# Patient Record
Sex: Male | Born: 1990
Health system: Southern US, Community
[De-identification: ages and names within clinical notes are randomized; demographics above are authoritative.]

## PROBLEM LIST (undated history)

## (undated) DIAGNOSIS — R002 Palpitations: Secondary | ICD-10-CM

## (undated) DIAGNOSIS — R110 Nausea: Secondary | ICD-10-CM

## (undated) DIAGNOSIS — F419 Anxiety disorder, unspecified: Secondary | ICD-10-CM

## (undated) DIAGNOSIS — I10 Essential (primary) hypertension: Secondary | ICD-10-CM

## (undated) DIAGNOSIS — J45909 Unspecified asthma, uncomplicated: Secondary | ICD-10-CM

## (undated) DIAGNOSIS — F32A Depression, unspecified: Secondary | ICD-10-CM

## (undated) DIAGNOSIS — R569 Unspecified convulsions: Secondary | ICD-10-CM

## (undated) DIAGNOSIS — F329 Major depressive disorder, single episode, unspecified: Secondary | ICD-10-CM

## (undated) HISTORY — DX: Morbid (severe) obesity due to excess calories: E66.01

## (undated) HISTORY — DX: Nausea: R11.0

## (undated) HISTORY — DX: Depression, unspecified: F32.A

## (undated) HISTORY — PX: TONSILLECTOMY AND ADENOIDECTOMY: SUR1326

## (undated) HISTORY — PX: CIRCUMCISION: SUR203

## (undated) HISTORY — DX: Major depressive disorder, single episode, unspecified: F32.9

## (undated) HISTORY — DX: Unspecified asthma, uncomplicated: J45.909

## (undated) HISTORY — DX: Essential (primary) hypertension: I10

---
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
## 2009-09-03 ENCOUNTER — Emergency Department (HOSPITAL_COMMUNITY): Admission: EM | Admit: 2009-09-03 | Discharge: 2009-09-03 | Payer: Self-pay | Admitting: Emergency Medicine

## 2012-05-12 DIAGNOSIS — W268XXA Contact with other sharp object(s), not elsewhere classified, initial encounter: Secondary | ICD-10-CM | POA: Insufficient documentation

## 2012-05-12 DIAGNOSIS — S91309A Unspecified open wound, unspecified foot, initial encounter: Secondary | ICD-10-CM | POA: Insufficient documentation

## 2012-05-13 ENCOUNTER — Emergency Department (HOSPITAL_COMMUNITY)
Admission: EM | Admit: 2012-05-13 | Discharge: 2012-05-13 | Disposition: A | Discharge status: Home / Self Care | Payer: Self-pay | Attending: Emergency Medicine | Admitting: Emergency Medicine

## 2012-05-13 ENCOUNTER — Encounter (HOSPITAL_COMMUNITY): Payer: Self-pay | Admitting: *Deleted

## 2012-05-13 DIAGNOSIS — S91339A Puncture wound without foreign body, unspecified foot, initial encounter: Secondary | ICD-10-CM

## 2012-05-13 MED ORDER — CIPROFLOXACIN HCL 500 MG PO TABS: 500.0000 mg | ORAL_TABLET | Freq: Two times a day (BID) | ORAL | Status: AC

## 2012-05-13 MED ORDER — CIPROFLOXACIN 500 MG/5ML (10%) PO SUSR: 500.0000 mg | Freq: Once | ORAL | Status: DC

## 2012-05-13 MED ORDER — BACITRACIN ZINC 500 UNIT/GM EX OINT
TOPICAL_OINTMENT | Freq: Once | CUTANEOUS | Status: AC
  Administered 2012-05-13: 01:00:00 via TOPICAL
  Filled 2012-05-13: qty 0.9

## 2012-05-13 MED ORDER — CIPROFLOXACIN HCL 250 MG PO TABS
500.0000 mg | ORAL_TABLET | Freq: Once | ORAL | Status: AC
  Administered 2012-05-13: 500 mg via ORAL
  Filled 2012-05-13: qty 2

## 2012-05-13 MED ORDER — TETANUS-DIPHTH-ACELL PERTUSSIS 5-2.5-18.5 LF-MCG/0.5 IM SUSP
0.5000 mL | Freq: Once | INTRAMUSCULAR | Status: AC
  Administered 2012-05-13: 0.5 mL via INTRAMUSCULAR
  Filled 2012-05-13: qty 0.5

## 2012-05-13 MED ORDER — IBUPROFEN 800 MG PO TABS: 800.0000 mg | ORAL_TABLET | Freq: Three times a day (TID) | ORAL | Status: AC

## 2012-05-13 NOTE — ED Provider Notes (Signed)
History     CSN: [id number]  Arrival date & time 05/12/12  2326   First MD Initiated Contact with Patient 05/13/12 0033      Chief Complaint  Patient presents with  . Puncture Wound    (Consider location/radiation/quality/duration/timing/severity/associated sxs/prior treatment) HPI History provided by patient. Tonight stepped on a rusty screw. He was wearing rubber is a tissues. Now complains of pain and swelling to bottom of his left foot. No foreign body sensation. It does not suspect retained foreign body. No numbness or tingling. Pain is sharp in quality. Nonradiating. Moderate in severity. No weakness or numbness. No other pain injury or trauma. No fevers chills. No nausea vomiting. No streaking redness. History reviewed. No pertinent past medical history.  History reviewed. No pertinent past surgical history.  History reviewed. No pertinent family history.  History  Substance Use Topics  . Smoking status: Never Smoker   . Smokeless tobacco: Not on file  . Alcohol Use: No      Review of Systems  Constitutional: Negative for fever and chills.  HENT: Negative for neck pain and neck stiffness.   Eyes: Negative for pain.  Respiratory: Negative for shortness of breath.   Cardiovascular: Negative for chest pain.  Gastrointestinal: Negative for abdominal pain.  Genitourinary: Negative for dysuria.  Musculoskeletal: Negative for back pain.  Skin: Positive for wound. Negative for rash.  Neurological: Negative for headaches.  All other systems reviewed and are negative.    Allergies  Review of patient's allergies indicates no known allergies.  Home Medications  No current outpatient prescriptions on file.  BP 191/103  Pulse 95  Temp 99.1 F (37.3 C) (Oral)  Resp 18  Ht 6\' 1"  (1.854 m)  Wt 300 lb (136.079 kg)  BMI 39.58 kg/m2  Physical Exam  Constitutional: He is oriented to person, place, and time. He appears well-developed and well-nourished.  HENT:  Head:  Normocephalic and atraumatic.  Eyes: Conjunctivae and EOM are normal. Pupils are equal, round, and reactive to light.  Neck: Trachea normal. Neck supple. No thyromegaly present.  Cardiovascular: Normal rate, regular rhythm, S1 normal, S2 normal and normal pulses.     No systolic murmur is present   No diastolic murmur is present  Pulses:      Radial pulses are 2+ on the right side, and 2+ on the left side.  Pulmonary/Chest: Effort normal and breath sounds normal. He has no wheezes. He has no rhonchi. He has no rales. He exhibits no tenderness.  Abdominal: Soft. Normal appearance and bowel sounds are normal. There is no tenderness. There is no CVA tenderness and negative Murphy's sign.  Musculoskeletal:       Left foot with puncture wound to arch of foot plantar aspect. There is no palpable or visualized foreign body. There is no streaking erythema. No purulent discharge. Distal neurovascular intact.  Neurological: He is alert and oriented to person, place, and time. He has normal strength. No cranial nerve deficit or sensory deficit. GCS eye subscore is 4. GCS verbal subscore is 5. GCS motor subscore is 6.  Skin: Skin is warm and dry. No rash noted. He is not diaphoretic.  Psychiatric: His speech is normal.       Cooperative and appropriate    ED Course  Procedures (including critical care time)  Puncture wound left foot.  Wound irrigated.   Tetanus updated. Bacitracin dressing applied. Cipro provided.Precautions verbalizes understood. Plan prescription for Cipro and followup 2 days for recheck.H. And notified his blood  pressure is elevated. Needs to be rechecked. Primary care referral provided.   MDM   Nursing notes reviewed. Old records reviewed. Vital signs reviewed.         Sunnie Nielsen, MD 05/13/12 [phone]

## 2012-05-13 NOTE — ED Notes (Signed)
Wound care completed. Pt tolerated well.

## 2012-05-13 NOTE — ED Notes (Signed)
Pt reports stepping on a nail previously this evening.  Reports edema began about 2 hours ago.

## 2013-04-01 ENCOUNTER — Encounter: Payer: Self-pay | Admitting: *Deleted

## 2013-07-08 ENCOUNTER — Ambulatory Visit: Payer: BC Managed Care – PPO

## 2013-07-08 ENCOUNTER — Ambulatory Visit: Payer: BC Managed Care – PPO | Admitting: Family Medicine

## 2013-07-08 VITALS — BP 142/84 | HR 114 | Temp 99.3°F | Resp 17 | Ht 71.5 in | Wt 366.0 lb

## 2013-07-08 DIAGNOSIS — J189 Pneumonia, unspecified organism: Secondary | ICD-10-CM

## 2013-07-08 DIAGNOSIS — R059 Cough, unspecified: Secondary | ICD-10-CM

## 2013-07-08 DIAGNOSIS — R05 Cough: Secondary | ICD-10-CM

## 2013-07-08 DIAGNOSIS — R0602 Shortness of breath: Secondary | ICD-10-CM

## 2013-07-08 DIAGNOSIS — R509 Fever, unspecified: Secondary | ICD-10-CM

## 2013-07-08 LAB — POCT CBC
Granulocyte percent: 62.8 %G (ref 37–80)
HCT, POC: 46.4 % (ref 43.5–53.7)
POC Granulocyte: 7.4 — AB (ref 2–6.9)
POC LYMPH PERCENT: 32 %L (ref 10–50)
Platelet Count, POC: 371 10*3/uL (ref 142–424)
RDW, POC: 13.7 %

## 2013-07-08 LAB — D-DIMER, QUANTITATIVE: D-Dimer, Quant: 0.39 ug/mL-FEU (ref 0.00–0.48)

## 2013-07-08 MED ORDER — LEVOFLOXACIN 500 MG PO TABS: 500.0000 mg | ORAL_TABLET | Freq: Every day | ORAL | Status: DC

## 2013-07-08 MED ORDER — IBUPROFEN 200 MG PO TABS: 400.0000 mg | ORAL_TABLET | Freq: Once | ORAL | Status: DC

## 2013-07-08 NOTE — Progress Notes (Signed)
[facility] [address], [place] [address] [phone]  Date:  07/08/2013   Name:  Omar Krause   DOB:  March 27, 1991   MRN:  [id number]  PCP:  No PCP Per Patient    Chief Complaint: Nasal Congestion and coughing up blood   History of Present Illness:  Omar Krause is a 22 y.o. very pleasant male patient who presents with the following:  He has noted a cough for about 10 days.  He is coughing up some mucus, had "cold like sx" when this started.  He now just had a cough, and has tasted blood when he coughs lately.  He has not seen any gross blood. He had not noted a fever but has noted sweating.   He did have flu like sx last week, but these seem to have passed.  He is having a hard time taking a deep breath since earlier today.  He did get worse today.  He had thought he was on the mend.    He is otherwise healthy- he did have asthma as a child but this no longer bothers him.   He was seen elsewhere and was tx with amoxicillin and a decongestant on 9/10- at that time he was dx with an ear infection.    He did take his amox and tylenol this am.   Student at [employer].   He has never been a smoker, no history of cocaine use, no history of CAD.  He does not have a family history of CAD, although his father does have HTN.  No history of PE  There are no active problems to display for this patient.   Past Medical History  Diagnosis Date  . Asthma   . Morbid obesity     Past Surgical History  Procedure Laterality Date  . Tonsillectomy and adenoidectomy      History  Substance Use Topics  . Smoking status: Never Smoker   . Smokeless tobacco: Not on file  . Alcohol Use: No    No family history on file.  Allergies  Allergen Reactions  . Ceftin [Cefuroxime Axetil]     Medication list has been reviewed and updated.  No current outpatient prescriptions on file prior to visit.   No current facility-administered medications on file prior to  visit.    Review of Systems:  As per HPI- otherwise negative.   Physical Examination: Filed Vitals:   07/08/13 0903  BP: 142/84  Pulse: 114  Temp: 99.3 F (37.4 C)  Resp: 17   Filed Vitals:   07/08/13 0903  Height: 5' 11.5" (1.816 m)  Weight: 366 lb (166.017 kg)   Body mass index is 50.34 kg/(m^2). Ideal Body Weight: Weight in (lb) to have BMI = 25: 181.4  GEN: WDWN, NAD, Non-toxic, A & O x 3, obese, sweating heavily HEENT: Atraumatic, Normocephalic. Neck supple. No masses, No LAD.  Bilateral TM wnl, oropharynx normal.  PEERL,EOMI.   Ears and Nose: No external deformity. CV: RRR, No M/G/R. No JVD. No thrill. No extra heart sounds. PULM: CTA B, no wheezes, crackles, rhonchi. No retractions. No resp. distress. No accessory muscle use. ABD: S, NT, ND. No rebound. No HSM. EXTR: No c/c/e NEURO Normal gait.  PSYCH: Normally interactive. Conversant. Not depressed or anxious appearing.  Calm demeanor.   [facility] reading (PRIMARY) by  Dr. Patsy Lager. CXR:  Right sided diffuse infiltrate  CHEST 2 VIEW  COMPARISON: PA and lateral chest 09/03/2009  and 12/21/2006.  FINDINGS: On the PA view, mild, patchy airspace opacity projects in the right mid lung zone. This is not seen on the lateral view. The left lung is clear. No pneumothorax or pleural fluid. Heart size is normal.  IMPRESSION: Mild, patchy airspace opacity in the right mid lung zone could be due to early pneumonia. Recommend followup films to clearing.  Results for orders placed in visit on 07/08/13  POCT CBC      Result Value Range   WBC 11.8 (*) 4.6 - 10.2 K/uL   Lymph, poc 3.8 (*) 0.6 - 3.4   POC LYMPH PERCENT 32.0  10 - 50 %L   MID (cbc) 0.6  0 - 0.9   POC MID % 5.2  0 - 12 %M   POC Granulocyte 7.4 (*) 2 - 6.9   Granulocyte percent 62.8  37 - 80 %G   RBC 5.08  4.69 - 6.13 M/uL   Hemoglobin 15.0  14.1 - 18.1 g/dL   HCT, POC 16.1  09.6 - 53.7 %   MCV 91.3  80 - 97 fL   MCH, POC 29.5  27 - 31.2 pg   MCHC 32.3   31.8 - 35.4 g/dL   RDW, POC 04.5     Platelet Count, POC 371  142 - 424 K/uL   MPV 8.3  0 - 99.8 fL   EKG:  NSR, tachycardia resolved.    Given 400mg  of ibuprofen.  Temp to 98.6, pulse in the 70's.  Sweating resolved Assessment and Plan: Cough - Plan: DG Chest 2 View  Fever, unspecified - Plan: POCT CBC, ibuprofen (ADVIL,MOTRIN) tablet 400 mg  Shortness of breath - Plan: DG Chest 2 View, EKG 12-Lead, D-dimer, quantitative  CAP (community acquired pneumonia) - Plan: levofloxacin (LEVAQUIN) 500 MG tablet, DG Chest 2 View  Here today with CAP.  Will also check a D dimer to rule- out PE given his SOB.  Treat with levaquin for 10 days, d/c amox.  Called and LMOM that d dimer is negative.  He will let me know if not better soon Ordered a repeat CXR for one month from now at [facility], which is near his home,    Signed Abbe Amsterdam, MD  Results for orders placed in visit on 07/08/13  D-DIMER, QUANTITATIVE      Result Value Range   D-Dimer, Quant 0.39  0.00 - 0.48 ug/mL-FEU  POCT CBC      Result Value Range   WBC 11.8 (*) 4.6 - 10.2 K/uL   Lymph, poc 3.8 (*) 0.6 - 3.4   POC LYMPH PERCENT 32.0  10 - 50 %L   MID (cbc) 0.6  0 - 0.9   POC MID % 5.2  0 - 12 %M   POC Granulocyte 7.4 (*) 2 - 6.9   Granulocyte percent 62.8  37 - 80 %G   RBC 5.08  4.69 - 6.13 M/uL   Hemoglobin 15.0  14.1 - 18.1 g/dL   HCT, POC 40.9  81.1 - 53.7 %   MCV 91.3  80 - 97 fL   MCH, POC 29.5  27 - 31.2 pg   MCHC 32.3  31.8 - 35.4 g/dL   RDW, POC 91.4     Platelet Count, POC 371  142 - 424 K/uL   MPV 8.3  0 - 99.8 fL

## 2013-07-08 NOTE — Patient Instructions (Addendum)
Let me know if you are not better in the next few days- Sooner if worse.   Keep an eye on your temperature. If you are running a fever please let me know I will call you regarding your D dimer test.    The radiologist did recommend that you have a follow-up CXR.  I will order this for you

## 2013-07-12 ENCOUNTER — Emergency Department (HOSPITAL_COMMUNITY): Payer: BC Managed Care – PPO

## 2013-07-12 ENCOUNTER — Emergency Department (HOSPITAL_COMMUNITY)
Admission: EM | Admit: 2013-07-12 | Discharge: 2013-07-12 | Disposition: A | Discharge status: Home / Self Care | Payer: BC Managed Care – PPO | Attending: Emergency Medicine | Admitting: Emergency Medicine

## 2013-07-12 ENCOUNTER — Encounter (HOSPITAL_COMMUNITY): Payer: Self-pay | Admitting: *Deleted

## 2013-07-12 DIAGNOSIS — J45909 Unspecified asthma, uncomplicated: Secondary | ICD-10-CM | POA: Insufficient documentation

## 2013-07-12 DIAGNOSIS — J189 Pneumonia, unspecified organism: Secondary | ICD-10-CM

## 2013-07-12 DIAGNOSIS — R51 Headache: Secondary | ICD-10-CM | POA: Insufficient documentation

## 2013-07-12 DIAGNOSIS — Z79899 Other long term (current) drug therapy: Secondary | ICD-10-CM | POA: Insufficient documentation

## 2013-07-12 DIAGNOSIS — J159 Unspecified bacterial pneumonia: Secondary | ICD-10-CM | POA: Insufficient documentation

## 2013-07-12 LAB — COMPREHENSIVE METABOLIC PANEL
ALT: 43 U/L (ref 0–53)
AST: 29 U/L (ref 0–37)
CO2: 24 mEq/L (ref 19–32)
Chloride: 101 mEq/L (ref 96–112)
GFR calc non Af Amer: >90 mL/min (ref >90)
Sodium: 136 mEq/L (ref 135–145)
Total Bilirubin: 0.2 mg/dL — ABNORMAL LOW (ref 0.3–1.2)

## 2013-07-12 LAB — CBC WITH DIFFERENTIAL/PLATELET
Basophils Absolute: 0 10*3/uL (ref 0.0–0.1)
HCT: 40.9 % (ref 39.0–52.0)
Lymphocytes Relative: 33 % (ref 12–46)
Neutro Abs: 7.4 10*3/uL (ref 1.7–7.7)
Neutrophils Relative %: 61 % (ref 43–77)
Platelets: 330 10*3/uL (ref 150–400)
RDW: 13.1 % (ref 11.5–15.5)
WBC: 12.3 10*3/uL — ABNORMAL HIGH (ref 4.0–10.5)

## 2013-07-12 MED ORDER — SODIUM CHLORIDE 0.9 % IV BOLUS (SEPSIS)
1000.0000 mL | Freq: Once | INTRAVENOUS | Status: AC
  Administered 2013-07-12: 1000 mL via INTRAVENOUS

## 2013-07-12 NOTE — ED Notes (Addendum)
Went to urgent care in [place] for cold/flu symptoms and was told he had pneumonia. Pt was instructed to come back in a few days if he wasn't feeling any better and the urgent care is closed today. Pt c/o headache and cough. Pt states he is stressed.

## 2013-07-12 NOTE — ED Provider Notes (Signed)
CSN: [id number]     Arrival date & time 07/12/13  1829 History   First MD Initiated Contact with Patient 07/12/13 1917     Chief Complaint  Patient presents with  . Headache  . Cough   (Consider location/radiation/quality/duration/timing/severity/associated sxs/prior Treatment) Patient is a 22 y.o. male presenting with cough. The history is provided by the patient (pt continues with cough.  has pneumonia.  txed since wed.).  Cough Cough characteristics:  Productive Sputum characteristics:  Nondescript Severity:  Moderate Onset quality:  Gradual Timing:  Constant Progression:  Waxing and waning Associated symptoms: no chest pain, no eye discharge, no headaches and no rash     Past Medical History  Diagnosis Date  . Asthma   . Morbid obesity    Past Surgical History  Procedure Laterality Date  . Tonsillectomy and adenoidectomy     No family history on file. History  Substance Use Topics  . Smoking status: Never Smoker   . Smokeless tobacco: Not on file  . Alcohol Use: No    Review of Systems  Constitutional: Negative for appetite change and fatigue.  HENT: Negative for congestion, sinus pressure and ear discharge.   Eyes: Negative for discharge.  Respiratory: Positive for cough.   Cardiovascular: Negative for chest pain.  Gastrointestinal: Negative for abdominal pain and diarrhea.  Genitourinary: Negative for frequency and hematuria.  Musculoskeletal: Negative for back pain.  Skin: Negative for rash.  Neurological: Negative for seizures and headaches.  Psychiatric/Behavioral: Negative for hallucinations.    Allergies  Ceftin  Home Medications   Current Outpatient Rx  Name  Route  Sig  Dispense  Refill  . esomeprazole (NEXIUM) 40 MG capsule   Oral   Take 40 mg by mouth daily before breakfast.         . levofloxacin (LEVAQUIN) 500 MG tablet   Oral   Take 1 tablet (500 mg total) by mouth daily.   10 tablet   0    BP 157/82  Pulse 89  Temp(Src) 98.7  F (37.1 C) (Oral)  Resp 24  Ht 6' (1.829 m)  Wt 350 lb (158.759 kg)  BMI 47.46 kg/m2  SpO2 99% Physical Exam  Constitutional: He is oriented to person, place, and time. He appears well-developed.  HENT:  Head: Normocephalic.  Eyes: Conjunctivae and EOM are normal. No scleral icterus.  Neck: Neck supple. No thyromegaly present.  Cardiovascular: Normal rate and regular rhythm.  Exam reveals no gallop and no friction rub.   No murmur heard. Pulmonary/Chest: No stridor. He has no wheezes. He has no rales. He exhibits no tenderness.  Abdominal: He exhibits no distension. There is no tenderness. There is no rebound.  Musculoskeletal: Normal range of motion. He exhibits no edema.  Lymphadenopathy:    He has no cervical adenopathy.  Neurological: He is oriented to person, place, and time. Coordination normal.  Skin: No rash noted. No erythema.  Psychiatric: He has a normal mood and affect. His behavior is normal.    ED Course  Procedures (including critical care time) Labs Review Labs Reviewed  CBC WITH DIFFERENTIAL - Abnormal; Notable for the following:    WBC 12.3 (*)    All other components within normal limits  COMPREHENSIVE METABOLIC PANEL - Abnormal; Notable for the following:    Total Bilirubin 0.2 (*)    All other components within normal limits   Imaging Review Dg Chest 2 View  07/12/2013   CLINICAL DATA:  Cough and headache  EXAM: CHEST  2 VIEW  COMPARISON:  July 08, 2013  FINDINGS: The area of subtle infiltrate on the right has cleared. Currently, lungs are clear. Heart size and pulmonary vascularity are normal. No adenopathy. No bone lesions.  IMPRESSION: No edema or consolidation.   Electronically Signed   By: Bretta Bang   On: 07/12/2013 19:31    MDM  Chest x-ray improving. 1. Community acquired pneumonia       Benny Lennert, MD 07/12/13 2110

## 2013-07-13 ENCOUNTER — Telehealth: Payer: Self-pay

## 2013-07-13 DIAGNOSIS — R05 Cough: Secondary | ICD-10-CM

## 2013-07-13 DIAGNOSIS — R059 Cough, unspecified: Secondary | ICD-10-CM

## 2013-07-13 MED ORDER — HYDROCODONE-HOMATROPINE 5-1.5 MG/5ML PO SYRP: 5.0000 mL | ORAL_SOLUTION | Freq: Three times a day (TID) | ORAL | Status: DC | PRN

## 2013-07-13 NOTE — Telephone Encounter (Signed)
Reviewed ED note from yesterday.  Pneumonia improving.  Called but had to [facility].  I assume he would like something to help with sleep as we discussed at our visit (at that time he declined rx).  Will call in hycodan- avoid driving due to sedation.  Let me know if not getting better

## 2013-07-13 NOTE — Telephone Encounter (Signed)
Pt of Dr.Copland, would like a cough syrup called in to the [facility] in [place]. Call at [phone].

## 2013-07-18 ENCOUNTER — Telehealth: Payer: Self-pay

## 2013-07-18 NOTE — Telephone Encounter (Signed)
Patient has completed antibiotics.  His throat feels swollen and it burns - wonders if he needs another round of medication.   [facility] [place] [facility]   [phone]

## 2013-07-19 ENCOUNTER — Ambulatory Visit: Payer: BC Managed Care – PPO | Admitting: Emergency Medicine

## 2013-07-19 ENCOUNTER — Encounter: Payer: Self-pay | Admitting: Emergency Medicine

## 2013-07-19 VITALS — BP 144/90 | HR 90 | Temp 97.6°F | Resp 22 | Ht 71.0 in | Wt 350.8 lb

## 2013-07-19 DIAGNOSIS — J309 Allergic rhinitis, unspecified: Secondary | ICD-10-CM

## 2013-07-19 DIAGNOSIS — G4733 Obstructive sleep apnea (adult) (pediatric): Secondary | ICD-10-CM

## 2013-07-19 DIAGNOSIS — J209 Acute bronchitis, unspecified: Secondary | ICD-10-CM

## 2013-07-19 MED ORDER — TRIAMCINOLONE ACETONIDE(NASAL) 55 MCG/ACT NA INHA: 2.0000 | Freq: Every day | NASAL | Status: DC

## 2013-07-19 MED ORDER — PSEUDOEPHEDRINE-GUAIFENESIN ER 60-600 MG PO TB12: 1.0000 | ORAL_TABLET | Freq: Two times a day (BID) | ORAL | Status: DC

## 2013-07-19 MED ORDER — HYDROCOD POLST-CHLORPHEN POLST 10-8 MG/5ML PO LQCR: 5.0000 mL | Freq: Two times a day (BID) | ORAL | Status: DC | PRN

## 2013-07-19 NOTE — Telephone Encounter (Signed)
Recheck.

## 2013-07-19 NOTE — Telephone Encounter (Signed)
We saw patient for pneumonia - if he is still having problems after his abx he needs a recheck.

## 2013-07-19 NOTE — Telephone Encounter (Signed)
Spoke with pt advised to RTC.

## 2013-07-19 NOTE — Progress Notes (Signed)
[facility] [address] [place] [address] [phone]  Date:  07/19/2013   Name:  Omar Krause   DOB:  20-Aug-1991   MRN:  [id number]  PCP:  No PCP Per Patient    Chief Complaint: Follow-up   History of Present Illness:  Omar Krause is a 22 y.o. very pleasant male patient who presents with the following:  Seen by Dr Patsy Lager and treated for [employer] after he came in with cough and some hemoptysis.  Following he wen to the ER for treatment and was given 2 liters of fluid for "dehydration".  No radiographic evidence for pneumonia.  No fever or chills.  Now to office with headache and feels "bad".  Has no wheezing or shortness of breath. No nausea or vomiting.  No nasal drainage but congestion.  Cough is not productive.  No sore throat. No further hemoptysis.  Denies other complaint or health concern today.  Never told that he snores.  Sleepy and fatigued on arising and has excessive daytime sleepiness.  Patient Active Problem List   Diagnosis Date Noted  . Morbid obesity 07/08/2013    Past Medical History  Diagnosis Date  . Asthma   . Morbid obesity     Past Surgical History  Procedure Laterality Date  . Tonsillectomy and adenoidectomy      History  Substance Use Topics  . Smoking status: Never Smoker   . Smokeless tobacco: Not on file  . Alcohol Use: No    History reviewed. No pertinent family history.  Allergies  Allergen Reactions  . Ceftin [Cefuroxime Axetil] Itching and Other (See Comments)    Medication list has been reviewed and updated.  Current Outpatient Prescriptions on File Prior to Visit  Medication Sig Dispense Refill  . esomeprazole (NEXIUM) 40 MG capsule Take 40 mg by mouth daily before breakfast.      . HYDROcodone-homatropine (HYCODAN) 5-1.5 MG/5ML syrup Take 5 mLs by mouth every 8 (eight) hours as needed for cough.  90 mL  0  . levofloxacin (LEVAQUIN) 500 MG tablet Take 1 tablet (500 mg total) by mouth daily.  10 tablet   0   Current Facility-Administered Medications on File Prior to Visit  Medication Dose Route Frequency Provider Last Rate Last Dose  . ibuprofen (ADVIL,MOTRIN) tablet 400 mg  400 mg Oral Once Pearline Cables, MD        Review of Systems:  As per HPI, otherwise negative.    Physical Examination: Filed Vitals:   07/19/13 1548  BP: 144/90  Pulse: 90  Temp: 97.6 F (36.4 C)  Resp: 22   Filed Vitals:   07/19/13 1548  Height: 5\' 11"  (1.803 m)  Weight: 350 lb 12.8 oz (159.122 kg)   Body mass index is 48.95 kg/(m^2). Ideal Body Weight: Weight in (lb) to have BMI = 25: 178.9  GEN: morbidly obese, NAD, Non-toxic, A & O x 3  Sweating.   HEENT: Atraumatic, Normocephalic. Neck supple. No masses, No LAD. Ears and Nose: No external deformity. CV: RRR, No M/G/R. No JVD. No thrill. No extra heart sounds. PULM: CTA B, no wheezes, crackles, rhonchi. No retractions. No resp. distress. No accessory muscle use. ABD: S, NT, ND, +BS. No rebound. No HSM. EXTR: No c/c/e NEURO Normal gait.  PSYCH: Normally interactive. Conversant. Not depressed or anxious appearing.  Calm demeanor.    Assessment and Plan: Bronchitis Seasonal allergic rhinitis Obstructive sleep apnea  Signed,  Phillips Odor, MD

## 2013-07-19 NOTE — Patient Instructions (Addendum)

## 2014-01-19 ENCOUNTER — Ambulatory Visit: Payer: BC Managed Care – PPO

## 2014-01-19 ENCOUNTER — Ambulatory Visit (INDEPENDENT_AMBULATORY_CARE_PROVIDER_SITE_OTHER): Payer: BC Managed Care – PPO | Admitting: Family Medicine

## 2014-01-19 VITALS — BP 160/100 | HR 93 | Temp 98.0°F | Resp 20 | Ht 71.75 in | Wt 365.0 lb

## 2014-01-19 DIAGNOSIS — R059 Cough, unspecified: Secondary | ICD-10-CM

## 2014-01-19 DIAGNOSIS — J029 Acute pharyngitis, unspecified: Secondary | ICD-10-CM

## 2014-01-19 DIAGNOSIS — R05 Cough: Secondary | ICD-10-CM

## 2014-01-19 DIAGNOSIS — J020 Streptococcal pharyngitis: Secondary | ICD-10-CM

## 2014-01-19 LAB — POCT RAPID STREP A (OFFICE): RAPID STREP A SCREEN: POSITIVE — AB

## 2014-01-19 MED ORDER — AZITHROMYCIN 250 MG PO TABS: ORAL_TABLET | ORAL | Status: DC

## 2014-01-19 NOTE — Progress Notes (Signed)
[facility] [address], [place] [id number] [phone]  Date:  01/19/2014   Name:  Omar Krause   DOB:  May 28, 1991   MRN:  [id number]  PCP:  No PCP Per Patient    Chief Complaint: Cough and Chest Congestion   History of Present Illness:  Omar Krause is a 23 y.o. very pleasant male patient who presents with the following:  Had cold symptoms last week that resolved but this morning had recurrent cough, nasal congestion and sore throat. Cough is productive of white phlegm. He has had nasal congestion making it difficult to breathe just through his nose. He denies any shortness of breath. He denies any fevers but has been having chills. No sick contacts. Take alka seltzer flu which has not helped much. He is eating and drinking well. No myalgias. No headache.  Patient was diagnosed with pneumonia in sep 2014 and feels like his current symptoms are similar to then.   Patient Active Problem List   Diagnosis Date Noted  . Morbid obesity 07/08/2013    Past Medical History  Diagnosis Date  . Asthma   . Morbid obesity     Past Surgical History  Procedure Laterality Date  . Tonsillectomy and adenoidectomy      History  Substance Use Topics  . Smoking status: Never Smoker   . Smokeless tobacco: Not on file  . Alcohol Use: No    History reviewed. No pertinent family history.  Allergies  Allergen Reactions  . Ceftin [Cefuroxime Axetil] Itching and Other (See Comments)    Medication list has been reviewed and updated.  Current Outpatient Prescriptions on File Prior to Visit  Medication Sig Dispense Refill  . chlorpheniramine-HYDROcodone (TUSSIONEX PENNKINETIC ER) 10-8 MG/5ML LQCR Take 5 mLs by mouth every 12 (twelve) hours as needed.  60 mL  0  . esomeprazole (NEXIUM) 40 MG capsule Take 40 mg by mouth daily before breakfast.      . HYDROcodone-homatropine (HYCODAN) 5-1.5 MG/5ML syrup Take 5 mLs by mouth every 8 (eight) hours as needed for  cough.  90 mL  0  . levofloxacin (LEVAQUIN) 500 MG tablet Take 1 tablet (500 mg total) by mouth daily.  10 tablet  0  . pseudoephedrine-guaifenesin (MUCINEX D) 60-600 MG per tablet Take 1 tablet by mouth every 12 (twelve) hours.  18 tablet  0  . triamcinolone (NASACORT AQ) 55 MCG/ACT nasal inhaler Place 2 sprays into the nose daily.  1 Inhaler  12   Current Facility-Administered Medications on File Prior to Visit  Medication Dose Route Frequency Provider Last Rate Last Dose  . ibuprofen (ADVIL,MOTRIN) tablet 400 mg  400 mg Oral Once Pearline CablesJessica C Copland, MD        Review of Systems:  Review of Systems - General ROS: positive for chills. No myalgias, no documented fevers.  ENT ROS: positive for - nasal congestion and nasal discharge, no headache, no ear pain Respiratory ROS: positive for - cough and wheezing, negative for shortness of breath Cardiovascular ROS: no chest pain or dyspnea on exertion Gastrointestinal ROS: no abdominal pain, change in bowel habits, or black or bloody stools Dermatological ROS: negative   Physical Examination: Filed Vitals:   01/19/14 1025  BP: 168/92  Pulse: 93  Temp: 98 F (36.7 C)  Resp: 20   Filed Vitals:   01/19/14 1025  Height: 5' 11.75" (1.822 m)  Weight: 365 lb (165.563 kg)   Body mass index is 49.87 kg/(m^2). Ideal  Body Weight: Weight in (lb) to have BMI = 25: 182.7  Physical Examination: General appearance - alert, well appearing, and in no distress, obese Eyes - pupils equal and reactive, extraocular eye movements intact Ears - right TM red and inflamed with some bulging, left TM normal Nose - erythematous and congested nasal turbinates bilaterally, no sinus tenderness Mouth - moist mucous membranes, no tonsilar exudates, post nasal drip changes in posterior oropharynx Neck - supple, no significant adenopathy Chest - normal work of breathing, scattered crackles in lower bases, no wheezing Heart - normal rate, regular rhythm, normal S1,  S2, no murmurs, rubs, clicks or gallops Abdomen - soft, nontender, nondistended, no masses or organomegaly Skin - normal coloration and turgor, no rashes, no suspicious skin lesions noted  [facility] reading (PRIMARY) by  Dr. Patsy Lager. CXR: negative  CHEST 2 VIEW  COMPARISON: 07/12/2013  FINDINGS: The heart size and mediastinal contours are within normal limits. Both lungs are clear. The visualized skeletal structures are unremarkable.  IMPRESSION: Normal chest radiographs.  Results for orders placed in visit on 01/19/14  POCT RAPID STREP A (OFFICE)      Result Value Ref Range   Rapid Strep A Screen Positive (*) Negative    Assessment and Plan: Streptococcal sore throat - Plan: azithromycin (ZITHROMAX) 250 MG tablet  Cough - Plan: DG Chest 2 View  Acute pharyngitis - Plan: POCT rapid strep A   23 yo morbidly obese male who presents with cough, sore throat and congestion for one day.  - otitis media: exam consistent with otitis media of right TM.  - sore throat: strep test positive. Will treat with azithromycin for strep and otitis media given patient's allergy to ceftin.  - cough and congestion: CXR negative for PNA. - HTN: patient hesitant to start meds at this time.  He is able to check his BP at home. Asked him to monitor BP a few times a week over the next few weeks. Patient to call if BP greater than 140/85. If persistently high, would start lisinopril.    Marena Chancy, PGY-3 Family Medicine Resident  Signed Abbe Amsterdam, MD

## 2014-01-19 NOTE — Patient Instructions (Signed)
We are going to treat you for strep throat with azithromycin- if you do not feel better in the next couple of days please let me know.    Please check you BP at home; give me a call in a couple of weeks with an update.  We want you to run around 130/85 of less, if you continue to run higher we will need to think about medication.

## 2014-01-25 ENCOUNTER — Telehealth: Payer: Self-pay

## 2014-01-25 NOTE — Telephone Encounter (Signed)
PT STATES HIS THROAT IS FEELING BETTER BUT NOW HIS EARS ARE SO STOPPED UP, DIDN'T KNOW IF HE NEEDED SOMETHING CALLED IN  PLEASE CALL PT AT [phone]    [facility] [place]

## 2014-01-26 MED ORDER — FLUTICASONE PROPIONATE 50 MCG/ACT NA SUSP: 2.0000 | Freq: Every day | NASAL | Status: DC

## 2014-01-26 MED ORDER — PSEUDOEPHEDRINE HCL 60 MG PO TABS: 60.0000 mg | ORAL_TABLET | Freq: Four times a day (QID) | ORAL | Status: DC | PRN

## 2014-01-26 NOTE — Telephone Encounter (Signed)
What can he take for his ears

## 2014-01-26 NOTE — Telephone Encounter (Signed)
i have sent sudafed and flonase to his pharmacy to help with ear pressure

## 2014-01-27 NOTE — Telephone Encounter (Signed)
Spoke with pt and told him the rx's were at the pharmayc

## 2014-01-27 NOTE — Telephone Encounter (Signed)
Called pt and voicemail is not set up so could not leave message to call back

## 2014-02-01 ENCOUNTER — Telehealth: Payer: Self-pay

## 2014-02-01 NOTE — Telephone Encounter (Signed)
Left message for call back Non-identifiable   Tdap- 05/13/2012

## 2014-02-02 ENCOUNTER — Ambulatory Visit (INDEPENDENT_AMBULATORY_CARE_PROVIDER_SITE_OTHER): Payer: BC Managed Care – PPO | Admitting: Family Medicine

## 2014-02-02 ENCOUNTER — Encounter: Payer: Self-pay | Admitting: Family Medicine

## 2014-02-02 DIAGNOSIS — R110 Nausea: Secondary | ICD-10-CM

## 2014-02-02 DIAGNOSIS — I10 Essential (primary) hypertension: Secondary | ICD-10-CM | POA: Insufficient documentation

## 2014-02-02 DIAGNOSIS — R112 Nausea with vomiting, unspecified: Secondary | ICD-10-CM | POA: Insufficient documentation

## 2014-02-02 DIAGNOSIS — F411 Generalized anxiety disorder: Secondary | ICD-10-CM

## 2014-02-02 LAB — CBC WITH DIFFERENTIAL/PLATELET
BASOS ABS: 0.4 10*3/uL — AB (ref 0.0–0.1)
Basophils Relative: 3.6 % — ABNORMAL HIGH (ref 0.0–3.0)
EOS ABS: 0.2 10*3/uL (ref 0.0–0.7)
Eosinophils Relative: 1.9 % (ref 0.0–5.0)
HCT: 42.9 % (ref 39.0–52.0)
Hemoglobin: 14.5 g/dL (ref 13.0–17.0)
LYMPHS PCT: 38 % (ref 12.0–46.0)
Lymphs Abs: 4.1 10*3/uL — ABNORMAL HIGH (ref 0.7–4.0)
MCHC: 33.8 g/dL (ref 30.0–36.0)
MCV: 86.3 fl (ref 78.0–100.0)
Monocytes Absolute: 0.6 10*3/uL (ref 0.1–1.0)
Monocytes Relative: 5.2 % (ref 3.0–12.0)
Neutro Abs: 5.6 10*3/uL (ref 1.4–7.7)
Neutrophils Relative %: 51.3 % (ref 43.0–77.0)
Platelets: 350 10*3/uL (ref 150.0–400.0)
RBC: 4.97 Mil/uL (ref 4.22–5.81)
RDW: 13.4 % (ref 11.5–14.6)
WBC: 10.9 10*3/uL — ABNORMAL HIGH (ref 4.5–10.5)

## 2014-02-02 LAB — HEMOGLOBIN A1C: HEMOGLOBIN A1C: 5.8 % (ref 4.6–6.5)

## 2014-02-02 LAB — HEPATIC FUNCTION PANEL
ALK PHOS: 64 U/L (ref 39–117)
ALT: 48 U/L (ref 0–53)
AST: 32 U/L (ref 0–37)
Albumin: 4.1 g/dL (ref 3.5–5.2)
BILIRUBIN DIRECT: 0 mg/dL (ref 0.0–0.3)
TOTAL PROTEIN: 7.8 g/dL (ref 6.0–8.3)
Total Bilirubin: 0.3 mg/dL (ref 0.3–1.2)

## 2014-02-02 LAB — LIPID PANEL
CHOLESTEROL: 191 mg/dL (ref 0–200)
HDL: 26.4 mg/dL — AB (ref >39.00)
LDL Cholesterol: 125 mg/dL — ABNORMAL HIGH (ref 0–99)
Total CHOL/HDL Ratio: 7
Triglycerides: 198 mg/dL — ABNORMAL HIGH (ref 0.0–149.0)
VLDL: 39.6 mg/dL (ref 0.0–40.0)

## 2014-02-02 LAB — BASIC METABOLIC PANEL
BUN: 9 mg/dL (ref 6–23)
CO2: 25 meq/L (ref 19–32)
Calcium: 9.2 mg/dL (ref 8.4–10.5)
Chloride: 104 mEq/L (ref 96–112)
Creatinine, Ser: 0.7 mg/dL (ref 0.4–1.5)
GFR: 145.38 mL/min (ref >60.00)
Glucose, Bld: 67 mg/dL — ABNORMAL LOW (ref 70–99)
Potassium: 3.4 mEq/L — ABNORMAL LOW (ref 3.5–5.1)
SODIUM: 138 meq/L (ref 135–145)

## 2014-02-02 LAB — TSH: TSH: 0.8 u[IU]/mL (ref 0.35–5.50)

## 2014-02-02 LAB — H. PYLORI ANTIBODY, IGG: H Pylori IgG: NEGATIVE

## 2014-02-02 MED ORDER — FLUOXETINE HCL 20 MG PO TABS: 20.0000 mg | ORAL_TABLET | Freq: Every day | ORAL | Status: DC

## 2014-02-02 MED ORDER — LOSARTAN POTASSIUM 50 MG PO TABS: 50.0000 mg | ORAL_TABLET | Freq: Every day | ORAL | Status: DC

## 2014-02-02 MED ORDER — PANTOPRAZOLE SODIUM 40 MG PO TBEC: 40.0000 mg | DELAYED_RELEASE_TABLET | Freq: Every day | ORAL | Status: DC

## 2014-02-02 NOTE — Assessment & Plan Note (Signed)
New to provider, ongoing for pt.  Stressed need for healthy diet and regular exercise to lose weight.  Pt is not interested or concerned at this time.  Check labs to risk stratify.  Will follow.

## 2014-02-02 NOTE — Assessment & Plan Note (Signed)
New.  This is pt's 2nd elevated reading.  + family hx.  Asymptomatic.  Check labs.  Start BP meds daily.  Reviewed supportive care and red flags that should prompt return.  Pt expressed understanding and is in agreement w/ plan.

## 2014-02-02 NOTE — Assessment & Plan Note (Signed)
New to provider, ongoing for pt.  Suspect there is a component of GERD.  Start PPI.  Check for H pylori and tx prn.  Reviewed supportive care and red flags that should prompt return.  Pt expressed understanding and is in agreement w/ plan.

## 2014-02-02 NOTE — Patient Instructions (Signed)
Follow up in 3-4 weeks to recheck BP and mood Start the Losartan daily for blood pressure- this is most important In 2 days, start the Protonix daily for the reflux 2 days after you start the Protonix (4 days after the Losartan), start the Prozac for the anxiety We'll notify you of your lab results and make any changes if needed Please try and get regular exercise and make healthy food choices Consider starting counseling for the anger Call with any questions or concerns Hang in there!

## 2014-02-02 NOTE — Progress Notes (Signed)
   Subjective:    Patient ID: Omar Krause, male    DOB: 09-10-1991, 23 y.o.   MRN: [id number]  HPI New to establish.  Previous MD- [facility]  HTN- new dx.  Has never been on medication.  Father also HTN.  No CP, SOB, HAs, visual changes.  Nausea- occurs after eating, almost immediate.  Not dependent on type or amount of food.  sxs started at age 23 (4 yrs ago).  Did a trial of Nexium last year w/o relief.  No diarrhea, just nausea.  No abdominal pain.  + GERD.  Obesity- BMI 49.66.  No formal exercise but walking across campus daily.  Pt denies stress eating, not following particular diet.  When asked if worried about his weight 'a little'.  Anxiety- 'a lot of anxiety'.  Denies depression.  Pt reports father is source of anxiety.  'he's terrible.  There aren't words to describe what he is'.  Pt reports difficulty sleeping.   Review of Systems For ROS see HPI     Objective:   Physical Exam  Vitals reviewed. Constitutional: He is oriented to person, place, and time. He appears well-developed and well-nourished. No distress.  Morbidly obese, smells of BO  HENT:  Head: Normocephalic and atraumatic.  sweaty  Eyes: Conjunctivae and EOM are normal. Pupils are equal, round, and reactive to light.  Neck: Normal range of motion. Neck supple. No thyromegaly present.  Cardiovascular: Normal rate, regular rhythm, normal heart sounds and intact distal pulses.   No murmur heard. Pulmonary/Chest: Effort normal and breath sounds normal. No respiratory distress.  Abdominal: Soft. Bowel sounds are normal. He exhibits no distension.  Musculoskeletal: He exhibits no edema.  Lymphadenopathy:    He has no cervical adenopathy.  Neurological: He is alert and oriented to person, place, and time. No cranial nerve deficit.  Skin: Skin is warm and dry.  Psychiatric: His behavior is normal.  Clearly angry when discussing his father          Assessment & Plan:

## 2014-02-02 NOTE — Progress Notes (Signed)
Pre visit review using our clinic review tool, if applicable. No additional management support is needed unless otherwise documented below in the visit note. 

## 2014-02-02 NOTE — Assessment & Plan Note (Signed)
New to provider, ongoing for pt.  Start SSRI to improve mood and anger.  Encouraged counseling.  Will follow.

## 2014-02-03 ENCOUNTER — Telehealth: Payer: Self-pay | Admitting: Family Medicine

## 2014-02-03 ENCOUNTER — Encounter: Payer: Self-pay | Admitting: General Practice

## 2014-02-03 NOTE — Telephone Encounter (Signed)
Unable to reach pre visit.  

## 2014-02-03 NOTE — Telephone Encounter (Signed)
Relevant patient education assigned to patient using Emmi. ° °

## 2014-02-10 ENCOUNTER — Emergency Department (HOSPITAL_COMMUNITY): Payer: BC Managed Care – PPO

## 2014-02-10 ENCOUNTER — Encounter (HOSPITAL_COMMUNITY): Payer: Self-pay | Admitting: Emergency Medicine

## 2014-02-10 ENCOUNTER — Telehealth: Payer: Self-pay | Admitting: Family Medicine

## 2014-02-10 ENCOUNTER — Emergency Department (HOSPITAL_COMMUNITY)
Admission: EM | Admit: 2014-02-10 | Discharge: 2014-02-10 | Disposition: A | Discharge status: Home / Self Care | Payer: BC Managed Care – PPO | Attending: Emergency Medicine | Admitting: Emergency Medicine

## 2014-02-10 DIAGNOSIS — R002 Palpitations: Secondary | ICD-10-CM | POA: Insufficient documentation

## 2014-02-10 DIAGNOSIS — I10 Essential (primary) hypertension: Secondary | ICD-10-CM | POA: Insufficient documentation

## 2014-02-10 DIAGNOSIS — F411 Generalized anxiety disorder: Secondary | ICD-10-CM | POA: Insufficient documentation

## 2014-02-10 DIAGNOSIS — Z79899 Other long term (current) drug therapy: Secondary | ICD-10-CM | POA: Insufficient documentation

## 2014-02-10 DIAGNOSIS — J45909 Unspecified asthma, uncomplicated: Secondary | ICD-10-CM | POA: Insufficient documentation

## 2014-02-10 LAB — BASIC METABOLIC PANEL
BUN: 7 mg/dL (ref 6–23)
CO2: 23 mEq/L (ref 19–32)
Calcium: 9.3 mg/dL (ref 8.4–10.5)
Chloride: 102 mEq/L (ref 96–112)
Creatinine, Ser: 0.74 mg/dL (ref 0.50–1.35)
Glucose, Bld: 93 mg/dL (ref 70–99)
Potassium: 4 mEq/L (ref 3.7–5.3)
Sodium: 140 mEq/L (ref 137–147)

## 2014-02-10 LAB — I-STAT TROPONIN, ED: Troponin i, poc: 0 ng/mL (ref 0.00–0.08)

## 2014-02-10 LAB — CBC
HCT: 43.2 % (ref 39.0–52.0)
Hemoglobin: 14.9 g/dL (ref 13.0–17.0)
MCH: 29.6 pg (ref 26.0–34.0)
MCHC: 34.5 g/dL (ref 30.0–36.0)
MCV: 85.9 fL (ref 78.0–100.0)
Platelets: 307 10*3/uL (ref 150–400)
RBC: 5.03 MIL/uL (ref 4.22–5.81)
RDW: 13.2 % (ref 11.5–15.5)
WBC: 9.7 10*3/uL (ref 4.0–10.5)

## 2014-02-10 LAB — D-DIMER, QUANTITATIVE (NOT AT ARMC)

## 2014-02-10 NOTE — ED Provider Notes (Signed)
CSN: [id number]     Arrival date & time 02/10/14  1023 History   First MD Initiated Contact with Patient 02/10/14 1051     Chief Complaint  Patient presents with  . Tachycardia     (Consider location/radiation/quality/duration/timing/severity/associated sxs/prior Treatment) HPI  23 year old morbidly obese male with history of hypertension, asthma, general anxiety who presents for evaluation of shortness of breath. Patient reports while trying to go to sleep last night he developed an acute onset of heart palpitation, profound sweats, having nausea, and subsequently having shortness of breath. States the shortness of breath has been ongoing since. Also reports having pain to both of his legs it started out around the same time last night. Pain is described as a sharp and aching sensation lasting for about 30 minutes and has resolved. No complaints of fever, chills, headache, productive cough, hemoptysis, chest pain, abdominal pain, back pain, numbness or weakness. Denies any recent strenuous activities. No prior history of PE or DVT, no recent surgery, prolonged bed rest, unilateral neck swelling, history of cancer, or hemoptysis. Patient is a nonsmoker. Does have significant family history of cardiac disease. No prior history of exertional syncope. No recent medication changes  Past Medical History  Diagnosis Date  . Asthma   . Morbid obesity   . Hypertension    Past Surgical History  Procedure Laterality Date  . Tonsillectomy and adenoidectomy     Family History  Problem Relation Age of Onset  . Arthritis Mother   . Diabetes Mother   . Alcohol abuse Father   . Arthritis Father   . Hyperlipidemia Father   . Stroke Father   . Hypertension Father   . Mental illness Father   . Diabetes Father   . Arthritis Maternal Grandmother   . Stroke Maternal Grandmother   . Diabetes Maternal Grandmother   . Arthritis Maternal Grandfather   . Heart attack Maternal Grandfather 50  . Diabetes  Maternal Grandfather    History  Substance Use Topics  . Smoking status: Never Smoker   . Smokeless tobacco: Not on file  . Alcohol Use: No    Review of Systems  All other systems reviewed and are negative.     Allergies  Ceftin  Home Medications   Prior to Admission medications   Medication Sig Start Date End Date Taking? Authorizing Provider  FLUoxetine (PROZAC) 20 MG tablet Take 1 tablet (20 mg total) by mouth daily. 02/02/14  Yes Sheliah HatchKatherine E Tabori, MD  ibuprofen (ADVIL,MOTRIN) 400 MG tablet Take 400 mg by mouth every 6 (six) hours as needed for moderate pain.    Yes Historical Provider, MD  losartan (COZAAR) 50 MG tablet Take 1 tablet (50 mg total) by mouth daily. 02/02/14  Yes Sheliah HatchKatherine E Tabori, MD  pantoprazole (PROTONIX) 40 MG tablet Take 1 tablet (40 mg total) by mouth daily. 02/02/14  Yes Sheliah HatchKatherine E Tabori, MD  pseudoephedrine (SUDAFED) 60 MG tablet Take 1 tablet (60 mg total) by mouth every 6 (six) hours as needed for congestion. 01/26/14  Yes Eleanore E Egan, PA-C   BP 130/73  Pulse 90  Temp(Src) 98.7 F (37.1 C) (Oral)  Resp 18  SpO2 95% Physical Exam  Nursing note and vitals reviewed. Constitutional: He appears well-developed and well-nourished. No distress.  Patient is morbidly obese, in no acute distress  HENT:  Head: Atraumatic.  Eyes: Conjunctivae are normal.  Neck: Normal range of motion. Neck supple.  Cardiovascular: Normal rate and regular rhythm.  Exam reveals no gallop and  no friction rub.   No murmur heard. Pulmonary/Chest: Effort normal and breath sounds normal.  Abdominal: Soft. There is no tenderness.  Musculoskeletal: He exhibits no edema.  Bilateral lower extremities without palpable cords, erythema, edema, negative Homans sign  Neurological: He is alert.  Skin: No rash noted.  Psychiatric: He has a normal mood and affect.    ED Course  Procedures (including critical care time)  11:13 AM Patient report having tachycardia, shortness of  breath, and diaphoresis since last night. No evidence of tachycardia at this time. No significant risk factors for DVT or PE, d-dimer ordered. Will obtain chest x-ray, EKG, and basic labs. Doubt ACS.  12:29 PM EKG with nonspecific T wave abnormalities but no acute ischemic changes. Given that patient has had symptoms for more than 12 hours, with normal troponin I do not suspect ACS. Normal H&H. Basic metabolic panel all reassuring. D-dimer is negative, chest x-ray is unremarkable. Ambulate while maintaining 98-99% O2 on RA.  No tachycardia.  Pt agrees to f/u with PCP.  Return precaution discussed.  Labs Review Labs Reviewed  CBC  BASIC METABOLIC PANEL  D-DIMER, QUANTITATIVE  Rosezena SensorI-STAT TROPOININ, ED    Imaging Review Dg Chest 2 View  02/10/2014   CLINICAL DATA:  Increased heart rate  EXAM: CHEST  2 VIEW  COMPARISON:  01/19/2014  FINDINGS: Cardiomediastinal silhouette is unremarkable. No acute infiltrate or pleural effusion. No pulmonary edema. Bony thorax is unremarkable.  IMPRESSION: No active cardiopulmonary disease.   Electronically Signed   By: Natasha MeadLiviu  Pop M.D.   On: 02/10/2014 11:53     EKG Interpretation   Date/Time:  Wednesday February 10 2014 10:29:25 EDT Ventricular Rate:  90 PR Interval:  144 QRS Duration: 92 QT Interval:  326 QTC Calculation: 398 R Axis:   71 Text Interpretation:  Normal sinus rhythm Baseline wander Nonspecific T  wave abnormality Confirmed by [facility]  MD, KATHLEEN ([id number]) on 02/10/2014  10:53:01 AM      MDM   Final diagnoses:  Heart palpitations    BP 95/46  Pulse 81  Temp(Src) 98.7 F (37.1 C) (Oral)  Resp 15  SpO2 100%  I have reviewed nursing notes and vital signs. I personally reviewed the imaging tests through PACS system  I reviewed available ER/hospitalization records thought the EMR     Fayrene HelperBowie Kazuko Clemence, [place] JerseyPA-C 02/10/14 1319

## 2014-02-10 NOTE — Telephone Encounter (Signed)
Patient Information:  Caller Name: Nicholes MangoFurman  Phone: [phone]  Patient: Omar Krause, Omar Krause  Gender: Male  DOB: [birth year]  Age: 23 Years  PCP: Sheliah Hatchabori, Katherine E.  Office Follow Up:  Does the office need to follow up with this patient?: No  Instructions For The Office: N/A  RN Note:  Intermittent, moderate (6/10), right sided chest pain with episodes of tachycardia.  Last angina was at 0700, tachycardia at 0500.  Mild shortness of breath present with some diaphoresis.  No nausea. Pain does not radiate anywhere. Hx of morbid obesity and HTN.  Advised to call 911 now for visible sweat on face with intermittent chest pain and tachycardia per Chest Pain.  Agreed to call 911 now.  Symptoms  Reason For Call & Symptoms: Concerned he iis having a reaction to either Losartan or Prozac.  Both are new meds started 02/02/14. Reports heart "started racing" and felt pains "that were not really there" in legs and chest. Continues to have some intermittent tachycardia and  chest discomfort on right side of chest.  Reviewed Health History In EMR: Yes  Reviewed Medications In EMR: Yes  Reviewed Allergies In EMR: Yes  Reviewed Surgeries / Procedures: Yes  Date of Onset of Symptoms: 02/09/2014  Guideline(s) Used:  Chest Pain  Disposition Per Guideline:   Call EMS 911 Now  Reason For Disposition Reached:   Visible sweat on face or sweat dripping down face  Advice Given:  N/A  Patient Will Follow Care Advice:  YES

## 2014-02-10 NOTE — Telephone Encounter (Signed)
I called the home number to ensure patient went to the ED and the line was busy. I tried the cell and the VM picked up. No message left at this time, I will try again with the next 5-10 mins.     KP

## 2014-02-10 NOTE — ED Notes (Signed)
Pt reports last night around 3am he started to feel his heart racing, reports it was intermittent. Lasted approx 5 mins. Then returned every couple of hours. Reports he became sweating, sob, nauseous and had right sided cp with this episodes. Pt sts he just started Prozac, protonix and lorstatan on Tuesday, denies issues with these medications. Called PCP and was told it could be a possible reaction to the meds and to go to ED for further evaluation. Nad, skin warm and dry, resp e/u.

## 2014-02-10 NOTE — ED Notes (Signed)
Pt returned from radiology.

## 2014-02-10 NOTE — ED Notes (Signed)
Pt denies feeling CP at this moment but sts he is having some SOB.

## 2014-02-10 NOTE — ED Notes (Signed)
He states his heart has been racing and hes been sweaty since last night.

## 2014-02-10 NOTE — Discharge Instructions (Signed)
Palpitations   A palpitation is the feeling that your heartbeat is irregular or is faster than normal. It may feel like your heart is fluttering or skipping a beat. Palpitations are usually not a serious problem. However, in some cases, you may need further medical evaluation.  CAUSES   Palpitations can be caused by:   Smoking.   Caffeine or other stimulants, such as diet pills or energy drinks.   Alcohol.   Stress and anxiety.   Strenuous physical activity.   Fatigue.   Certain medicines.   Heart disease, especially if you have a history of arrhythmias. This includes atrial fibrillation, atrial flutter, or supraventricular tachycardia.   An improperly working pacemaker or defibrillator.  DIAGNOSIS   To find the cause of your palpitations, your caregiver will take your history and perform a physical exam. Tests may also be done, including:   Electrocardiography (ECG). This test records the heart's electrical activity.   Cardiac monitoring. This allows your caregiver to monitor your heart rate and rhythm in real time.   Holter monitor. This is a portable device that records your heartbeat and can help diagnose heart arrhythmias. It allows your caregiver to track your heart activity for several days, if needed.   Stress tests by exercise or by giving medicine that makes the heart beat faster.  TREATMENT   Treatment of palpitations depends on the cause of your symptoms and can vary greatly. Most cases of palpitations do not require any treatment other than time, relaxation, and monitoring your symptoms. Other causes, such as atrial fibrillation, atrial flutter, or supraventricular tachycardia, usually require further treatment.  HOME CARE INSTRUCTIONS    Avoid:   Caffeinated coffee, tea, soft drinks, diet pills, and energy drinks.   Chocolate.   Alcohol.   Stop smoking if you smoke.   Reduce your stress and anxiety. Things that can help you relax include:   A method that measures bodily functions so  you can learn to control them (biofeedback).   Yoga.   Meditation.   Physical activity such as swimming, jogging, or walking.   Get plenty of rest and sleep.  SEEK MEDICAL CARE IF:    You continue to have a fast or irregular heartbeat beyond 24 hours.   Your palpitations occur more often.  SEEK IMMEDIATE MEDICAL CARE IF:   You develop chest pain or shortness of breath.   You have a severe headache.   You feel dizzy, or you faint.  MAKE SURE YOU:   Understand these instructions.   Will watch your condition.   Will get help right away if you are not doing well or get worse.  Document Released: 10/05/2000 Document Revised: 02/02/2013 Document Reviewed: 12/07/2011  ExitCare Patient Information 2014 [employer].

## 2014-02-10 NOTE — Telephone Encounter (Signed)
Review patient chart and he is in the ED.     KP

## 2014-02-12 NOTE — ED Provider Notes (Signed)
Medical screening examination/treatment/procedure(s) were performed by non-physician practitioner and as supervising physician I was immediately available for consultation/collaboration.   EKG Interpretation   Date/Time:  Wednesday February 10 2014 10:29:25 EDT Ventricular Rate:  90 PR Interval:  144 QRS Duration: 92 QT Interval:  326 QTC Calculation: 398 R Axis:   71 Text Interpretation:  Normal sinus rhythm Baseline wander Nonspecific T  wave abnormality Confirmed by [facility]  MD, Django Nguyen ([id number]) on 02/10/2014  10:53:01 AM        Omar AngerKathleen M Eleanora Guinyard, DO 02/12/14 [id number]

## 2014-02-15 ENCOUNTER — Ambulatory Visit (INDEPENDENT_AMBULATORY_CARE_PROVIDER_SITE_OTHER): Payer: BC Managed Care – PPO | Admitting: Family Medicine

## 2014-02-15 ENCOUNTER — Encounter: Payer: Self-pay | Admitting: Family Medicine

## 2014-02-15 ENCOUNTER — Encounter: Payer: Self-pay | Admitting: General Practice

## 2014-02-15 VITALS — BP 150/68 | HR 59 | Temp 98.7°F | Resp 18 | Wt 369.8 lb

## 2014-02-15 DIAGNOSIS — F411 Generalized anxiety disorder: Secondary | ICD-10-CM

## 2014-02-15 DIAGNOSIS — I10 Essential (primary) hypertension: Secondary | ICD-10-CM

## 2014-02-15 MED ORDER — LOSARTAN POTASSIUM 100 MG PO TABS: 100.0000 mg | ORAL_TABLET | Freq: Every day | ORAL | Status: DC

## 2014-02-15 MED ORDER — FLUOXETINE HCL 40 MG PO CAPS: 40.0000 mg | ORAL_CAPSULE | Freq: Every day | ORAL | Status: DC

## 2014-02-15 NOTE — Progress Notes (Signed)
   Subjective:    Patient ID: Omar Krause, male    DOB: [birth year], 23 y.o.   MRN: [id number]  HPI HTN- BP is slightly better than last visit.  No CP, SOB, HAs, visual changes, edema.  Generalized Anxiety- mildly improved since starting the Prozac.  Able to sleep 'a little bit better'.  Has not improved the anger issue.  ER f/u- on 4/22 had palpitations, pain, sweating.  Had normal CXR, nonspecific EKG, normal labs.  'they didn't really tell me what they thought it was, they just told me i was ok to go home'.   Review of Systems For ROS see HPI     Objective:   Physical Exam  Vitals reviewed. Constitutional: He is oriented to person, place, and time. He appears well-developed and well-nourished. No distress.  Morbidly obese  HENT:  Head: Normocephalic and atraumatic.  Eyes: Conjunctivae and EOM are normal. Pupils are equal, round, and reactive to light.  Neck: Normal range of motion. Neck supple. No thyromegaly present.  Cardiovascular: Normal rate, regular rhythm, normal heart sounds and intact distal pulses.   No murmur heard. Pulmonary/Chest: Effort normal and breath sounds normal. No respiratory distress.  Abdominal: Soft. Bowel sounds are normal. He exhibits no distension.  Musculoskeletal: He exhibits no edema.  Lymphadenopathy:    He has no cervical adenopathy.  Neurological: He is alert and oriented to person, place, and time. No cranial nerve deficit.  Skin: Skin is warm and dry.  Psychiatric: He has a normal mood and affect. His behavior is normal.          Assessment & Plan:

## 2014-02-15 NOTE — Progress Notes (Signed)
Pre visit review using our clinic review tool, if applicable. No additional management support is needed unless otherwise documented below in the visit note. 

## 2014-02-15 NOTE — Assessment & Plan Note (Signed)
Chronic problem.  Only mildly improved since starting Prozac.  Increase dose and continue to monitor.

## 2014-02-15 NOTE — Patient Instructions (Signed)
Follow up in 3-4 weeks to recheck BP and mood Increase the Losartan to 100mg  daily (2 of what you currently have and 1 of the new script) Increase the Prozac to 40mg  daily (2 of what you currently have and 1 of the new script) Call with any questions or concerns Hang in there!!

## 2014-02-15 NOTE — Assessment & Plan Note (Signed)
Noted at last visit.  Only mildly improved since starting Losartan.  Currently asymptomatic.  Will increase Losartan to 100mg  daily and follow closely.

## 2014-02-16 ENCOUNTER — Telehealth: Payer: Self-pay | Admitting: Family Medicine

## 2014-02-16 NOTE — Telephone Encounter (Signed)
Relevant patient education assigned to patient using Emmi. ° °

## 2014-03-02 ENCOUNTER — Ambulatory Visit: Payer: BC Managed Care – PPO | Admitting: Family Medicine

## 2014-03-19 ENCOUNTER — Ambulatory Visit (INDEPENDENT_AMBULATORY_CARE_PROVIDER_SITE_OTHER): Payer: BC Managed Care – PPO | Admitting: Family Medicine

## 2014-03-19 ENCOUNTER — Encounter: Payer: Self-pay | Admitting: Family Medicine

## 2014-03-19 VITALS — BP 130/90 | HR 71 | Temp 98.2°F | Resp 16 | Wt 371.0 lb

## 2014-03-19 DIAGNOSIS — F411 Generalized anxiety disorder: Secondary | ICD-10-CM

## 2014-03-19 DIAGNOSIS — I10 Essential (primary) hypertension: Secondary | ICD-10-CM

## 2014-03-19 LAB — BASIC METABOLIC PANEL
BUN: 7 mg/dL (ref 6–23)
CALCIUM: 8.9 mg/dL (ref 8.4–10.5)
CO2: 26 meq/L (ref 19–32)
CREATININE: 0.7 mg/dL (ref 0.4–1.5)
Chloride: 104 mEq/L (ref 96–112)
GFR: 145.23 mL/min (ref >60.00)
Glucose, Bld: 82 mg/dL (ref 70–99)
Potassium: 4 mEq/L (ref 3.5–5.1)
Sodium: 136 mEq/L (ref 135–145)

## 2014-03-19 MED ORDER — FLUOXETINE HCL 40 MG PO CAPS: ORAL_CAPSULE | ORAL | Status: DC

## 2014-03-19 NOTE — Progress Notes (Signed)
Pre visit review using our clinic review tool, if applicable. No additional management support is needed unless otherwise documented below in the visit note. 

## 2014-03-19 NOTE — Assessment & Plan Note (Signed)
Much improved since starting Losartan daily.  Asymptomatic.  Check BMP.  No anticipated med changes.  Will follow.

## 2014-03-19 NOTE — Progress Notes (Signed)
   Subjective:    Patient ID: Omar Krause, male    DOB: 1991/07/01, 23 y.o.   MRN: [id number]  HPI HTN- chronic problem, much improved since starting Losartan.  No CP, SOB, HAs, visual changes, edema.  Depression/anxiety- started Prozac at last visit.  Pt reports mood has improved, less anxious.  Still having difficulty w/ sleep.  Interested in increasing dose   Review of Systems For ROS see HPI     Objective:   Physical Exam  Vitals reviewed. Constitutional: He is oriented to person, place, and time. He appears well-developed and well-nourished. No distress.  obese  HENT:  Head: Normocephalic and atraumatic.  Eyes: Conjunctivae and EOM are normal. Pupils are equal, round, and reactive to light.  Neck: Normal range of motion. Neck supple. No thyromegaly present.  Cardiovascular: Normal rate, regular rhythm, normal heart sounds and intact distal pulses.   No murmur heard. Pulmonary/Chest: Effort normal and breath sounds normal. No respiratory distress.  Abdominal: Soft. Bowel sounds are normal. He exhibits no distension.  Musculoskeletal: He exhibits no edema.  Lymphadenopathy:    He has no cervical adenopathy.  Neurological: He is alert and oriented to person, place, and time. No cranial nerve deficit.  Skin: Skin is warm and dry.  Psychiatric: He has a normal mood and affect. His behavior is normal.          Assessment & Plan:

## 2014-03-19 NOTE — Patient Instructions (Signed)
Schedule your complete physical in 3-4 months We'll notify you of your lab results and make any changes if needed Increase the Prozac to 80 mg (2 tabs daily) Call with any questions or concerns Have a great weekend!

## 2014-03-19 NOTE — Assessment & Plan Note (Signed)
Improved since starting Prozac.  Pt would like to increase dose and see if sxs improve even more.  Increase to 80mg  daily.  Will continue to monitor.

## 2014-04-04 ENCOUNTER — Emergency Department (HOSPITAL_COMMUNITY): Payer: BC Managed Care – PPO

## 2014-04-04 ENCOUNTER — Encounter (HOSPITAL_COMMUNITY): Payer: Self-pay | Admitting: Emergency Medicine

## 2014-04-04 ENCOUNTER — Emergency Department (HOSPITAL_COMMUNITY)
Admission: EM | Admit: 2014-04-04 | Discharge: 2014-04-05 | Disposition: A | Discharge status: Home / Self Care | Payer: BC Managed Care – PPO | Attending: Emergency Medicine | Admitting: Emergency Medicine

## 2014-04-04 DIAGNOSIS — Y9389 Activity, other specified: Secondary | ICD-10-CM | POA: Insufficient documentation

## 2014-04-04 DIAGNOSIS — S9030XA Contusion of unspecified foot, initial encounter: Secondary | ICD-10-CM | POA: Insufficient documentation

## 2014-04-04 DIAGNOSIS — S9032XA Contusion of left foot, initial encounter: Secondary | ICD-10-CM

## 2014-04-04 DIAGNOSIS — W208XXA Other cause of strike by thrown, projected or falling object, initial encounter: Secondary | ICD-10-CM | POA: Insufficient documentation

## 2014-04-04 DIAGNOSIS — F3289 Other specified depressive episodes: Secondary | ICD-10-CM | POA: Insufficient documentation

## 2014-04-04 DIAGNOSIS — F329 Major depressive disorder, single episode, unspecified: Secondary | ICD-10-CM | POA: Insufficient documentation

## 2014-04-04 DIAGNOSIS — Z79899 Other long term (current) drug therapy: Secondary | ICD-10-CM | POA: Insufficient documentation

## 2014-04-04 DIAGNOSIS — Y929 Unspecified place or not applicable: Secondary | ICD-10-CM | POA: Insufficient documentation

## 2014-04-04 DIAGNOSIS — J45909 Unspecified asthma, uncomplicated: Secondary | ICD-10-CM | POA: Insufficient documentation

## 2014-04-04 DIAGNOSIS — I10 Essential (primary) hypertension: Secondary | ICD-10-CM | POA: Insufficient documentation

## 2014-04-04 MED ORDER — IBUPROFEN 800 MG PO TABS
800.0000 mg | ORAL_TABLET | Freq: Once | ORAL | Status: AC
  Administered 2014-04-04: 800 mg via ORAL
  Filled 2014-04-04: qty 1

## 2014-04-04 NOTE — ED Notes (Signed)
Dropped a chair on my left foot, it has a knot on it per pt.

## 2014-04-04 NOTE — ED Notes (Signed)
Patient transported to X-ray 

## 2014-04-04 NOTE — ED Provider Notes (Signed)
CSN: [id number]     Arrival date & time 04/04/14  2202 History   First MD Initiated Contact with Patient 04/04/14 2226     Chief Complaint  Patient presents with  . Foot Injury     (Consider location/radiation/quality/duration/timing/severity/associated sxs/prior Treatment) Patient is a 23 y.o. male presenting with foot injury. The history is provided by the patient.  Foot Injury Location:  Foot Time since incident:  1 hour Injury: yes   Mechanism of injury comment:  Sustained a glancing blow from the leg of a wooden stool Foot location:  L foot Pain details:    Quality:  Aching and throbbing   Radiates to:  Does not radiate   Severity:  Severe   Onset quality:  Sudden   Timing:  Constant   Progression:  Unchanged Chronicity:  New Dislocation: no   Foreign body present: no skin injury. Relieved by:  None tried Exacerbated by: palpation. Ineffective treatments:  None tried Associated symptoms: swelling   Associated symptoms: no fever, no numbness and no tingling     Past Medical History  Diagnosis Date  . Asthma   . Morbid obesity   . Hypertension   . Depression    Past Surgical History  Procedure Laterality Date  . Tonsillectomy and adenoidectomy     Family History  Problem Relation Age of Onset  . Arthritis Mother   . Diabetes Mother   . Alcohol abuse Father   . Arthritis Father   . Hyperlipidemia Father   . Stroke Father   . Hypertension Father   . Mental illness Father   . Diabetes Father   . Arthritis Maternal Grandmother   . Stroke Maternal Grandmother   . Diabetes Maternal Grandmother   . Arthritis Maternal Grandfather   . Heart attack Maternal Grandfather 50  . Diabetes Maternal Grandfather    History  Substance Use Topics  . Smoking status: Never Smoker   . Smokeless tobacco: Not on file  . Alcohol Use: No    Review of Systems  Constitutional: Negative for fever.  Musculoskeletal: Positive for arthralgias. Negative for joint swelling and  myalgias.  Neurological: Negative for weakness and numbness.      Allergies  Ceftin  Home Medications   Prior to Admission medications   Medication Sig Start Date End Date Taking? Authorizing Provider  FLUoxetine (PROZAC) 40 MG capsule Take 2 tabs daily (80 mg total) 03/19/14   Sheliah HatchKatherine E Tabori, MD  ibuprofen (ADVIL,MOTRIN) 400 MG tablet Take 400 mg by mouth every 6 (six) hours as needed for moderate pain.     Historical Provider, MD  ibuprofen (ADVIL,MOTRIN) 600 MG tablet Take 1 tablet (600 mg total) by mouth every 6 (six) hours as needed. 04/05/14   Burgess AmorJulie Loretta Doutt, PA-C  losartan (COZAAR) 100 MG tablet Take 1 tablet (100 mg total) by mouth daily. 02/15/14   Sheliah HatchKatherine E Tabori, MD  pantoprazole (PROTONIX) 40 MG tablet Take 1 tablet (40 mg total) by mouth daily. 02/02/14   Sheliah HatchKatherine E Tabori, MD   BP 156/63  Pulse 83  Temp(Src) 98.2 F (36.8 C) (Oral)  Resp 16  Ht 6' (1.829 m)  Wt 330 lb (149.687 kg)  BMI 44.75 kg/m2  SpO2 99% Physical Exam  Constitutional: He appears well-developed and well-nourished.  HENT:  Head: Atraumatic.  Neck: Normal range of motion.  Cardiovascular:  Pulses:      Dorsalis pedis pulses are 2+ on the right side, and 2+ on the left side.  Pulses equal bilaterally  Musculoskeletal: He exhibits tenderness.  ttp left mid anterior foot with moderate localized edema.  Less than 3 sec cap refill in toes.  Neurological: He is alert. He has normal strength. He displays normal reflexes. No sensory deficit.  Skin: Skin is warm and dry. No erythema.  Psychiatric: He has a normal mood and affect.    ED Course  Procedures (including critical care time) Labs Review Labs Reviewed - No data to display  Imaging Review Dg Foot Complete Left  04/04/2014   CLINICAL DATA:  Pain and swelling across the dorsum of the foot.  EXAM: LEFT FOOT - COMPLETE 3+ VIEW  COMPARISON:  None.  FINDINGS: Anatomic alignment of the bones of the left foot. There is no fracture. Mild soft  tissue swelling is present dorsal of the midfoot.  IMPRESSION: No acute osseous abnormality.   Electronically Signed   By: Andreas NewportGeoffrey  Lamke M.D.   On: 04/04/2014 23:54     EKG Interpretation None      MDM   Final diagnoses:  Contusion of left foot    Ace wrap, rest,  Ice,  Elevation.  Ibuprofen.  F/u with pcp if sx not resolved over 10 days.  Patients labs and/or radiological studies were viewed and considered during the medical decision making and disposition process.     Burgess AmorJulie Chelse Matas, PA-C 04/05/14 [phone]

## 2014-04-05 MED ORDER — IBUPROFEN 600 MG PO TABS: 600.0000 mg | ORAL_TABLET | Freq: Four times a day (QID) | ORAL | Status: DC | PRN

## 2014-04-05 NOTE — Discharge Instructions (Signed)
Foot Contusion °A foot contusion is a deep bruise to the foot. Contusions are the result of an injury that caused bleeding under the skin. The contusion may turn blue, purple, or yellow. Minor injuries will give you a painless contusion, but more severe contusions may stay painful and swollen for a few weeks. °CAUSES  °A foot contusion comes from a direct blow to that area, such as a heavy object falling on the foot. °SYMPTOMS  °· Swelling of the foot. °· Discoloration of the foot. °· Tenderness or soreness of the foot. °DIAGNOSIS  °You will have a physical exam and will be asked about your history. You may need an X-ray of your foot to look for a broken bone (fracture).  °TREATMENT  °An elastic wrap may be recommended to support your foot. Resting, elevating, and applying cold compresses to your foot are often the best treatments for a foot contusion. Over-the-counter medicines may also be recommended for pain control. °HOME CARE INSTRUCTIONS  °· Put ice on the injured area. °· Put ice in a plastic bag. °· Place a towel between your skin and the bag. °· Leave the ice on for 15-20 minutes, 03-04 times a day. °· Only take over-the-counter or prescription medicines for pain, discomfort, or fever as directed by your caregiver. °· If told, use an elastic wrap as directed. This can help reduce swelling. You may remove the wrap for sleeping, showering, and bathing. If your toes become numb, cold, or blue, take the wrap off and reapply it more loosely. °· Elevate your foot with pillows to reduce swelling. °· Try to avoid standing or walking while the foot is painful. Do not resume use until instructed by your caregiver. Then, begin use gradually. If pain develops, decrease use. Gradually increase activities that do not cause discomfort until you have normal use of your foot. °· See your caregiver as directed. It is very important to keep all follow-up appointments in order to avoid any lasting problems with your foot,  including long-term (chronic) pain. °SEEK IMMEDIATE MEDICAL CARE IF:  °· You have increased redness, swelling, or pain in your foot. °· Your swelling or pain is not relieved with medicines. °· You have loss of feeling in your foot or are unable to move your toes. °· Your foot turns cold or blue. °· You have pain when you move your toes. °· Your foot becomes warm to the touch. °· Your contusion does not improve in 2 days. °MAKE SURE YOU:  °· Understand these instructions. °· Will watch your condition. °· Will get help right away if you are not doing well or get worse. °Document Released: 07/30/2006 Document Revised: 04/08/2012 Document Reviewed: 09/11/2011 °ExitCare® Patient Information ©2014 [employer]. ° °

## 2014-04-07 NOTE — ED Provider Notes (Signed)
Medical screening examination/treatment/procedure(s) were performed by non-physician practitioner and as supervising physician I was immediately available for consultation/collaboration.     Kathelyn Gombos, MD 04/07/14 1502 

## 2014-04-30 ENCOUNTER — Ambulatory Visit (INDEPENDENT_AMBULATORY_CARE_PROVIDER_SITE_OTHER): Payer: BC Managed Care – PPO

## 2014-04-30 ENCOUNTER — Ambulatory Visit (INDEPENDENT_AMBULATORY_CARE_PROVIDER_SITE_OTHER): Payer: BC Managed Care – PPO | Admitting: Family Medicine

## 2014-04-30 VITALS — BP 140/80 | HR 76 | Temp 98.3°F | Resp 16 | Ht 71.0 in | Wt 360.4 lb

## 2014-04-30 DIAGNOSIS — H9209 Otalgia, unspecified ear: Secondary | ICD-10-CM

## 2014-04-30 DIAGNOSIS — R112 Nausea with vomiting, unspecified: Secondary | ICD-10-CM

## 2014-04-30 DIAGNOSIS — R1310 Dysphagia, unspecified: Secondary | ICD-10-CM

## 2014-04-30 DIAGNOSIS — H9201 Otalgia, right ear: Secondary | ICD-10-CM

## 2014-04-30 LAB — POCT RAPID STREP A (OFFICE): RAPID STREP A SCREEN: NEGATIVE

## 2014-04-30 MED ORDER — IBUPROFEN 600 MG PO TABS: 600.0000 mg | ORAL_TABLET | Freq: Four times a day (QID) | ORAL | Status: DC | PRN

## 2014-04-30 MED ORDER — MAGIC MOUTHWASH W/LIDOCAINE: 5.0000 mL | Freq: Four times a day (QID) | ORAL | Status: DC | PRN

## 2014-04-30 NOTE — Progress Notes (Signed)
Subjective:    Patient ID: Rose PhiFurman Disch III, male    DOB: 1991/04/03, 23 y.o.   MRN: [id number]  04/30/2014  Otalgia   Otalgia  Associated symptoms include headaches, a sore throat and vomiting. Pertinent negatives include no abdominal pain, coughing, diarrhea, ear discharge, hearing loss, rash or rhinorrhea.   This 23 y.o. male presents for evaluation of sore throat with pain radiating into R ear with swallowing. Vomited last night x 3 after getting upset after conversation with father.  Awoke this morning with pain with swallowing with pain radiating into R ear.  No fever/chills/sweats.  Mild headache.  No constant ear pain; no decreased hearing; no drainage from ears. Minimal congestion.  No cough. No SOB.  No recurrent vomiting today.  No diarrhea. No abdominal pain.  Has been drinking fluids today without difficulties; has not eaten solid foods.  No medication for pain.   Review of Systems  Constitutional: Negative for fever, chills, diaphoresis and fatigue.  HENT: Positive for ear pain, sore throat and trouble swallowing. Negative for congestion, drooling, ear discharge, facial swelling, hearing loss, postnasal drip, rhinorrhea, sinus pressure, sneezing and voice change.   Respiratory: Negative for cough, shortness of breath, wheezing and stridor.   Gastrointestinal: Positive for vomiting. Negative for nausea, abdominal pain and diarrhea.  Skin: Negative for rash.  Neurological: Positive for headaches.  Hematological: Negative for adenopathy.    Past Medical History  Diagnosis Date  . Asthma   . Morbid obesity   . Hypertension   . Depression    Past Surgical History  Procedure Laterality Date  . Tonsillectomy and adenoidectomy      Allergies  Allergen Reactions  . Ceftin [Cefuroxime Axetil] Itching and Other (See Comments)   Current Outpatient Prescriptions  Medication Sig Dispense Refill  . FLUoxetine (PROZAC) 40 MG capsule Take 2 tabs daily (80 mg total)  60 capsule   3  . ibuprofen (ADVIL,MOTRIN) 600 MG tablet Take 1 tablet (600 mg total) by mouth every 6 (six) hours as needed.  30 tablet  0  . losartan (COZAAR) 100 MG tablet Take 1 tablet (100 mg total) by mouth daily.  30 tablet  3  . pantoprazole (PROTONIX) 40 MG tablet Take 1 tablet (40 mg total) by mouth daily.  30 tablet  3  . Alum & Mag Hydroxide-Simeth (MAGIC MOUTHWASH W/LIDOCAINE) SOLN Take 5 mLs by mouth 4 (four) times daily as needed for mouth pain.  120 mL  0   No current facility-administered medications for this visit.       Objective:    BP 140/80  Pulse 76  Temp(Src) 98.3 F (36.8 C) (Oral)  Resp 16  Ht 5\' 11"  (1.803 m)  Wt 360 lb 6.4 oz (163.476 kg)  BMI 50.29 kg/m2  SpO2 99% Physical Exam  Nursing note and vitals reviewed. Constitutional: He is oriented to person, place, and time. He appears well-developed and well-nourished. No distress.  HENT:  Head: Normocephalic and atraumatic.  Right Ear: External ear normal.  Left Ear: External ear normal.  Nose: Nose normal.  Mouth/Throat: Oropharynx is clear and moist.  Eyes: Conjunctivae and EOM are normal. Pupils are equal, round, and reactive to light.  Neck: Normal range of motion. Neck supple. Carotid bruit is not present. No thyromegaly present.  Cardiovascular: Normal rate, regular rhythm, normal heart sounds and intact distal pulses.  Exam reveals no gallop and no friction rub.   No murmur heard. Pulmonary/Chest: Effort normal and breath sounds normal. He has  no wheezes. He has no rales.  Abdominal: Soft. Bowel sounds are normal. He exhibits no distension and no mass. There is no tenderness. There is no rebound and no guarding.  Lymphadenopathy:    He has no cervical adenopathy.  Neurological: He is alert and oriented to person, place, and time. No cranial nerve deficit.  Skin: Skin is warm and dry. No rash noted. He is not diaphoretic.  Psychiatric: He has a normal mood and affect. His behavior is normal.   Results for  orders placed in visit on 04/30/14  POCT RAPID STREP A (OFFICE)      Result Value Ref Range   Rapid Strep A Screen Negative  Negative   [facility] reading (PRIMARY) by  Dr. Katrinka Blazing.  NECK SOFT TISSUE: NO FOREIGN BODY IN ESOPHAGUS; NO SUBCUTANEOUS AIR.      Assessment & Plan:  Right ear pain  Dysphagia, unspecified(787.20) - Plan: POCT rapid strep A, DG Neck Soft Tissue, Throat culture ([facility])  Nausea and vomiting, vomiting of unspecified type - Plan: DG Neck Soft Tissue  1. Dysphagia:  New. Onset twelve hours after vomiting.  Benign exam.  Soft tissue neck film negative.  Reassurance provided. Most consistent with esophageal irritation post-emesis.  No evidence of esophageal tear by exam or xray.  Rx for magic mouthwash provided for PRN use. Rx for Ibuprofen 600mg  qid PRN for pain.  BRAT diet. RTC for acute worsening. 2.  R otalgia: New. Referred pain from dysphagia.  Rx for Ibuprofen 600mg  provided. 3. Vomiting:  New; now resolved; no recurrence today; benign abdominal exam.    Meds ordered this encounter  Medications  . Alum & Mag Hydroxide-Simeth (MAGIC MOUTHWASH W/LIDOCAINE) SOLN    Sig: Take 5 mLs by mouth 4 (four) times daily as needed for mouth pain.    Dispense:  120 mL    Refill:  0  . ibuprofen (ADVIL,MOTRIN) 600 MG tablet    Sig: Take 1 tablet (600 mg total) by mouth every 6 (six) hours as needed.    Dispense:  30 tablet    Refill:  0    Order Specific Question:  Supervising Provider    Answer:  Eber Hong D [[id number]]    No Follow-up on file.   Nilda Simmer, M.D.  [facility] [address] [place], [place]  [address] [phone] phone [phone] fax

## 2014-04-30 NOTE — Progress Notes (Signed)
23 yr old white male complains of right ear pain for the past day.  Pt states sharp pain starts in his throat that feels as though something is stuck and radiates up to his ear. PS 7.  He states ear has yellow drainage, with minor headaches.  He also states his balance has been off a little with no dizziness.

## 2014-04-30 NOTE — Patient Instructions (Signed)
Dysphagia  Swallowing problems (dysphagia) occur when solids and liquids seem to stick in your throat on the way down to your stomach, or the food takes longer to get to the stomach. Other symptoms include regurgitating food, noises coming from the throat, chest discomfort with swallowing, and a feeling of fullness or the feeling of something being stuck in your throat when swallowing. When blockage in your throat is complete it may be associated with drooling.  CAUSES   Problems with swallowing may occur because of problems with the muscles. The food cannot be propelled in the usual manner into your stomach. You may have ulcers, scar tissue, or inflammation in the tube down which food travels from your mouth to your stomach (esophagus), which blocks food from passing normally into the stomach. Causes of inflammation include:  · Acid reflux from your stomach into your esophagus.  · Infection.  · Radiation treatment for cancer.  · Medicines taken without enough fluids to wash them down into your stomach.  You may have nerve problems that prevent signals from being sent to the muscles of your esophagus to contract and move your food down to your stomach. Globus pharyngeus is a relatively common problem in which there is a sense of an obstruction or difficulty in swallowing, without any physical abnormalities of the swallowing passages being found. This problem usually improves over time with reassurance and testing to rule out other causes.  DIAGNOSIS  Dysphagia can be diagnosed and its cause can be determined by tests in which you swallow a white substance that helps illuminate the inside of your throat (contrast medium) while X-rays are taken. Sometimes a flexible telescope that is inserted down your throat (endoscopy) to look at your esophagus and stomach is used.  TREATMENT   · If the dysphagia is caused by acid reflux or infection, medicines may be used.  · If the dysphagia is caused by problems with your  swallowing muscles, swallowing therapy may be used to help you strengthen your swallowing muscles.  · If the dysphagia is caused by a blockage or mass, procedures to remove the blockage may be done.  HOME CARE INSTRUCTIONS  · Try to eat soft food that is easier to swallow and check your weight on a daily basis to be sure that it is not decreasing.  · Be sure to drink liquids when sitting upright (not lying down).  SEEK MEDICAL CARE IF:  · You are losing weight because you are unable to swallow.  · You are coughing when you drink liquids (aspiration).  · You are coughing up partially digested food.  SEEK IMMEDIATE MEDICAL CARE IF:  · You are unable to swallow your own saliva .  · You are having shortness of breath or a fever, or both.  · You have a hoarse voice along with difficulty swallowing.  MAKE SURE YOU:  · Understand these instructions.  · Will watch your condition.  · Will get help right away if you are not doing well or get worse.  Document Released: 10/05/2000 Document Revised: 06/10/2013 Document Reviewed: 03/27/2013  ExitCare® Patient Information ©2015 [employer]. This information is not intended to replace advice given to you by your health care provider. Make sure you discuss any questions you have with your health care provider.

## 2014-05-01 ENCOUNTER — Telehealth: Payer: Self-pay

## 2014-05-01 NOTE — Telephone Encounter (Signed)
Patient is calling to let Dr Katrinka BlazingSmith know that his condition has got worse.  Please call patient back.   Best#: [phone]

## 2014-05-02 ENCOUNTER — Emergency Department (HOSPITAL_COMMUNITY): Payer: BC Managed Care – PPO

## 2014-05-02 ENCOUNTER — Encounter (HOSPITAL_COMMUNITY): Payer: Self-pay | Admitting: Emergency Medicine

## 2014-05-02 ENCOUNTER — Emergency Department (HOSPITAL_COMMUNITY)
Admission: EM | Admit: 2014-05-02 | Discharge: 2014-05-02 | Disposition: A | Discharge status: Home / Self Care | Payer: BC Managed Care – PPO | Attending: Emergency Medicine | Admitting: Emergency Medicine

## 2014-05-02 DIAGNOSIS — J029 Acute pharyngitis, unspecified: Secondary | ICD-10-CM

## 2014-05-02 DIAGNOSIS — Z79899 Other long term (current) drug therapy: Secondary | ICD-10-CM | POA: Insufficient documentation

## 2014-05-02 DIAGNOSIS — F3289 Other specified depressive episodes: Secondary | ICD-10-CM | POA: Insufficient documentation

## 2014-05-02 DIAGNOSIS — K219 Gastro-esophageal reflux disease without esophagitis: Secondary | ICD-10-CM

## 2014-05-02 DIAGNOSIS — F329 Major depressive disorder, single episode, unspecified: Secondary | ICD-10-CM | POA: Insufficient documentation

## 2014-05-02 DIAGNOSIS — J45909 Unspecified asthma, uncomplicated: Secondary | ICD-10-CM | POA: Insufficient documentation

## 2014-05-02 DIAGNOSIS — I10 Essential (primary) hypertension: Secondary | ICD-10-CM | POA: Insufficient documentation

## 2014-05-02 MED ORDER — SUCRALFATE 1 G PO TABS: 1.0000 g | ORAL_TABLET | Freq: Three times a day (TID) | ORAL | Status: DC

## 2014-05-02 MED ORDER — FAMOTIDINE 20 MG PO TABS
20.0000 mg | ORAL_TABLET | Freq: Once | ORAL | Status: AC
  Administered 2014-05-02: 20 mg via ORAL
  Filled 2014-05-02: qty 1

## 2014-05-02 MED ORDER — SUCRALFATE 1 G PO TABS
1.0000 g | ORAL_TABLET | Freq: Three times a day (TID) | ORAL | Status: DC
  Administered 2014-05-02: 1 g via ORAL
  Filled 2014-05-02 (×5): qty 1

## 2014-05-02 NOTE — Telephone Encounter (Signed)
Pt states that he could not swallow anything.  Advised that pt come in to be seen now or go to ER

## 2014-05-02 NOTE — ED Notes (Signed)
Patient c/o sore throat that started Friday. Per patient went to urgent care on Friday but they were unable to find anything wrong, did not give him any medication. Per patient now pain is getting worse. Per patient painful to swallow but denies any difficulty swallowing or breathing. Denies any fever or cough.

## 2014-05-02 NOTE — Telephone Encounter (Signed)
lmom to cb. 

## 2014-05-02 NOTE — Discharge Instructions (Signed)
Please continue your Protonix. Please add Pepcid 20 mg each morning and evening, and Carafate 3 times daily. Please see the GI specialist listed above, or the GI specialist of your choice as sone as possible for additional evaluation of possible esophagitis related to reflux. Please do not eat after 8 PM. Please avoid hot, spicy, fried related foods. Please elevate the head of your bed on blocks about 6 inches.

## 2014-05-02 NOTE — ED Provider Notes (Signed)
CSN: [id number]     Arrival date & time 05/02/14  1006 History   First MD Initiated Contact with Patient 05/02/14 1024     Chief Complaint  Patient presents with  . Sore Throat     (Consider location/radiation/quality/duration/timing/severity/associated sxs/prior Treatment) HPI Comments: Patient presents to the emergency department with the complaint of sore throat, and a burning sensation from the throat to the mid chest. The patient states that he gets this burning sensation mostly when eating. He states he at times has it when he is not eating. The patient has been diagnosed in the past with some gastroesophageal reflux disease, history GERD with Protonix. The patient states he's been on protonic for quite some time, and that he continues to have increasing problems with pain. The pain now bothers him  when he was attempting to rest. He has not noticed any blood in the saliva. His been no blood in stool. He's not had any previous injuries or procedures to this area. There's been no fever reported. There's no jaw pain, sweats, loss of consciousness, or unusual shortness of breath associated with the burning sensation in the chest.  The history is provided by the patient.    Past Medical History  Diagnosis Date  . Asthma   . Morbid obesity   . Hypertension   . Depression    Past Surgical History  Procedure Laterality Date  . Tonsillectomy and adenoidectomy     Family History  Problem Relation Age of Onset  . Arthritis Mother   . Diabetes Mother   . Alcohol abuse Father   . Arthritis Father   . Hyperlipidemia Father   . Stroke Father   . Hypertension Father   . Mental illness Father   . Diabetes Father   . Arthritis Maternal Grandmother   . Stroke Maternal Grandmother   . Diabetes Maternal Grandmother   . Arthritis Maternal Grandfather   . Heart attack Maternal Grandfather 50  . Diabetes Maternal Grandfather    History  Substance Use Topics  . Smoking status: Never  Smoker   . Smokeless tobacco: Never Used  . Alcohol Use: Yes     Comment: occasionally    Review of Systems  Constitutional: Negative for activity change.       All ROS Neg except as noted in HPI  HENT: Positive for sore throat and trouble swallowing.   Eyes: Negative for photophobia and discharge.  Respiratory: Negative for cough, shortness of breath and wheezing.   Cardiovascular: Negative for chest pain and palpitations.  Gastrointestinal: Negative for abdominal pain and blood in stool.  Genitourinary: Negative for dysuria, frequency and hematuria.  Musculoskeletal: Negative for arthralgias, back pain and neck pain.  Skin: Negative.   Neurological: Negative for dizziness, seizures and speech difficulty.  Psychiatric/Behavioral: Negative for hallucinations and confusion.      Allergies  Ceftin  Home Medications   Prior to Admission medications   Medication Sig Start Date End Date Taking? Authorizing Provider  FLUoxetine (PROZAC) 40 MG capsule Take 80 mg by mouth daily. 03/19/14  Yes Sheliah HatchKatherine E Tabori, MD  ibuprofen (ADVIL,MOTRIN) 600 MG tablet Take 600 mg by mouth every 6 (six) hours as needed for mild pain. 04/30/14  Yes Ethelda ChickKristi M Smith, MD  losartan (COZAAR) 100 MG tablet Take 1 tablet (100 mg total) by mouth daily. 02/15/14  Yes Sheliah HatchKatherine E Tabori, MD  pantoprazole (PROTONIX) 40 MG tablet Take 1 tablet (40 mg total) by mouth daily. 02/02/14  Yes Sheliah HatchKatherine E Tabori,  MD   BP 138/57  Pulse 65  Temp(Src) 97.7 F (36.5 C) (Oral)  Resp 18  Ht 6' (1.829 m)  Wt 350 lb (158.759 kg)  BMI 47.46 kg/m2  SpO2 100% Physical Exam  Nursing note and vitals reviewed. Constitutional: He is oriented to person, place, and time. He appears well-developed and well-nourished.  Non-toxic appearance.  HENT:  Head: Normocephalic.  Right Ear: Tympanic membrane and external ear normal.  Left Ear: Tympanic membrane and external ear normal.  Eyes: EOM and lids are normal. Pupils are equal, round,  and reactive to light.  Neck: Normal range of motion. Neck supple. Carotid bruit is not present.  Cardiovascular: Normal rate, regular rhythm, normal heart sounds, intact distal pulses and normal pulses.   Pulmonary/Chest: Breath sounds normal. No respiratory distress.  Abdominal: Soft. Bowel sounds are normal. There is no tenderness. There is no guarding.  Musculoskeletal: Normal range of motion.  Lymphadenopathy:       Head (right side): No submandibular adenopathy present.       Head (left side): No submandibular adenopathy present.    He has no cervical adenopathy.  Neurological: He is alert and oriented to person, place, and time. He has normal strength. No cranial nerve deficit or sensory deficit.  Skin: Skin is warm and dry.  Psychiatric: He has a normal mood and affect. His speech is normal.    ED Course  Procedures (including critical care time) Labs Review Labs Reviewed - No data to display EKG: normal EKG, normal sinus rhythm, unchanged from previous tracings. Imaging Review Dg Neck Soft Tissue  04/30/2014   CLINICAL DATA:  ear pain and sore throat  EXAM: NECK SOFT TISSUES - 1+ VIEW  COMPARISON:  None.  FINDINGS: The hypopharynx, glottis and proximal trachea appear normal. Epiglottis is normal. There is no prevertebral soft tissue swelling. No foreign body.  IMPRESSION: No foreign body or soft tissue swelling.   Electronically Signed   By: Genevive Bi M.D.   On: 04/30/2014 18:52   Dg Chest 2 View  05/02/2014   CLINICAL DATA:  Sore throat  EXAM: CHEST  2 VIEW  COMPARISON:  02/08/2014  FINDINGS: Normal mediastinum and cardiac silhouette. Normal pulmonary vasculature. No evidence of effusion, infiltrate, or pneumothorax. No acute bony abnormality.  IMPRESSION: No acute cardiopulmonary process.   Electronically Signed   By: Genevive Bi M.D.   On: 05/02/2014 11:38     EKG Interpretation None      MDM I have reviewed the previous emergency department visits. I've also  reviewed the imaging studies related to those visits. Soft tissue neck x-rays on July 10 were negative for any acute changes.  Signs today are well no normal limits. Pulse oximetry is 100% on room air. Electrocardiogram shows a normal sinus rhythm, no acute event noted. Chest x-ray today is negative for any acute cardiopulmonary changes, or chest related changes. Patient was treated in the emergency department with Pepcid and Carafate with some improvement in his discomfort.  The patient is advised to add Pepcid and Carafate to his Protonix. The patient is strongly advised to see gastroenterology for formal workup of the pain it seems to be esophageal in nature.    Final diagnoses:  None    *I have reviewed nursing notes, vital signs, and all appropriate lab and imaging results for this patient.Kathie Dike, PA-C 05/03/14 1007

## 2014-05-03 ENCOUNTER — Ambulatory Visit (INDEPENDENT_AMBULATORY_CARE_PROVIDER_SITE_OTHER): Payer: BC Managed Care – PPO | Admitting: Family Medicine

## 2014-05-03 ENCOUNTER — Encounter: Payer: Self-pay | Admitting: Family Medicine

## 2014-05-03 ENCOUNTER — Telehealth: Payer: Self-pay | Admitting: *Deleted

## 2014-05-03 VITALS — BP 120/82 | HR 69 | Temp 98.2°F | Resp 16 | Wt 365.0 lb

## 2014-05-03 DIAGNOSIS — H6690 Otitis media, unspecified, unspecified ear: Secondary | ICD-10-CM | POA: Insufficient documentation

## 2014-05-03 DIAGNOSIS — H65191 Other acute nonsuppurative otitis media, right ear: Secondary | ICD-10-CM

## 2014-05-03 DIAGNOSIS — H65199 Other acute nonsuppurative otitis media, unspecified ear: Secondary | ICD-10-CM

## 2014-05-03 LAB — CULTURE, GROUP A STREP: Organism ID, Bacteria: NORMAL

## 2014-05-03 MED ORDER — AMOXICILLIN 875 MG PO TABS: 875.0000 mg | ORAL_TABLET | Freq: Two times a day (BID) | ORAL | Status: DC

## 2014-05-03 NOTE — Progress Notes (Signed)
   Subjective:    Patient ID: Omar Krause, male    DOB: [birth year], 23 y.o.   MRN: [id number]  HPI Sore throat- sxs started Friday.  Severe.  Went to UC on Friday, had negative rapid strep and subsequent culture.  Went to ER yesterday w/o dx or symptom improvement.  No fever.  R ear is now painful but only when swallowing.  No drainage.  No facial pain/pressure.  No known sick contacts.  + fatigue.   Review of Systems For ROS see HPI     Objective:   Physical Exam  Vitals reviewed. Constitutional: He appears well-developed and well-nourished. No distress.  HENT:  Head: Normocephalic and atraumatic.  No TTP over sinuses + turbinate edema + PND R TM dull w/ visible fluid and poor landmarks  Eyes: Conjunctivae and EOM are normal. Pupils are equal, round, and reactive to light.  Neck: Normal range of motion. Neck supple.  Cardiovascular: Normal rate, regular rhythm and normal heart sounds.   Pulmonary/Chest: Effort normal and breath sounds normal. No respiratory distress. He has no wheezes.  Lymphadenopathy:    He has no cervical adenopathy.  Skin: Skin is warm and dry.          Assessment & Plan:

## 2014-05-03 NOTE — Progress Notes (Signed)
Pre visit review using our clinic review tool, if applicable. No additional management support is needed unless otherwise documented below in the visit note. 

## 2014-05-03 NOTE — Assessment & Plan Note (Signed)
New.  Pt's PE today consistent w/ infxn.  Start abx.  Reviewed supportive care and red flags that should prompt return.  Pt expressed understanding and is in agreement w/ plan.

## 2014-05-03 NOTE — Telephone Encounter (Signed)
[facility] Triage Call Report Triage Record Num: [id number] Operator: Caswell Corwinynthia Bowlin Patient Name: Omar Krause Call Date & Time: 05/02/2014 9:29:20AM Patient Phone: [phone] PCP: Marga MelnickWilliam Hopper Patient Gender: Male PCP Fax : [phone] Patient DOB: 06-14-1991 [facility] Name[facility] Reason for Call: Caller: Tedd/Patient; PCP: Other; CB#: [phone]; Call regarding Sore Throat that started 04/30/14 AM and he went to an [facility] 04/30/14 and they couldn't find anything wrong and instructed to call back if SX worsened. This morning throat and esophagus feels tighter and more difficult to swallow. Triaged Swallowing Difficulty and needs to be seen in the ED as it seems something seems to be stuck and not choking. Travel care and call back instructions given. Pt will go [facility]. Protocol(s) Used: Swallowing Difficulty Recommended Outcome per Protocol: See ED Immediately Reason for Outcome: Something seems to be stuck AND not choking Care Advice: ~ Another adult should drive. ~ Do not give the patient anything to eat or drink. ~ Sit upright or raise head with pillows. 05/02/2014 9:39:31AM Page 1 of 1 [email or web address]

## 2014-05-03 NOTE — Patient Instructions (Signed)
Follow up as needed Start the Amoxicillin twice daily Drink plenty of fluids Alternate tylenol/ibuprofen as needed for pain If no improvement after antibiotics, we'll need to consider mono testing Call with any questions or concerns Hang in there!!

## 2014-05-05 ENCOUNTER — Encounter: Payer: Self-pay | Admitting: Family Medicine

## 2014-05-06 NOTE — ED Provider Notes (Signed)
Medical screening examination/treatment/procedure(s) were performed by non-physician practitioner and as supervising physician I was immediately available for consultation/collaboration.   EKG Interpretation   Date/Time:  Sunday May 02 2014 11:12:57 EDT Ventricular Rate:  69 PR Interval:  166 QRS Duration: 88 QT Interval:  368 QTC Calculation: 394 R Axis:   70 Text Interpretation:  Sinus rhythm ED PHYSICIAN INTERPRETATION AVAILABLE  IN [facility] Confirmed by TEST, Record ([id number]) on 05/04/2014 8:14:07  AM       Donnetta HutchingBrian Zahara Rembert, MD 05/06/14 [id number]

## 2014-06-07 ENCOUNTER — Ambulatory Visit (INDEPENDENT_AMBULATORY_CARE_PROVIDER_SITE_OTHER): Payer: BC Managed Care – PPO | Admitting: Medical

## 2014-06-07 VITALS — BP 145/81 | HR 84 | Temp 97.7°F | Wt 361.2 lb

## 2014-06-07 DIAGNOSIS — B839 Helminthiasis, unspecified: Secondary | ICD-10-CM

## 2014-06-07 NOTE — Patient Instructions (Addendum)
For you worm which you saw I want you to get mebendazole otc 400 mg by mouth and take three times a day for 3 days. I talked with you pharmacy and medication is now otc and other type med option not in stock. So please get medication and do preventative hygiene as described. If after treatment reoccurs can do second treatment. Follow up as needed.

## 2014-06-07 NOTE — Progress Notes (Signed)
   Subjective:    Patient ID: Omar Krause, male    DOB: [birth year], 23 y.o.   MRN: [id number]  HPI  Pt states that he was using bathroom the other day and he actually saw a worm in his stool on saturday. He states was 2-3 inches in length and actually was moving. Pt states he flushed it down the toilet. No rectal itching. No known person with worms. No recent traveling out of the country. This occurred Saturday morning and non occurred since.    Review of Systems  Constitutional: Negative for fever, chills and fatigue.  Respiratory: Negative for chest tightness and wheezing.   Cardiovascular: Negative for chest pain and palpitations.  Gastrointestinal: Negative for nausea, vomiting, abdominal pain, diarrhea, constipation, blood in stool, abdominal distention, anal bleeding and rectal pain.  Genitourinary: Negative for dysuria, urgency, frequency, hematuria, flank pain and testicular pain.  Musculoskeletal: Negative.   Skin: Negative.   Neurological: Negative.   Hematological: Negative for adenopathy. Does not bruise/bleed easily.   Only positive was worm in toilet water.    Objective:   Physical Exam  Constitutional: He is oriented to person, place, and time. He appears well-developed and well-nourished. No distress.  Morbid obese.  Eyes: Right eye exhibits no discharge. Left eye exhibits no discharge.  Neck: Normal range of motion. Neck supple.  Cardiovascular: Normal rate, regular rhythm and normal heart sounds.  Exam reveals no gallop and no friction rub.   No murmur heard. Pulmonary/Chest: Effort normal and breath sounds normal. No respiratory distress. He has no wheezes. He has no rales. He exhibits no tenderness.  Abdominal: Soft. Bowel sounds are normal. He exhibits no distension and no mass. There is no tenderness. There is no rebound and no guarding.  Neurological: He is alert and oriented to person, place, and time. No cranial nerve deficit. Coordination normal.  Skin:  Skin is warm and dry. No rash noted. He is not diaphoretic. No erythema. No pallor.  Psychiatric: He has a normal mood and affect. His behavior is normal. Judgment and thought content normal.            Assessment & Plan:

## 2014-06-07 NOTE — Assessment & Plan Note (Signed)
Pt states saw one on Saturday when he passed bm. None since. He states was 2-3 inches in length. So I called pharmacy and no longer prescription mebendazole. Now otc. So advised 400 mg tid x 3 days. See AVS as well.

## 2014-06-08 ENCOUNTER — Other Ambulatory Visit: Payer: Self-pay | Admitting: Family Medicine

## 2014-06-08 ENCOUNTER — Telehealth: Payer: Self-pay

## 2014-06-08 NOTE — Telephone Encounter (Signed)
Med filled.  

## 2014-06-08 NOTE — Telephone Encounter (Signed)
Patient and mother present to the office to discuss medication Rx'd on previous day. They were unable to find any OTC medication for treatment of worms. While in office I called 4 pharmacies for them. [facility] gave recommendations for metronitazole. Per Ramon DredgeEdward, send in for 10 day coverage. Advise patient if does not clear to get a sample for the lab to test. Mother verbalizes understanding of plan.

## 2014-06-08 NOTE — Telephone Encounter (Signed)
Caller name:Gaye Relation to pt:mom Call back number:[phone] Pharmacy:[facility]  Reason for call: Gaye called and said that they were having trouble getting Cagney's medicine, she had been told it is over the counter, they done carry anymore and several other things. She wants to know if you could help with this, Argentina PonderFerman is getting anxious because of having trouble getting his prescription.

## 2014-06-15 ENCOUNTER — Other Ambulatory Visit: Payer: Self-pay | Admitting: Family Medicine

## 2014-06-15 NOTE — Telephone Encounter (Signed)
Med filled.  

## 2014-07-11 ENCOUNTER — Other Ambulatory Visit: Payer: Self-pay | Admitting: Family Medicine

## 2014-07-12 NOTE — Telephone Encounter (Signed)
Med filled.  

## 2014-07-25 ENCOUNTER — Encounter (HOSPITAL_COMMUNITY): Payer: Self-pay | Admitting: Emergency Medicine

## 2014-07-25 ENCOUNTER — Emergency Department (HOSPITAL_COMMUNITY): Payer: BC Managed Care – PPO

## 2014-07-25 ENCOUNTER — Emergency Department (HOSPITAL_COMMUNITY)
Admission: EM | Admit: 2014-07-25 | Discharge: 2014-07-25 | Disposition: A | Discharge status: Home / Self Care | Payer: BC Managed Care – PPO | Attending: Emergency Medicine | Admitting: Emergency Medicine

## 2014-07-25 DIAGNOSIS — Z79899 Other long term (current) drug therapy: Secondary | ICD-10-CM | POA: Diagnosis not present

## 2014-07-25 DIAGNOSIS — Y9389 Activity, other specified: Secondary | ICD-10-CM | POA: Diagnosis not present

## 2014-07-25 DIAGNOSIS — W1849XA Other slipping, tripping and stumbling without falling, initial encounter: Secondary | ICD-10-CM | POA: Insufficient documentation

## 2014-07-25 DIAGNOSIS — F329 Major depressive disorder, single episode, unspecified: Secondary | ICD-10-CM | POA: Insufficient documentation

## 2014-07-25 DIAGNOSIS — S93402A Sprain of unspecified ligament of left ankle, initial encounter: Secondary | ICD-10-CM | POA: Diagnosis not present

## 2014-07-25 DIAGNOSIS — I10 Essential (primary) hypertension: Secondary | ICD-10-CM | POA: Diagnosis not present

## 2014-07-25 DIAGNOSIS — J45909 Unspecified asthma, uncomplicated: Secondary | ICD-10-CM | POA: Diagnosis not present

## 2014-07-25 DIAGNOSIS — Y9289 Other specified places as the place of occurrence of the external cause: Secondary | ICD-10-CM | POA: Diagnosis not present

## 2014-07-25 DIAGNOSIS — S99912A Unspecified injury of left ankle, initial encounter: Secondary | ICD-10-CM | POA: Diagnosis present

## 2014-07-25 NOTE — ED Notes (Signed)
Patient slipped on porch and twisted left foot and ankle. States that foot fells a little numb. Patient is able to move toes and foot

## 2014-07-25 NOTE — Discharge Instructions (Signed)
Ankle Sprain  An ankle sprain is an injury to the strong, fibrous tissues (ligaments) that hold your ankle bones together.   HOME CARE   · Put ice on your ankle for 1-2 days or as told by your doctor.  ¨ Put ice in a plastic bag.  ¨ Place a towel between your skin and the bag.  ¨ Leave the ice on for 15-20 minutes at a time, every 2 hours while you are awake.  · Only take medicine as told by your doctor.  · Raise (elevate) your injured ankle above the level of your heart as much as possible for 2-3 days.  · Use crutches if your doctor tells you to. Slowly put your own weight on the affected ankle. Use the crutches until you can walk without pain.  · If you have a plaster splint:  ¨ Do not rest it on anything harder than a pillow for 24 hours.  ¨ Do not put weight on it.  ¨ Do not get it wet.  ¨ Take it off to shower or bathe.  · If given, use an elastic wrap or support stocking for support. Take the wrap off if your toes lose feeling (numb), tingle, or turn cold or blue.  · If you have an air splint:  ¨ Add or let out air to make it comfortable.  ¨ Take it off at night and to shower and bathe.  ¨ Wiggle your toes and move your ankle up and down often while you are wearing it.  GET HELP IF:  · You have rapidly increasing bruising or puffiness (swelling).  · Your toes feel very cold.  · You lose feeling in your foot.  · Your medicine does not help your pain.  GET HELP RIGHT AWAY IF:   · Your toes lose feeling (numb) or turn blue.  · You have severe pain that is increasing.  MAKE SURE YOU:   · Understand these instructions.  · Will watch your condition.  · Will get help right away if you are not doing well or get worse.  Document Released: 03/26/2008 Document Revised: 02/22/2014 Document Reviewed: 04/21/2012  ExitCare® Patient Information ©2015 [employer]. This information is not intended to replace advice given to you by your health care provider. Make sure you discuss any questions you have with your health care  provider.

## 2014-07-25 NOTE — ED Notes (Signed)
Patient with no complaints at this time. Respirations even and unlabored. Skin warm/dry. Discharge instructions reviewed with patient at this time. Patient given opportunity to voice concerns/ask questions. Patient discharged at this time and left Emergency Department with steady gait.   

## 2014-07-25 NOTE — ED Provider Notes (Signed)
CSN: [id number]     Arrival date & time 07/25/14  1332 History   This chart was scribed for a non-physician practitioner, Maxwell Caul, PA-C working with Vanetta Mulders, MD by [place] Peace, ED Scribe. The patient was seen in APFT22/APFT22. The patient's care was started at 3:46 PM.    Chief Complaint  Patient presents with  . Ankle Pain      Patient is a 23 y.o. male presenting with ankle pain. The history is provided by the patient. No language interpreter was used.  Ankle Pain Associated symptoms: no fever    HPI Comments: Omar Krause is a 23 y.o. male who presents to the Emergency Department complaining of left ankle pain onset 12:30 PM with associated swelling that occurred when pt slipped on the porch and bent his ankle backwards. Pt also reports some numbness to affected area. Pt notes pain is specifically around medial aspect of ankle. He states the pain is worse with weight bearing.  He has not tried any therapies prior to coming to the ED.    Past Medical History  Diagnosis Date  . Asthma   . Morbid obesity   . Hypertension   . Depression    Past Surgical History  Procedure Laterality Date  . Tonsillectomy and adenoidectomy     Family History  Problem Relation Age of Onset  . Arthritis Mother   . Diabetes Mother   . Alcohol abuse Father   . Arthritis Father   . Hyperlipidemia Father   . Stroke Father   . Hypertension Father   . Mental illness Father   . Diabetes Father   . Arthritis Maternal Grandmother   . Stroke Maternal Grandmother   . Diabetes Maternal Grandmother   . Arthritis Maternal Grandfather   . Heart attack Maternal Grandfather 50  . Diabetes Maternal Grandfather    History  Substance Use Topics  . Smoking status: Never Smoker   . Smokeless tobacco: Never Used  . Alcohol Use: Yes     Comment: occasionally    Review of Systems  Constitutional: Negative for fever and chills.  Gastrointestinal: Negative for nausea and vomiting.   Genitourinary: Negative for dysuria and difficulty urinating.  Musculoskeletal: Positive for arthralgias and joint swelling.       Left ankle pain with associated swelling.  Skin: Negative for color change and wound.  Neurological: Positive for numbness. Negative for headaches.  All other systems reviewed and are negative.     Allergies  Ceftin  Home Medications   Prior to Admission medications   Medication Sig Start Date End Date Taking? Authorizing Provider  FLUoxetine (PROZAC) 40 MG capsule TAKE 2 CAPSULES BY MOUTH EVERY DAY 07/12/14   [name], MD  ibuprofen (ADVIL,MOTRIN) 600 MG tablet Take 600 mg by mouth every 6 (six) hours as needed for mild pain. 04/30/14   Ethelda Chick, MD  losartan (COZAAR) 100 MG tablet TAKE 1 TABLET (100 MG TOTAL) BY MOUTH DAILY. 06/15/14   [name], MD  metronidazole (FLAGYL ER) 750 MG 24 hr tablet Take 750 mg by mouth 3 (three) times daily.    Historical Provider, MD  pantoprazole (PROTONIX) 40 MG tablet TAKE 1 TABLET (40 MG TOTAL) BY MOUTH DAILY. 06/08/14   [name], MD   Pulse 70  Temp(Src) 97.9 F (36.6 C)  Ht 6' (1.829 m)  Wt 350 lb (158.759 kg)  BMI 47.46 kg/m2  SpO2 98% Physical Exam  Nursing note and vitals reviewed.  Constitutional: He is oriented to person, place, and time. He appears well-developed and well-nourished. No distress.  HENT:  Head: Normocephalic and atraumatic.  Neck: Neck supple.  Cardiovascular: Normal rate, regular rhythm, normal heart sounds and intact distal pulses.   No murmur heard. Pulmonary/Chest: Effort normal and breath sounds normal. No respiratory distress.  Musculoskeletal: Normal range of motion. He exhibits tenderness.  Tenderness to anterior and medial left ankle. No significant soft tissue swelling. No bony deformity. No proximal tenderness. DP pulse and gross sensation intact. CR < 2sec  Neurological: He is alert and oriented to person, place, and time. He exhibits normal  muscle tone. Coordination normal.  Skin: Skin is warm and dry.  Psychiatric: He has a normal mood and affect. His behavior is normal.    ED Course  Procedures (including critical care time) Labs Review Labs Reviewed - No data to display    Imaging Review Dg Ankle Complete Left  07/25/2014   CLINICAL DATA:  Pt states he slipped and fell off of his porch today. Pt states "my Lt foot felt like it went behind my leg when I fell." Pt c/o medial Lt ankle pain and swelling at this time. Pain radiated into metatarsals of Lt foot. Generalized swelling of Lt ankle. Initial encounter.  EXAM: LEFT ANKLE COMPLETE - 3+ VIEW  COMPARISON:  04/04/2014 foot films.  FINDINGS: Mild bimalleolar soft tissue swelling. No acute fracture or dislocation. Base of fifth metatarsal and talar dome intact.  IMPRESSION: Soft tissue swelling, without acute osseous abnormality.   Electronically Signed   By: Jeronimo GreavesKyle  Talbot M.D.   On: 07/25/2014 15:08     EKG Interpretation None     Medications - No data to display  3:49 PM- Treatment plan was discussed with patient who verbalizes understanding and agrees.   MDM   Final diagnoses:  Ankle sprain, left, initial encounter    Patient with likely sprain of the ankle.  NV intact.  ASO applied,  Pain improved.  He agrees to elevate, ice and close orthopedic f/u in one week if not improving.  Agrees to ibuprofen if needed.    I personally performed the services described in this documentation, which was scribed in my presence. The recorded information has been reviewed and is accurate.   Sharen Youngren L. Trisha Mangleriplett, PA-C 07/26/14 2155

## 2014-07-26 NOTE — ED Provider Notes (Signed)
Medical screening examination/treatment/procedure(s) were performed by non-physician practitioner and as supervising physician I was immediately available for consultation/collaboration.   EKG Interpretation None        Vanetta MuldersScott Edelmira Gallogly, MD 07/26/14 2302

## 2014-08-11 ENCOUNTER — Ambulatory Visit (INDEPENDENT_AMBULATORY_CARE_PROVIDER_SITE_OTHER): Payer: BC Managed Care – PPO | Admitting: Physician Assistant

## 2014-08-11 VITALS — BP 128/86 | HR 68 | Temp 97.2°F | Resp 20 | Ht 71.0 in | Wt 350.4 lb

## 2014-08-11 DIAGNOSIS — J029 Acute pharyngitis, unspecified: Secondary | ICD-10-CM

## 2014-08-11 DIAGNOSIS — R059 Cough, unspecified: Secondary | ICD-10-CM

## 2014-08-11 DIAGNOSIS — J069 Acute upper respiratory infection, unspecified: Secondary | ICD-10-CM

## 2014-08-11 DIAGNOSIS — H6122 Impacted cerumen, left ear: Secondary | ICD-10-CM

## 2014-08-11 DIAGNOSIS — R05 Cough: Secondary | ICD-10-CM

## 2014-08-11 LAB — POCT RAPID STREP A (OFFICE): RAPID STREP A SCREEN: NEGATIVE

## 2014-08-11 MED ORDER — GUAIFENESIN ER 1200 MG PO TB12: 1.0000 | ORAL_TABLET | Freq: Two times a day (BID) | ORAL | Status: DC | PRN

## 2014-08-11 MED ORDER — IPRATROPIUM BROMIDE 0.03 % NA SOLN: 2.0000 | Freq: Two times a day (BID) | NASAL | Status: DC

## 2014-08-11 MED ORDER — BENZONATATE 100 MG PO CAPS: 100.0000 mg | ORAL_CAPSULE | Freq: Three times a day (TID) | ORAL | Status: DC | PRN

## 2014-08-11 NOTE — Patient Instructions (Signed)
Your strep test was negative today, we sent a culture for confirmation. We will contact you if it is positive in a few days. Please take the Tessalon up to three times per day as needed for cough.  Please use the nasacort up to twice daily as needed. You will do 1 spray in each nostril, twice daily as needed. Remember to lean forward when spraying, inhale after each spray, and use your opposite hand to spray.  Please take the mucinex up to twice daily as needed for congestion.  Please take tylenol or ibuprofen as needed for your sore throat and ear pain. Get plenty of rest and drink plenty of fluids. Return to clinic if symptoms worsen or if they have not resolved in 1 week.

## 2014-08-11 NOTE — Progress Notes (Signed)
Subjective:    Patient ID: Rose PhiFurman Yeomans III, male    DOB: 09-Apr-1991, 23 y.o.   MRN: [id number]  Neena RhymesKatherine Tabori, MD  Chief Complaint  Patient presents with  . Cough    x 2 days--productive--did not notice color--no fever-had some chills  . Sore Throat  . Nasal Congestion   Patient Active Problem List   Diagnosis Date Noted  . HTN (hypertension) 02/02/2014  . Nausea alone 02/02/2014  . Generalized anxiety disorder 02/02/2014  . Morbid obesity 07/08/2013    Prior to Admission medications   Medication Sig Start Date End Date Taking? Authorizing Provider  FLUoxetine (PROZAC) 40 MG capsule Take 80 mg by mouth daily.   Yes Historical Provider, MD  losartan (COZAAR) 100 MG tablet Take 100 mg by mouth daily.   Yes Historical Provider, MD  Multiple Vitamin (MULTIVITAMIN WITH MINERALS) TABS tablet Take 1 tablet by mouth daily.   Yes Historical Provider, MD  pantoprazole (PROTONIX) 40 MG tablet Take 40 mg by mouth daily.   Yes Historical Provider, MD   Medications, allergies, past medical history, surgical history, family history, social history and problem list reviewed and updated.  Cough  Sore Throat  Associated symptoms include coughing.   23 yom with PMH GAD and HTN presents with cough, sore throat, and nasal congestion. Symptoms started 4 days ago. Sore throat initially then cough and nasal congestion started yesterday. Cough productive past day, he is unsure of sputum appearance. Has kept him up at night. No rhinorrhea. Right ear pain. Episode of left sided CP last night while trying to sleep. Went away after 10 minutes, he had been coughing prior to the CP starting. No exertional CP. Chills past couple nights. No abd pain, no recent diarrhea, no N/V. No recent antibiotics, no known sick contacts.   He took nyquil last night but otherwise nothing.   Review of Systems  Respiratory: Positive for cough.    No SOB, no fever.     Objective:   Physical Exam  Constitutional: He  is oriented to person, place, and time. He appears well-developed and well-nourished.  BP 128/86  Pulse 68  Temp(Src) 97.2 F (36.2 C) (Oral)  Resp 20  Ht 5\' 11"  (1.803 m)  Wt 350 lb 6.4 oz (158.94 kg)  BMI 48.89 kg/m2  SpO2 98%   HENT:  Head: Normocephalic and atraumatic.  Right Ear: Hearing, external ear and ear canal normal. Tympanic membrane is not erythematous. A middle ear effusion is present.  Nose: No mucosal edema or rhinorrhea. Right sinus exhibits no maxillary sinus tenderness and no frontal sinus tenderness. Left sinus exhibits maxillary sinus tenderness. Left sinus exhibits no frontal sinus tenderness.  Mouth/Throat: Uvula is midline, oropharynx is clear and moist and mucous membranes are normal. No oropharyngeal exudate, posterior oropharyngeal edema, posterior oropharyngeal erythema or tonsillar abscesses.  Left ear with cerumen impaction.  Right ear with mild serous fluid behind TM.   Eyes: Conjunctivae, EOM and lids are normal. Pupils are equal, round, and reactive to light.  Cardiovascular: Normal rate, regular rhythm and normal heart sounds.  Exam reveals no gallop.   No murmur heard. Pulmonary/Chest: Effort normal and breath sounds normal. He has no decreased breath sounds. He has no wheezes. He has no rhonchi. He has no rales.  Lymphadenopathy:       Head (right side): No submental, no submandibular and no tonsillar adenopathy present.       Head (left side): No submental, no submandibular and no tonsillar adenopathy  present.  Neurological: He is alert and oriented to person, place, and time.  Skin: Skin is warm and dry.  Psychiatric: He has a normal mood and affect. His speech is normal.   Cerumen impaction completed for left ear. TM visualized afterward without erythema or bulging.   Results for orders placed in visit on 08/11/14  POCT RAPID STREP A (OFFICE)      Result Value Ref Range   Rapid Strep A Screen Negative  Negative      Assessment & Plan:   [age]  yom with PMH GAD, HTN presents with 4 day history of sore throat, productive cough, and congestion.   Sore throat - Plan: Culture, Group A Strep, POCT rapid strep A --Rapid strep neg, cx sent for confirmation --Tylenol/nsaids prn  URI (upper respiratory infection) - Plan: Guaifenesin (MUCINEX MAXIMUM STRENGTH) 1200 MG TB12 --Mucinex sent for congestion --Atrovent sent for congestion --Rest/fluids  Cough - Plan: benzonatate (TESSALON) 100 MG capsule, Guaifenesin (MUCINEX MAXIMUM STRENGTH) 1200 MG TB12 --Tessalon prn for cough/sleep  Cerumen impaction, left --Flushed in clinic, TM clear post    Donnajean Lopesodd M. Rayhaan Huster, PA-C Physician Assistant-Certified [facility]  08/11/2014 2:18 PM

## 2014-08-11 NOTE — Progress Notes (Signed)
I have discussed this case with Mr. McVeigh, PA-C and agree.  

## 2014-08-13 ENCOUNTER — Encounter (HOSPITAL_COMMUNITY): Payer: Self-pay | Admitting: Emergency Medicine

## 2014-08-13 ENCOUNTER — Emergency Department (HOSPITAL_COMMUNITY)
Admission: EM | Admit: 2014-08-13 | Discharge: 2014-08-13 | Discharge status: Against Medical Advice | Payer: BC Managed Care – PPO | Attending: Emergency Medicine | Admitting: Emergency Medicine

## 2014-08-13 ENCOUNTER — Encounter: Payer: Self-pay | Admitting: Family Medicine

## 2014-08-13 ENCOUNTER — Ambulatory Visit (INDEPENDENT_AMBULATORY_CARE_PROVIDER_SITE_OTHER): Payer: BC Managed Care – PPO | Admitting: Family Medicine

## 2014-08-13 VITALS — BP 130/84 | HR 68 | Temp 98.1°F | Resp 17 | Ht 72.0 in | Wt 351.1 lb

## 2014-08-13 DIAGNOSIS — F419 Anxiety disorder, unspecified: Secondary | ICD-10-CM | POA: Diagnosis not present

## 2014-08-13 DIAGNOSIS — Z008 Encounter for other general examination: Secondary | ICD-10-CM | POA: Diagnosis present

## 2014-08-13 DIAGNOSIS — F329 Major depressive disorder, single episode, unspecified: Secondary | ICD-10-CM | POA: Insufficient documentation

## 2014-08-13 DIAGNOSIS — F411 Generalized anxiety disorder: Secondary | ICD-10-CM

## 2014-08-13 DIAGNOSIS — Z79899 Other long term (current) drug therapy: Secondary | ICD-10-CM | POA: Diagnosis not present

## 2014-08-13 DIAGNOSIS — I10 Essential (primary) hypertension: Secondary | ICD-10-CM | POA: Diagnosis not present

## 2014-08-13 DIAGNOSIS — J45909 Unspecified asthma, uncomplicated: Secondary | ICD-10-CM | POA: Diagnosis not present

## 2014-08-13 DIAGNOSIS — F32A Depression, unspecified: Secondary | ICD-10-CM

## 2014-08-13 DIAGNOSIS — R45850 Homicidal ideations: Secondary | ICD-10-CM

## 2014-08-13 LAB — COMPREHENSIVE METABOLIC PANEL
ALK PHOS: 80 U/L (ref 39–117)
ALT: 57 U/L — AB (ref 0–53)
AST: 37 U/L (ref 0–37)
Albumin: 4.1 g/dL (ref 3.5–5.2)
Anion gap: 14 (ref 5–15)
BUN: 7 mg/dL (ref 6–23)
CALCIUM: 9 mg/dL (ref 8.4–10.5)
CO2: 24 meq/L (ref 19–32)
Chloride: 101 mEq/L (ref 96–112)
Creatinine, Ser: 0.78 mg/dL (ref 0.50–1.35)
GFR calc non Af Amer: >90 mL/min (ref >90)
GLUCOSE: 91 mg/dL (ref 70–99)
Potassium: 3.9 mEq/L (ref 3.7–5.3)
SODIUM: 139 meq/L (ref 137–147)
Total Bilirubin: 0.2 mg/dL — ABNORMAL LOW (ref 0.3–1.2)
Total Protein: 7.8 g/dL (ref 6.0–8.3)

## 2014-08-13 LAB — CBC
HCT: 46.9 % (ref 39.0–52.0)
Hemoglobin: 15.7 g/dL (ref 13.0–17.0)
MCH: 29.4 pg (ref 26.0–34.0)
MCHC: 33.5 g/dL (ref 30.0–36.0)
MCV: 87.8 fL (ref 78.0–100.0)
Platelets: 231 10*3/uL (ref 150–400)
RBC: 5.34 MIL/uL (ref 4.22–5.81)
RDW: 13.8 % (ref 11.5–15.5)
WBC: 8.6 10*3/uL (ref 4.0–10.5)

## 2014-08-13 LAB — RAPID URINE DRUG SCREEN, HOSP PERFORMED
AMPHETAMINES: NOT DETECTED
Barbiturates: NOT DETECTED
Benzodiazepines: NOT DETECTED
Cocaine: NOT DETECTED
Opiates: NOT DETECTED
TETRAHYDROCANNABINOL: POSITIVE — AB

## 2014-08-13 LAB — CULTURE, GROUP A STREP: Organism ID, Bacteria: NORMAL

## 2014-08-13 LAB — SALICYLATE LEVEL

## 2014-08-13 LAB — ACETAMINOPHEN LEVEL: Acetaminophen (Tylenol), Serum: <15 ug/mL (ref 10–30)

## 2014-08-13 LAB — ETHANOL: Alcohol, Ethyl (B): <11 mg/dL (ref 0–11)

## 2014-08-13 MED ORDER — ALUM & MAG HYDROXIDE-SIMETH 200-200-20 MG/5ML PO SUSP: 30.0000 mL | ORAL | Status: DC | PRN

## 2014-08-13 MED ORDER — ACETAMINOPHEN 325 MG PO TABS: 650.0000 mg | ORAL_TABLET | ORAL | Status: DC | PRN

## 2014-08-13 MED ORDER — ZOLPIDEM TARTRATE 10 MG PO TABS: 10.0000 mg | ORAL_TABLET | Freq: Every evening | ORAL | Status: DC | PRN

## 2014-08-13 MED ORDER — ONDANSETRON HCL 4 MG PO TABS: 4.0000 mg | ORAL_TABLET | Freq: Three times a day (TID) | ORAL | Status: DC | PRN

## 2014-08-13 MED ORDER — IBUPROFEN 200 MG PO TABS: 600.0000 mg | ORAL_TABLET | Freq: Three times a day (TID) | ORAL | Status: DC | PRN

## 2014-08-13 MED ORDER — NICOTINE 21 MG/24HR TD PT24: 21.0000 mg | MEDICATED_PATCH | Freq: Every day | TRANSDERMAL | Status: DC

## 2014-08-13 MED ORDER — LORAZEPAM 1 MG PO TABS: 1.0000 mg | ORAL_TABLET | Freq: Three times a day (TID) | ORAL | Status: DC | PRN
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
## 2014-08-13 NOTE — Progress Notes (Signed)
Pre visit review using our clinic review tool, if applicable. No additional management support is needed unless otherwise documented below in the visit note. 

## 2014-08-13 NOTE — BH Assessment (Signed)
[facility] Assessment Progress Note   This clinician went to see patient at 20:04.  [facility] [place] [facility] [place] no [place] was there.  As clinician had walked through the ED initially to get to office he noticed patient and mother in hall C.  This was about 19:56.  Clinician went to the lobby and asked [employer] officer if he had noticed them leave and he said he did.  [facility] left [facility] AMA at this time.

## 2014-08-13 NOTE — Patient Instructions (Signed)
Please go immediately to Behavioral Health at [address], [place] [phone] (off [address] behind the [facility]) They are aware that you are coming We will try and get this anxiety and anger that you are feeling under control Call with any questions or concerns Hang in there!
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
## 2014-08-13 NOTE — ED Notes (Signed)
[facility] [place] by [facility].

## 2014-08-13 NOTE — ED Notes (Addendum)
[facility]. Pt walked out of ED with his mother. Dr. Lynelle DoctorKnapp notified.

## 2014-08-13 NOTE — ED Notes (Signed)
Attempted to call pt on cell phone and was unable to leave message. Attempted to call pt's emergency contact number but was unable to reach contact. Dr. Lynelle DoctorKnapp notified.
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
## 2014-08-18 ENCOUNTER — Encounter: Payer: Self-pay | Admitting: Family Medicine

## 2014-08-18 ENCOUNTER — Ambulatory Visit (INDEPENDENT_AMBULATORY_CARE_PROVIDER_SITE_OTHER): Payer: BC Managed Care – PPO | Admitting: Family Medicine

## 2014-08-18 VITALS — BP 140/90 | HR 79 | Temp 98.1°F | Resp 16 | Wt 350.1 lb

## 2014-08-18 DIAGNOSIS — I10 Essential (primary) hypertension: Secondary | ICD-10-CM

## 2014-08-18 DIAGNOSIS — F411 Generalized anxiety disorder: Secondary | ICD-10-CM

## 2014-08-18 DIAGNOSIS — R351 Nocturia: Secondary | ICD-10-CM

## 2014-08-18 LAB — BASIC METABOLIC PANEL
BUN: 8 mg/dL (ref 6–23)
CALCIUM: 9.2 mg/dL (ref 8.4–10.5)
CO2: 19 mEq/L (ref 19–32)
Chloride: 106 mEq/L (ref 96–112)
Creatinine, Ser: 0.8 mg/dL (ref 0.4–1.5)
GFR: 122.55 mL/min (ref >60.00)
Glucose, Bld: 75 mg/dL (ref 70–99)
Potassium: 3.6 mEq/L (ref 3.5–5.1)
SODIUM: 139 meq/L (ref 135–145)

## 2014-08-18 LAB — POCT URINALYSIS DIPSTICK
Bilirubin, UA: NEGATIVE
Blood, UA: NEGATIVE
Glucose, UA: NEGATIVE
Ketones, UA: NEGATIVE
LEUKOCYTES UA: NEGATIVE
Nitrite, UA: NEGATIVE
PROTEIN UA: NEGATIVE
Spec Grav, UA: >=1.03
UROBILINOGEN UA: 2
pH, UA: 6

## 2014-08-18 LAB — HEPATIC FUNCTION PANEL
ALBUMIN: 4.1 g/dL (ref 3.5–5.2)
ALK PHOS: 76 U/L (ref 39–117)
ALT: 66 U/L — AB (ref 0–53)
AST: 46 U/L — AB (ref 0–37)
Bilirubin, Direct: 0.1 mg/dL (ref 0.0–0.3)
Total Bilirubin: 0.7 mg/dL (ref 0.2–1.2)
Total Protein: 8 g/dL (ref 6.0–8.3)

## 2014-08-18 LAB — CBC WITH DIFFERENTIAL/PLATELET
BASOS ABS: 0.1 10*3/uL (ref 0.0–0.1)
Basophils Relative: 0.5 % (ref 0.0–3.0)
EOS ABS: 0.2 10*3/uL (ref 0.0–0.7)
Eosinophils Relative: 1.5 % (ref 0.0–5.0)
HCT: 46.2 % (ref 39.0–52.0)
Hemoglobin: 15.4 g/dL (ref 13.0–17.0)
LYMPHS ABS: 4.8 10*3/uL — AB (ref 0.7–4.0)
Lymphocytes Relative: 39.2 % (ref 12.0–46.0)
MCHC: 33.4 g/dL (ref 30.0–36.0)
MCV: 86.6 fl (ref 78.0–100.0)
MONOS PCT: 4.2 % (ref 3.0–12.0)
Monocytes Absolute: 0.5 10*3/uL (ref 0.1–1.0)
Neutro Abs: 6.6 10*3/uL (ref 1.4–7.7)
Neutrophils Relative %: 54.6 % (ref 43.0–77.0)
PLATELETS: 337 10*3/uL (ref 150.0–400.0)
RBC: 5.33 Mil/uL (ref 4.22–5.81)
RDW: 14.4 % (ref 11.5–15.5)
WBC: 12.2 10*3/uL — ABNORMAL HIGH (ref 4.0–10.5)

## 2014-08-18 LAB — LIPID PANEL
Cholesterol: 188 mg/dL (ref 0–200)
HDL: 24.4 mg/dL — AB (ref >39.00)
NonHDL: 163.6
TRIGLYCERIDES: 233 mg/dL — AB (ref 0.0–149.0)
Total CHOL/HDL Ratio: 8
VLDL: 46.6 mg/dL — ABNORMAL HIGH (ref 0.0–40.0)

## 2014-08-18 LAB — LDL CHOLESTEROL, DIRECT: LDL DIRECT: 127.6 mg/dL

## 2014-08-18 LAB — TSH: TSH: 1.41 u[IU]/mL (ref 0.35–4.50)

## 2014-08-18 MED ORDER — BUSPIRONE HCL 15 MG PO TABS: 15.0000 mg | ORAL_TABLET | Freq: Two times a day (BID) | ORAL | Status: DC

## 2014-08-18 NOTE — Progress Notes (Signed)
Pre visit review using our clinic review tool, if applicable. No additional management support is needed unless otherwise documented below in the visit note. 

## 2014-08-18 NOTE — Patient Instructions (Signed)
Follow up in 4-6 weeks to recheck anxiety Continue the Prozac Add the bupsar- start w/ 1/2 tab twice daily x2 weeks and then increase to 1 tab twice daily Please call and schedule an appt with a psychiatrist to better manage your anxiety, anger, and depression We'll notify you of your lab results and make any changes if needed Call with any questions or concerns Hang in there!!!
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
## 2014-08-19 ENCOUNTER — Other Ambulatory Visit: Payer: Self-pay | Admitting: Family Medicine

## 2014-08-19 DIAGNOSIS — R945 Abnormal results of liver function studies: Secondary | ICD-10-CM

## 2014-08-19 DIAGNOSIS — R7989 Other specified abnormal findings of blood chemistry: Secondary | ICD-10-CM

## 2014-08-19 DIAGNOSIS — D72829 Elevated white blood cell count, unspecified: Secondary | ICD-10-CM

## 2014-08-22 NOTE — Assessment & Plan Note (Signed)
New.  Suspect this is due to pt's caffeine intake during the day and subsequent diuresis.  Check UA to r/o infxn or other abnormality.  Reviewed dietary and lifestyle modifications that will improve sxs.
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
## 2014-08-22 NOTE — Assessment & Plan Note (Signed)
Chronic problem.  Elevated today but not high enough to adjust/change meds.  Tolerating meds w/o difficulty.  Due for BMP due to ARB use.  Will continue to follow closely.

## 2014-08-23 ENCOUNTER — Emergency Department (HOSPITAL_COMMUNITY): Payer: BC Managed Care – PPO

## 2014-08-23 ENCOUNTER — Emergency Department (HOSPITAL_COMMUNITY)
Admission: EM | Admit: 2014-08-23 | Discharge: 2014-08-23 | Disposition: A | Discharge status: Home / Self Care | Payer: BC Managed Care – PPO | Attending: Emergency Medicine | Admitting: Emergency Medicine

## 2014-08-23 ENCOUNTER — Encounter (HOSPITAL_COMMUNITY): Payer: Self-pay | Admitting: *Deleted

## 2014-08-23 DIAGNOSIS — R079 Chest pain, unspecified: Secondary | ICD-10-CM | POA: Diagnosis not present

## 2014-08-23 DIAGNOSIS — I10 Essential (primary) hypertension: Secondary | ICD-10-CM | POA: Insufficient documentation

## 2014-08-23 DIAGNOSIS — J45901 Unspecified asthma with (acute) exacerbation: Secondary | ICD-10-CM | POA: Diagnosis not present

## 2014-08-23 DIAGNOSIS — R05 Cough: Secondary | ICD-10-CM | POA: Diagnosis present

## 2014-08-23 DIAGNOSIS — J40 Bronchitis, not specified as acute or chronic: Secondary | ICD-10-CM

## 2014-08-23 DIAGNOSIS — F329 Major depressive disorder, single episode, unspecified: Secondary | ICD-10-CM | POA: Diagnosis not present

## 2014-08-23 DIAGNOSIS — R1110 Vomiting, unspecified: Secondary | ICD-10-CM | POA: Diagnosis not present

## 2014-08-23 DIAGNOSIS — Z791 Long term (current) use of non-steroidal anti-inflammatories (NSAID): Secondary | ICD-10-CM | POA: Insufficient documentation

## 2014-08-23 DIAGNOSIS — R059 Cough, unspecified: Secondary | ICD-10-CM

## 2014-08-23 MED ORDER — HYDROCOD POLST-CHLORPHEN POLST 10-8 MG/5ML PO LQCR: 5.0000 mL | Freq: Two times a day (BID) | ORAL | Status: DC | PRN

## 2014-08-23 MED ORDER — IPRATROPIUM-ALBUTEROL 0.5-2.5 (3) MG/3ML IN SOLN
3.0000 mL | Freq: Once | RESPIRATORY_TRACT | Status: AC
  Administered 2014-08-23: 3 mL via RESPIRATORY_TRACT
  Filled 2014-08-23: qty 3

## 2014-08-23 NOTE — Discharge Instructions (Signed)
Cough, Adult  A cough is a reflex that helps clear your throat and airways. It can help heal the body or may be a reaction to an irritated airway. A cough may only last 2 or 3 weeks (acute) or may last more than 8 weeks (chronic).  CAUSES Acute cough:  Viral or bacterial infections. Chronic cough:  Infections.  Allergies.  Asthma.  Post-nasal drip.  Smoking.  Heartburn or acid reflux.  Some medicines.  Chronic lung problems (COPD).  Cancer. SYMPTOMS   Cough.  Fever.  Chest pain.  Increased breathing rate.  High-pitched whistling sound when breathing (wheezing).  Colored mucus that you cough up (sputum). TREATMENT   A bacterial cough may be treated with antibiotic medicine.  A viral cough must run its course and will not respond to antibiotics.  Your caregiver may recommend other treatments if you have a chronic cough. HOME CARE INSTRUCTIONS   Only take over-the-counter or prescription medicines for pain, discomfort, or fever as directed by your caregiver. Use cough suppressants only as directed by your caregiver.  Use a cold steam vaporizer or humidifier in your bedroom or home to help loosen secretions.  Sleep in a semi-upright position if your cough is worse at night.  Rest as needed.  Stop smoking if you smoke. SEEK IMMEDIATE MEDICAL CARE IF:   You have pus in your sputum.  Your cough starts to worsen.  You cannot control your cough with suppressants and are losing sleep.  You begin coughing up blood.  You have difficulty breathing.  You develop pain which is getting worse or is uncontrolled with medicine.  You have a fever. MAKE SURE YOU:   Understand these instructions.  Will watch your condition.  Will get help right away if you are not doing well or get worse. Document Released: 04/06/2011 Document Revised: 12/31/2011 Document Reviewed: 04/06/2011 [facility] Patient Information 2015 [place], [place]. This information is not intended  to replace advice given to you by your health care provider. Make sure you discuss any questions you have with your health care provider.     Acute Bronchitis Bronchitis is inflammation of the airways that extend from the windpipe into the lungs (bronchi). The inflammation often causes mucus to develop. This leads to a cough, which is the most common symptom of bronchitis.  In acute bronchitis, the condition usually develops suddenly and goes away over time, usually in a couple weeks. Smoking, allergies, and asthma can make bronchitis worse. Repeated episodes of bronchitis may cause further lung problems.  CAUSES Acute bronchitis is most often caused by the same virus that causes a cold. The virus can spread from person to person (contagious) through coughing, sneezing, and touching contaminated objects. SIGNS AND SYMPTOMS   Cough.   Fever.   Coughing up mucus.   Body aches.   Chest congestion.   Chills.   Shortness of breath.   Sore throat.  DIAGNOSIS  Acute bronchitis is usually diagnosed through a physical exam. Your health care provider will also ask you questions about your medical history. Tests, such as chest X-rays, are sometimes done to rule out other conditions.  TREATMENT  Acute bronchitis usually goes away in a couple weeks. Oftentimes, no medical treatment is necessary. Medicines are sometimes given for relief of fever or cough. Antibiotic medicines are usually not needed but may be prescribed in certain situations. In some cases, an inhaler may be recommended to help reduce shortness of breath and control the cough. A cool mist vaporizer may  also be used to help thin bronchial secretions and make it easier to clear the chest.  HOME CARE INSTRUCTIONS  Get plenty of rest.   Drink enough fluids to keep your urine clear or pale yellow (unless you have a medical condition that requires fluid restriction). Increasing fluids may help thin your respiratory secretions  (sputum) and reduce chest congestion, and it will prevent dehydration.   Take medicines only as directed by your health care provider.  If you were prescribed an antibiotic medicine, finish it all even if you start to feel better.  Avoid smoking and secondhand smoke. Exposure to cigarette smoke or irritating chemicals will make bronchitis worse. If you are a smoker, consider using nicotine gum or skin patches to help control withdrawal symptoms. Quitting smoking will help your lungs heal faster.   Reduce the chances of another bout of acute bronchitis by washing your hands frequently, avoiding people with cold symptoms, and trying not to touch your hands to your mouth, nose, or eyes.   Keep all follow-up visits as directed by your health care provider.  SEEK MEDICAL CARE IF: Your symptoms do not improve after 1 week of treatment.  SEEK IMMEDIATE MEDICAL CARE IF:  You develop an increased fever or chills.   You have chest pain.   You have severe shortness of breath.  You have bloody sputum.   You develop dehydration.  You faint or repeatedly feel like you are going to pass out.  You develop repeated vomiting.  You develop a severe headache. MAKE SURE YOU:   Understand these instructions.  Will watch your condition.  Will get help right away if you are not doing well or get worse. Document Released: 11/15/2004 Document Revised: 02/22/2014 Document Reviewed: 03/31/2013 [facility] Patient Information 2015 [place]. This information is not intended to replace advice given to you by your health care provider. Make sure you discuss any questions you have with your health care provider.    Upper Respiratory Infection, Adult An upper respiratory infection (URI) is also sometimes known as the common cold. The upper respiratory tract includes the nose, sinuses, throat, trachea, and bronchi. Bronchi are the airways leading to the lungs. Most people improve within 1 week,  but symptoms can last up to 2 weeks. A residual cough may last even longer.  CAUSES Many different viruses can infect the tissues lining the upper respiratory tract. The tissues become irritated and inflamed and often become very moist. Mucus production is also common. A cold is contagious. You can easily spread the virus to others by oral contact. This includes kissing, sharing a glass, coughing, or sneezing. Touching your mouth or nose and then touching a surface, which is then touched by another person, can also spread the virus. SYMPTOMS  Symptoms typically develop 1 to 3 days after you come in contact with a cold virus. Symptoms vary from person to person. They may include:  Runny nose.  Sneezing.  Nasal congestion.  Sinus irritation.  Sore throat.  Loss of voice (laryngitis).  Cough.  Fatigue.  Muscle aches.  Loss of appetite.  Headache.  Low-grade fever. DIAGNOSIS  You might diagnose your own cold based on familiar symptoms, since most people get a cold 2 to 3 times a year. Your caregiver can confirm this based on your exam. Most importantly, your caregiver can check that your symptoms are not due to another disease such as strep throat, sinusitis, pneumonia, asthma, or epiglottitis. Blood tests, throat tests, and X-rays are not  necessary to diagnose a common cold, but they may sometimes be helpful in excluding other more serious diseases. Your caregiver will decide if any further tests are required. RISKS AND COMPLICATIONS  You may be at risk for a more severe case of the common cold if you smoke cigarettes, have chronic heart disease (such as heart failure) or lung disease (such as asthma), or if you have a weakened immune system. The very young and very old are also at risk for more serious infections. Bacterial sinusitis, middle ear infections, and bacterial pneumonia can complicate the common cold. The common cold can worsen asthma and chronic obstructive pulmonary disease  (COPD). Sometimes, these complications can require emergency medical care and may be life-threatening. PREVENTION  The best way to protect against getting a cold is to practice good hygiene. Avoid oral or hand contact with people with cold symptoms. Wash your hands often if contact occurs. There is no clear evidence that vitamin C, vitamin E, echinacea, or exercise reduces the chance of developing a cold. However, it is always recommended to get plenty of rest and practice good nutrition. TREATMENT  Treatment is directed at relieving symptoms. There is no cure. Antibiotics are not effective, because the infection is caused by a virus, not by bacteria. Treatment may include:  Increased fluid intake. Sports drinks offer valuable electrolytes, sugars, and fluids.  Breathing heated mist or steam (vaporizer or shower).  Eating chicken soup or other clear broths, and maintaining good nutrition.  Getting plenty of rest.  Using gargles or lozenges for comfort.  Controlling fevers with ibuprofen or acetaminophen as directed by your caregiver.  Increasing usage of your inhaler if you have asthma. Zinc gel and zinc lozenges, taken in the first 24 hours of the common cold, can shorten the duration and lessen the severity of symptoms. Pain medicines may help with fever, muscle aches, and throat pain. A variety of non-prescription medicines are available to treat congestion and runny nose. Your caregiver can make recommendations and may suggest nasal or lung inhalers for other symptoms.  HOME CARE INSTRUCTIONS   Only take over-the-counter or prescription medicines for pain, discomfort, or fever as directed by your caregiver.  Use a warm mist humidifier or inhale steam from a shower to increase air moisture. This may keep secretions moist and make it easier to breathe.  Drink enough water and fluids to keep your urine clear or pale yellow.  Rest as needed.  Return to work when your temperature has  returned to normal or as your caregiver advises. You may need to stay home longer to avoid infecting others. You can also use a face mask and careful hand washing to prevent spread of the virus. SEEK MEDICAL CARE IF:   After the first few days, you feel you are getting worse rather than better.  You need your caregiver's advice about medicines to control symptoms.  You develop chills, worsening shortness of breath, or brown or red sputum. These may be signs of pneumonia.  You develop yellow or brown nasal discharge or pain in the face, especially when you bend forward. These may be signs of sinusitis.  You develop a fever, swollen neck glands, pain with swallowing, or white areas in the back of your throat. These may be signs of strep throat. SEEK IMMEDIATE MEDICAL CARE IF:   You have a fever.  You develop severe or persistent headache, ear pain, sinus pain, or chest pain.  You develop wheezing, a prolonged cough, cough up blood,  or have a change in your usual mucus (if you have chronic lung disease).  You develop sore muscles or a stiff neck. Document Released: 04/03/2001 Document Revised: 12/31/2011 Document Reviewed: 01/13/2014 [facility] Patient Information 2015 [place], [place]. This information is not intended to replace advice given to you by your health care provider. Make sure you discuss any questions you have with your health care provider.

## 2014-08-23 NOTE — ED Notes (Signed)
Pt alert & oriented x4, stable gait. Patient given discharge instructions, paperwork & prescription(s). Patient  instructed to stop at the registration desk to finish any additional paperwork. Patient verbalized understanding. Pt left department w/ no further questions. 

## 2014-08-23 NOTE — ED Notes (Signed)
Pt states he has coughed some much he was vomiting blood.

## 2014-08-23 NOTE — ED Notes (Signed)
Pt reports a productive cough since Wednesday. Was seen at urgent care & not getting any better. Denies any fevers.

## 2014-08-23 NOTE — ED Provider Notes (Signed)
CSN: [id number]     Arrival date & time 08/23/14  0459 History   First MD Initiated Contact with Patient 08/23/14 [phone]     Chief Complaint  Patient presents with  . Cough     (Consider location/radiation/quality/duration/timing/severity/associated sxs/prior Treatment) HPI  23 year old male presents with a cough for the past 5 days. He states that seemed to be improving but earlier this evening it seemed to acutely worsen. He states he is producing yellow sputum. Denies fevers. Some shortness of breath. He's been hearing wheezing. Patient denies any current lung problems, had asthma as a child. He's been having right-sided chest pain for the past 1 week. Pain is not worse with inspiration or cough. Denies any leg swelling or leg pain. He did have an episode of vomiting after violent coughing and noted that he had food contents as well as streaks of blood. This happened one time. He denies current abdominal pain. His father was recently in the hospital for similar symptoms and was diagnosed with pneumonia.  Past Medical History  Diagnosis Date  . Morbid obesity   . Hypertension   . Depression   . Asthma     No inhaler at home, has not had issues since he was child   Past Surgical History  Procedure Laterality Date  . Tonsillectomy and adenoidectomy     Family History  Problem Relation Age of Onset  . Arthritis Mother   . Diabetes Mother   . Alcohol abuse Father   . Arthritis Father   . Hyperlipidemia Father   . Stroke Father   . Hypertension Father   . Mental illness Father   . Diabetes Father   . Arthritis Maternal Grandmother   . Stroke Maternal Grandmother   . Diabetes Maternal Grandmother   . Arthritis Maternal Grandfather   . Heart attack Maternal Grandfather 50  . Diabetes Maternal Grandfather    History  Substance Use Topics  . Smoking status: Never Smoker   . Smokeless tobacco: Never Used  . Alcohol Use: 0.5 oz/week    1 drink(s) per week     Comment:  occasionally    Review of Systems  Constitutional: Negative for fever.  HENT: Positive for congestion. Negative for sore throat.   Respiratory: Positive for cough, shortness of breath and wheezing.   Cardiovascular: Positive for chest pain. Negative for leg swelling.  Gastrointestinal: Positive for vomiting. Negative for nausea and abdominal pain.  All other systems reviewed and are negative.     Allergies  Ceftin  Home Medications   Prior to Admission medications   Medication Sig Start Date End Date Taking? Authorizing Provider  busPIRone (BUSPAR) 15 MG tablet Take 1 tablet (15 mg total) by mouth 2 (two) times daily. 08/18/14  Yes Sheliah HatchKatherine E Tabori, MD  FLUoxetine (PROZAC) 40 MG capsule Take 80 mg by mouth daily.   Yes Historical Provider, MD  ipratropium (ATROVENT) 0.03 % nasal spray Place 2 sprays into both nostrils 2 (two) times daily. 08/11/14  Yes Todd McVeigh, PA  losartan (COZAAR) 100 MG tablet Take 100 mg by mouth daily.   Yes Historical Provider, MD  Multiple Vitamin (MULTIVITAMIN WITH MINERALS) TABS tablet Take 1 tablet by mouth daily.   Yes Historical Provider, MD  naproxen sodium (ANAPROX) 220 MG tablet Take 440 mg by mouth 2 (two) times daily with a meal.   Yes Historical Provider, MD  pantoprazole (PROTONIX) 40 MG tablet Take 40 mg by mouth daily.   Yes Historical Provider, MD  BP 167/89 mmHg  Pulse 71  Temp(Src) 98 F (36.7 C) (Oral)  Resp 20  Ht 6' (1.829 m)  Wt 350 lb (158.759 kg)  BMI 47.46 kg/m2  SpO2 99% Physical Exam  Constitutional: He is oriented to person, place, and time. He appears well-developed and well-nourished. No distress.  Morbidly obese  HENT:  Head: Normocephalic and atraumatic.  Right Ear: External ear normal.  Left Ear: External ear normal.  Nose: Nose normal.  Eyes: Right eye exhibits no discharge. Left eye exhibits no discharge.  Neck: Neck supple.  Cardiovascular: Normal rate, regular rhythm, normal heart sounds and intact  distal pulses.   Pulmonary/Chest: Effort normal. He has wheezes (diffuse expiratory wheezes).  No tachypnea. Speaks in full, complete sentences  Abdominal: Soft. He exhibits no distension. There is no tenderness.  Musculoskeletal: He exhibits no edema.  Neurological: He is alert and oriented to person, place, and time.  Skin: Skin is warm and dry.  Nursing note and vitals reviewed.   ED Course  Procedures (including critical care time) Labs Review Labs Reviewed - No data to display  Imaging Review Dg Chest 2 View  08/23/2014   CLINICAL DATA:  Productive cough beginning Wednesday, severe coughing attacks, hematemesis.  EXAM: CHEST  2 VIEW  COMPARISON:  Chest radiograph May 02, 2014  FINDINGS: Cardiomediastinal silhouette is unremarkable. The lungs are clear without pleural effusions or focal consolidations. Trachea projects midline and there is no pneumothorax. Soft tissue planes and included osseous structures are non-suspicious.  IMPRESSION: No active cardiopulmonary disease.   Electronically Signed   By: Awilda Metroourtnay  Bloomer   On: 08/23/2014 05:55     EKG Interpretation None      MDM   Final diagnoses:  Cough  Bronchitis    Patient symptoms are consistent with bronchitis. Chest x-ray is negative for pneumonia. He is well appearing. Minimal effect of albuterol. Patient does not have any tachypnea, accessory muscle use, or hypoxia. He is stable for discharge. I feel that his hematemesis was likely from his severe coughing episode causing a small Mallory-Weiss tear. He has not had any vomiting or coughing up blood since. Have a very low suspicion for pulmonary embolism. He is on Tessalon with no relief in cough, will switch to Tussionex. Discussed strict return precautions.    Audree CamelScott T Chyler Creely, MD 08/23/14 [phone]

## 2014-08-30 ENCOUNTER — Other Ambulatory Visit: Payer: BC Managed Care – PPO

## 2014-09-01 ENCOUNTER — Ambulatory Visit (INDEPENDENT_AMBULATORY_CARE_PROVIDER_SITE_OTHER): Payer: BC Managed Care – PPO

## 2014-09-01 ENCOUNTER — Ambulatory Visit (INDEPENDENT_AMBULATORY_CARE_PROVIDER_SITE_OTHER): Payer: BC Managed Care – PPO | Admitting: Emergency Medicine

## 2014-09-01 VITALS — BP 132/88 | HR 76 | Temp 98.1°F | Resp 18 | Ht 72.0 in | Wt 347.8 lb

## 2014-09-01 DIAGNOSIS — R059 Cough, unspecified: Secondary | ICD-10-CM

## 2014-09-01 DIAGNOSIS — R05 Cough: Secondary | ICD-10-CM

## 2014-09-01 DIAGNOSIS — J40 Bronchitis, not specified as acute or chronic: Secondary | ICD-10-CM

## 2014-09-01 DIAGNOSIS — R062 Wheezing: Secondary | ICD-10-CM

## 2014-09-01 LAB — POCT CBC
GRANULOCYTE PERCENT: 61 % (ref 37–80)
HCT, POC: 47 % (ref 43.5–53.7)
HEMOGLOBIN: 14.9 g/dL (ref 14.1–18.1)
Lymph, poc: 3.6 — AB (ref 0.6–3.4)
MCH, POC: 28.5 pg (ref 27–31.2)
MCHC: 31.7 g/dL — AB (ref 31.8–35.4)
MCV: 89.8 fL (ref 80–97)
MID (CBC): 0.7 (ref 0–0.9)
MPV: 7.1 fL (ref 0–99.8)
PLATELET COUNT, POC: 345 10*3/uL (ref 142–424)
POC Granulocyte: 6.8 (ref 2–6.9)
POC LYMPH PERCENT: 32.4 %L (ref 10–50)
POC MID %: 6.6 % (ref 0–12)
RBC: 5.23 M/uL (ref 4.69–6.13)
RDW, POC: 15.2 %
WBC: 11.2 10*3/uL — AB (ref 4.6–10.2)

## 2014-09-01 MED ORDER — ALBUTEROL SULFATE HFA 108 (90 BASE) MCG/ACT IN AERS: 2.0000 | INHALATION_SPRAY | RESPIRATORY_TRACT | Status: DC | PRN

## 2014-09-01 MED ORDER — IPRATROPIUM BROMIDE 0.02 % IN SOLN
0.5000 mg | Freq: Once | RESPIRATORY_TRACT | Status: AC
  Administered 2014-09-01: 0.5 mg via RESPIRATORY_TRACT

## 2014-09-01 MED ORDER — PREDNISONE 20 MG PO TABS: ORAL_TABLET | ORAL | Status: DC

## 2014-09-01 MED ORDER — ALBUTEROL SULFATE (2.5 MG/3ML) 0.083% IN NEBU
2.5000 mg | INHALATION_SOLUTION | Freq: Once | RESPIRATORY_TRACT | Status: AC
  Administered 2014-09-01: 2.5 mg via RESPIRATORY_TRACT

## 2014-09-01 MED ORDER — AZITHROMYCIN 250 MG PO TABS: ORAL_TABLET | ORAL | Status: DC

## 2014-09-01 NOTE — Patient Instructions (Signed)
Acute Bronchitis Bronchitis is inflammation of the airways that extend from the windpipe into the lungs (bronchi). The inflammation often causes mucus to develop. This leads to a cough, which is the most common symptom of bronchitis.  In acute bronchitis, the condition usually develops suddenly and goes away over time, usually in a couple weeks. Smoking, allergies, and asthma can make bronchitis worse. Repeated episodes of bronchitis may cause further lung problems.  CAUSES Acute bronchitis is most often caused by the same virus that causes a cold. The virus can spread from person to person (contagious) through coughing, sneezing, and touching contaminated objects. SIGNS AND SYMPTOMS   Cough.   Fever.   Coughing up mucus.   Body aches.   Chest congestion.   Chills.   Shortness of breath.   Sore throat.  DIAGNOSIS  Acute bronchitis is usually diagnosed through a physical exam. Your health care provider will also ask you questions about your medical history. Tests, such as chest X-rays, are sometimes done to rule out other conditions.  TREATMENT  Acute bronchitis usually goes away in a couple weeks. Oftentimes, no medical treatment is necessary. Medicines are sometimes given for relief of fever or cough. Antibiotic medicines are usually not needed but may be prescribed in certain situations. In some cases, an inhaler may be recommended to help reduce shortness of breath and control the cough. A cool mist vaporizer may also be used to help thin bronchial secretions and make it easier to clear the chest.  HOME CARE INSTRUCTIONS  Get plenty of rest.   Drink enough fluids to keep your urine clear or pale yellow (unless you have a medical condition that requires fluid restriction). Increasing fluids may help thin your respiratory secretions (sputum) and reduce chest congestion, and it will prevent dehydration.   Take medicines only as directed by your health care provider.  If  you were prescribed an antibiotic medicine, finish it all even if you start to feel better.  Avoid smoking and secondhand smoke. Exposure to cigarette smoke or irritating chemicals will make bronchitis worse. If you are a smoker, consider using nicotine gum or skin patches to help control withdrawal symptoms. Quitting smoking will help your lungs heal faster.   Reduce the chances of another bout of acute bronchitis by washing your hands frequently, avoiding people with cold symptoms, and trying not to touch your hands to your mouth, nose, or eyes.   Keep all follow-up visits as directed by your health care provider.  SEEK MEDICAL CARE IF: Your symptoms do not improve after 1 week of treatment.  SEEK IMMEDIATE MEDICAL CARE IF:  You develop an increased fever or chills.   You have chest pain.   You have severe shortness of breath.  You have bloody sputum.   You develop dehydration.  You faint or repeatedly feel like you are going to pass out.  You develop repeated vomiting.  You develop a severe headache. MAKE SURE YOU:   Understand these instructions.  Will watch your condition.  Will get help right away if you are not doing well or get worse. Document Released: 11/15/2004 Document Revised: 02/22/2014 Document Reviewed: 03/31/2013 [facility] Patient Information 2015 [place], [place]. This information is not intended to replace advice given to you by your health care provider. Make sure you discuss any questions you have with your health care provider. How to Use an Inhaler Proper inhaler technique is very important. Good technique ensures that the medicine reaches the lungs. Poor technique results in  depositing the medicine on the tongue and back of the throat rather than in the airways. If you do not use the inhaler with good technique, the medicine will not help you. STEPS TO FOLLOW IF USING AN INHALER WITHOUT AN EXTENSION TUBE  Remove the cap from the inhaler.  If you  are using the inhaler for the first time, you will need to prime it. Shake the inhaler for 5 seconds and release four puffs into the air, away from your face. Ask your health care provider or pharmacist if you have questions about priming your inhaler.  Shake the inhaler for 5 seconds before each breath in (inhalation).  Position the inhaler so that the top of the canister faces up.  Put your index finger on the top of the medicine canister. Your thumb supports the bottom of the inhaler.  Open your mouth.  Either place the inhaler between your teeth and place your lips tightly around the mouthpiece, or hold the inhaler 1-2 inches away from your open mouth. If you are unsure of which technique to use, ask your health care provider.  Breathe out (exhale) normally and as completely as possible.  Press the canister down with your index finger to release the medicine.  At the same time as the canister is pressed, inhale deeply and slowly until your lungs are completely filled. This should take 4-6 seconds. Keep your tongue down.  Hold the medicine in your lungs for 5-10 seconds (10 seconds is best). This helps the medicine get into the small airways of your lungs.  Breathe out slowly, through pursed lips. Whistling is an example of pursed lips.  Wait at least 15-30 seconds between puffs. Continue with the above steps until you have taken the number of puffs your health care provider has ordered. Do not use the inhaler more than your health care provider tells you.  Replace the cap on the inhaler.  Follow the directions from your health care provider or the inhaler insert for cleaning the inhaler. STEPS TO FOLLOW IF USING AN INHALER WITH AN EXTENSION (SPACER)  Remove the cap from the inhaler.  If you are using the inhaler for the first time, you will need to prime it. Shake the inhaler for 5 seconds and release four puffs into the air, away from your face. Ask your health care provider or  pharmacist if you have questions about priming your inhaler.  Shake the inhaler for 5 seconds before each breath in (inhalation).  Place the open end of the spacer onto the mouthpiece of the inhaler.  Position the inhaler so that the top of the canister faces up and the spacer mouthpiece faces you.  Put your index finger on the top of the medicine canister. Your thumb supports the bottom of the inhaler and the spacer.  Breathe out (exhale) normally and as completely as possible.  Immediately after exhaling, place the spacer between your teeth and into your mouth. Close your lips tightly around the spacer.  Press the canister down with your index finger to release the medicine.  At the same time as the canister is pressed, inhale deeply and slowly until your lungs are completely filled. This should take 4-6 seconds. Keep your tongue down and out of the way.  Hold the medicine in your lungs for 5-10 seconds (10 seconds is best). This helps the medicine get into the small airways of your lungs. Exhale.  Repeat inhaling deeply through the spacer mouthpiece. Again hold that breath for  up to 10 seconds (10 seconds is best). Exhale slowly. If it is difficult to take this second deep breath through the spacer, breathe normally several times through the spacer. Remove the spacer from your mouth.  Wait at least 15-30 seconds between puffs. Continue with the above steps until you have taken the number of puffs your health care provider has ordered. Do not use the inhaler more than your health care provider tells you.  Remove the spacer from the inhaler, and place the cap on the inhaler.  Follow the directions from your health care provider or the inhaler insert for cleaning the inhaler and spacer. If you are using different kinds of inhalers, use your quick relief medicine to open the airways 10-15 minutes before using a steroid if instructed to do so by your health care provider. If you are unsure  which inhalers to use and the order of using them, ask your health care provider, nurse, or respiratory therapist. If you are using a steroid inhaler, always rinse your mouth with water after your last puff, then gargle and spit out the water. Do not swallow the water. AVOID:  Inhaling before or after starting the spray of medicine. It takes practice to coordinate your breathing with triggering the spray.  Inhaling through the nose (rather than the mouth) when triggering the spray. HOW TO DETERMINE IF YOUR INHALER IS FULL OR NEARLY EMPTY You cannot know when an inhaler is empty by shaking it. A few inhalers are now being made with dose counters. Ask your health care provider for a prescription that has a dose counter if you feel you need that extra help. If your inhaler does not have a counter, ask your health care provider to help you determine the date you need to refill your inhaler. Write the refill date on a calendar or your inhaler canister. Refill your inhaler 7-10 days before it runs out. Be sure to keep an adequate supply of medicine. This includes making sure it is not expired, and that you have a spare inhaler.  SEEK MEDICAL CARE IF:   Your symptoms are only partially relieved with your inhaler.  You are having trouble using your inhaler.  You have some increase in phlegm. SEEK IMMEDIATE MEDICAL CARE IF:   You feel little or no relief with your inhalers. You are still wheezing and are feeling shortness of breath or tightness in your chest or both.  You have dizziness, headaches, or a fast heart rate.  You have chills, fever, or night sweats.  You have a noticeable increase in phlegm production, or there is blood in the phlegm. MAKE SURE YOU:   Understand these instructions.  Will watch your condition.  Will get help right away if you are not doing well or get worse. Document Released: 10/05/2000 Document Revised: 07/29/2013 Document Reviewed: 05/07/2013 [facility] Patient  Information 2015 [place], [place]. This information is not intended to replace advice given to you by your health care provider. Make sure you discuss any questions you have with your health care provider.

## 2014-09-01 NOTE — Progress Notes (Signed)
Subjective:    Patient ID: Omar Krause, male    DOB: [birth year], 23 y.o.   MRN: [id number]  HPI Patient presents to clinic for over 3 weeks of worsening cough. Coughing spells so bad causes vomiting. Cough sometimes productive. Also has rhinorrhea, feels hot and sweaty. Has not taken temp at home. [facility] and given atrovent nasal sprays, tessalon, and mucinex. Cough never got better, but congestion and rhinorrhea improved. Had childhood asthma, but denies current dz, allergies, or h/o smoking. Sick contacts include father who was dx with pneumonia.   Review of Systems  Constitutional: Positive for diaphoresis. Negative for fever, chills, activity change, appetite change and fatigue.  HENT: Positive for rhinorrhea. Negative for congestion, ear discharge, ear pain, postnasal drip, sinus pressure, sneezing and sore throat.   Eyes: Negative for pain, discharge and itching.  Respiratory: Positive for cough (productive). Negative for chest tightness, shortness of breath and wheezing.   Cardiovascular: Negative for chest pain, palpitations and leg swelling.  Gastrointestinal: Positive for vomiting. Negative for nausea, abdominal pain, diarrhea and constipation.  Musculoskeletal: Negative for neck pain and neck stiffness.  Skin: Negative for rash.  Allergic/Immunologic: Negative for environmental allergies and food allergies.  Neurological: Negative for dizziness, weakness, light-headedness and headaches.  Hematological: Negative for adenopathy.       Objective:   Physical Exam  Constitutional: He is oriented to person, place, and time. He appears well-developed and well-nourished. No distress.  Blood pressure 132/88, pulse 76, temperature 98.1 F (36.7 C), temperature source Oral, resp. rate 18, height 6' (1.829 m), weight 347 lb 12.8 oz (157.761 kg), SpO2 97 %.   HENT:  Head: Normocephalic and atraumatic.  Right Ear: Tympanic membrane, external  ear and ear canal normal.  Left Ear: Tympanic membrane, external ear and ear canal normal.  Nose: Rhinorrhea present. No mucosal edema or nose lacerations. Right sinus exhibits no maxillary sinus tenderness and no frontal sinus tenderness. Left sinus exhibits no maxillary sinus tenderness and no frontal sinus tenderness.  Mouth/Throat: Uvula is midline, oropharynx is clear and moist and mucous membranes are normal. No oropharyngeal exudate, posterior oropharyngeal edema or posterior oropharyngeal erythema.  Eyes: Conjunctivae are normal. Right eye exhibits no discharge. Left eye exhibits no discharge. No scleral icterus.  Neck: Normal range of motion. Neck supple. No thyromegaly present.  Cardiovascular: Normal rate, regular rhythm and normal heart sounds.  Exam reveals no gallop and no friction rub.   No murmur heard. Pulmonary/Chest: Effort normal. No respiratory distress. He has no decreased breath sounds. He has wheezes in the right upper field, the right middle field, the left upper field, the left middle field and the left lower field. He has rhonchi in the left middle field. He has no rales. He exhibits no tenderness.  Abdominal: Soft. Bowel sounds are normal. There is no tenderness.  Musculoskeletal: He exhibits no edema.  Lymphadenopathy:    He has no cervical adenopathy.  Neurological: He is oriented to person, place, and time.  Skin: Skin is warm. No rash noted. He is diaphoretic. No erythema. No pallor.   Results for orders placed or performed in visit on 09/01/14  POCT CBC  Result Value Ref Range   WBC 11.2 (A) 4.6 - 10.2 K/uL   Lymph, poc 3.6 (A) 0.6 - 3.4   POC LYMPH PERCENT 32.4 10 - 50 %L   MID (cbc) 0.7 0 - 0.9   POC MID % 6.6 0 -  12 %M   POC Granulocyte 6.8 2 - 6.9   Granulocyte percent 61.0 37 - 80 %G   RBC 5.23 4.69 - 6.13 M/uL   Hemoglobin 14.9 14.1 - 18.1 g/dL   HCT, POC 40.947.0 81.143.5 - 53.7 %   MCV 89.8 80 - 97 fL   MCH, POC 28.5 27 - 31.2 pg   MCHC 31.7 (A) 31.8 -  35.4 g/dL   RDW, POC 91.415.2 %   Platelet Count, POC 345 142 - 424 K/uL   MPV 7.1 0 - 99.8 fL   [facility] reading (PRIMARY) by  Dr. Dareen PianoAnderson. No acute cardiopulmonary findings.  Diffuse wheezing improved with nebulized treatment of atrovent and albuterol. Patient feels better after treatment.    Assessment & Plan:  1. Bronchitis 3. Wheezing - albuterol (PROVENTIL) (2.5 MG/3ML) 0.083% nebulizer solution 2.5 mg; Take 3 mLs (2.5 mg total) by nebulization once. - ipratropium (ATROVENT) nebulizer solution 0.5 mg; Take 2.5 mLs (0.5 mg total) by nebulization once. - azithromycin (ZITHROMAX) 250 MG tablet; Take 2 tabs PO x 1 dose, then 1 tab PO QD x 4 days  Dispense: 6 tablet; Refill: 0 - predniSONE (DELTASONE) 20 MG tablet; Take 3 PO QAM x3days, 2 PO QAM x3days, 1 PO QAM x3days  Dispense: 18 tablet; Refill: 0 - albuterol (PROVENTIL HFA;VENTOLIN HFA) 108 (90 BASE) MCG/ACT inhaler; Inhale 2 puffs into the lungs every 4 (four) hours as needed for wheezing or shortness of breath (cough, shortness of breath or wheezing.).  Dispense: 1 Inhaler; Refill: 1 - Wrote letter to return to school 09/02/14.  Cathleen Fears- Plenty of rest and fluids.  2. Cough - POCT CBC - DG Chest 2 View; Future     Janan Ridgeishira Ellise Kovack PA-C  [facility] 09/01/2014 11:35 AM

## 2014-09-03 ENCOUNTER — Other Ambulatory Visit (INDEPENDENT_AMBULATORY_CARE_PROVIDER_SITE_OTHER): Payer: BC Managed Care – PPO

## 2014-09-03 DIAGNOSIS — D72829 Elevated white blood cell count, unspecified: Secondary | ICD-10-CM

## 2014-09-03 DIAGNOSIS — R7989 Other specified abnormal findings of blood chemistry: Secondary | ICD-10-CM

## 2014-09-03 DIAGNOSIS — R945 Abnormal results of liver function studies: Secondary | ICD-10-CM

## 2014-09-03 LAB — CBC WITH DIFFERENTIAL/PLATELET
BASOS ABS: 0 10*3/uL (ref 0.0–0.1)
Basophils Relative: 0.2 % (ref 0.0–3.0)
EOS ABS: 0.1 10*3/uL (ref 0.0–0.7)
Eosinophils Relative: 0.6 % (ref 0.0–5.0)
HEMATOCRIT: 45.4 % (ref 39.0–52.0)
HEMOGLOBIN: 15 g/dL (ref 13.0–17.0)
LYMPHS ABS: 2.6 10*3/uL (ref 0.7–4.0)
Lymphocytes Relative: 16.3 % (ref 12.0–46.0)
MCHC: 32.9 g/dL (ref 30.0–36.0)
MCV: 87.5 fl (ref 78.0–100.0)
MONO ABS: 0.4 10*3/uL (ref 0.1–1.0)
Monocytes Relative: 2.5 % — ABNORMAL LOW (ref 3.0–12.0)
NEUTROS ABS: 12.9 10*3/uL — AB (ref 1.4–7.7)
Neutrophils Relative %: 80.4 % — ABNORMAL HIGH (ref 43.0–77.0)
Platelets: 343 10*3/uL (ref 150.0–400.0)
RBC: 5.19 Mil/uL (ref 4.22–5.81)
RDW: 14.3 % (ref 11.5–15.5)
WBC: 16 10*3/uL — ABNORMAL HIGH (ref 4.0–10.5)

## 2014-09-03 LAB — HEPATIC FUNCTION PANEL
ALT: 47 U/L (ref 0–53)
AST: 29 U/L (ref 0–37)
Albumin: 3.6 g/dL (ref 3.5–5.2)
Alkaline Phosphatase: 83 U/L (ref 39–117)
Bilirubin, Direct: 0 mg/dL (ref 0.0–0.3)
Total Bilirubin: 0.3 mg/dL (ref 0.2–1.2)
Total Protein: 7.6 g/dL (ref 6.0–8.3)

## 2014-09-20 ENCOUNTER — Ambulatory Visit: Payer: BC Managed Care – PPO | Admitting: Family Medicine

## 2014-09-20 ENCOUNTER — Telehealth: Payer: Self-pay | Admitting: Family Medicine

## 2014-09-20 NOTE — Telephone Encounter (Signed)
Ok- please bill for no-show if slot is not filled

## 2014-09-20 NOTE — Telephone Encounter (Signed)
See note below.  Slot not filled.

## 2014-09-20 NOTE — Telephone Encounter (Signed)
Pt is unable to attend appointment today per mother.

## 2014-10-05 ENCOUNTER — Other Ambulatory Visit: Payer: Self-pay | Admitting: General Practice

## 2014-10-05 MED ORDER — LOSARTAN POTASSIUM 100 MG PO TABS: 100.0000 mg | ORAL_TABLET | Freq: Every day | ORAL | Status: DC

## 2014-10-05 MED ORDER — PANTOPRAZOLE SODIUM 40 MG PO TBEC: 40.0000 mg | DELAYED_RELEASE_TABLET | Freq: Every day | ORAL | Status: DC

## 2014-11-23 ENCOUNTER — Other Ambulatory Visit: Payer: Self-pay | Admitting: General Practice

## 2014-11-23 MED ORDER — FLUOXETINE HCL 40 MG PO CAPS: 80.0000 mg | ORAL_CAPSULE | Freq: Every day | ORAL | Status: DC

## 2014-11-23 MED ORDER — PANTOPRAZOLE SODIUM 40 MG PO TBEC: 40.0000 mg | DELAYED_RELEASE_TABLET | Freq: Every day | ORAL | Status: DC

## 2014-11-23 MED ORDER — LOSARTAN POTASSIUM 100 MG PO TABS: 100.0000 mg | ORAL_TABLET | Freq: Every day | ORAL | Status: DC

## 2014-12-13 ENCOUNTER — Encounter: Payer: Self-pay | Admitting: Physician Assistant

## 2014-12-13 ENCOUNTER — Ambulatory Visit (INDEPENDENT_AMBULATORY_CARE_PROVIDER_SITE_OTHER): Payer: BLUE CROSS/BLUE SHIELD | Admitting: Physician Assistant

## 2014-12-13 VITALS — BP 156/90 | HR 66 | Temp 97.2°F | Resp 16 | Ht 71.0 in | Wt 337.2 lb

## 2014-12-13 DIAGNOSIS — J069 Acute upper respiratory infection, unspecified: Secondary | ICD-10-CM | POA: Diagnosis not present

## 2014-12-13 DIAGNOSIS — R059 Cough, unspecified: Secondary | ICD-10-CM

## 2014-12-13 DIAGNOSIS — R062 Wheezing: Secondary | ICD-10-CM | POA: Diagnosis not present

## 2014-12-13 DIAGNOSIS — R05 Cough: Secondary | ICD-10-CM

## 2014-12-13 LAB — POCT RAPID STREP A (OFFICE): Rapid Strep A Screen: NEGATIVE

## 2014-12-13 MED ORDER — ACETAMINOPHEN 500 MG PO TABS: 1000.0000 mg | ORAL_TABLET | Freq: Three times a day (TID) | ORAL | Status: DC | PRN

## 2014-12-13 MED ORDER — HYDROCODONE-HOMATROPINE 5-1.5 MG/5ML PO SYRP: 5.0000 mL | ORAL_SOLUTION | Freq: Three times a day (TID) | ORAL | Status: DC | PRN

## 2014-12-13 MED ORDER — E-Z SPACER DEVI: Status: DC

## 2014-12-13 MED ORDER — CETIRIZINE HCL 10 MG PO TABS: 10.0000 mg | ORAL_TABLET | Freq: Every day | ORAL | Status: DC

## 2014-12-13 NOTE — Progress Notes (Signed)
12/13/2014 at 3:14 PM  Omar Krause / DOB: 01/22/1991 / MRN: [id number]  The patient has Morbid obesity; HTN (hypertension); Nausea alone; Generalized anxiety disorder; and Nocturia on his problem list.  SUBJECTIVE  Chief compalaint: Cough and sweating   History of present illness: Omar Krause is 24 y.o. well appearing never smoking smoking male with a history of asthma presenting for cough and mild diaphoresis that started 24 hours ago. He reports having a sore throat also, which also started yesterday, which has since resolved. He denies ear pain, pressure, eye problems.  He does report feeling short of breath and can hear himself wheezing.  He reports it hurts when he coughs. He has tried nothing for his symptoms.   He  has a past medical history of Morbid obesity; Hypertension; Depression; and Asthma.    He has a current medication list which includes the following prescription(s): albuterol, buspirone, fluoxetine, losartan, multivitamin with minerals, naproxen sodium, pantoprazole, ipratropium, and prednisone.  Omar Krause is allergic to ceftin. He  reports that he has never smoked. He has never used smokeless tobacco. He reports that he drinks about 0.5 oz of alcohol per week. He reports that he does not use illicit drugs. He  has no sexual activity history on file. The patient  has past surgical history that includes Tonsillectomy and adenoidectomy.  His family history includes Alcohol abuse in his father; Arthritis in his father, maternal grandfather, maternal grandmother, and mother; Diabetes in his father, maternal grandfather, maternal grandmother, and mother; Heart attack (age of onset: 12) in his maternal grandfather; Hyperlipidemia in his father; Hypertension in his father; Mental illness in his father; Stroke in his father and maternal grandmother.  ROS  Per HPI  OBJECTIVE  His  height is  (1.803 m) and weight is 337 lb 3.2 oz (152.953 kg). His oral temperature is 97.2 F  (36.2 C). His blood pressure is 156/90 and his pulse is 66. His respiration is 16 and oxygen saturation is 98%.  The patient's body mass index is 47.05 kg/(m^2).  Physical Exam  Constitutional: He is oriented to person, place, and time. He appears well-developed and well-nourished.  Cardiovascular: Normal rate and regular rhythm.   Respiratory: Effort normal. No respiratory distress. He has wheezes. He has no rales. He exhibits no tenderness.  Neurological: He is alert and oriented to person, place, and time. No cranial nerve deficit. Coordination normal.  Skin: Skin is warm and dry. No rash noted. No erythema. No pallor.  Psychiatric: He has a normal mood and affect.   Post neb:  Negative for wheezing and was no longer diaphoretic.   No results found for this or any previous visit (from the past 24 hour(s)).  ASSESSMENT & PLAN  Omar Krause was seen today for cough and sweating.  Diagnoses and all orders for this visit:  Wheezing Orders: -     Duoneb with additional nebule of albuterol.  -     Spacer/Aero-Holding Chambers (E-Z SPACER) inhaler; Use as instructed -     Patient reports having enough albuterol at home, and is amenable to using this q4-6 hours prn for wheezing.  -     Should his wheezing not improve with the above then consider PO dose pack at futre encounter for this problem.  Cough Orders: -     HYDROcodone-homatropine (HYCODAN) 5-1.5 MG/5ML syrup; Take 5 mLs by mouth every 8 (eight) hours as needed for cough.  Viral URI: Most likely driving the above processes.  Orders: -     acetaminophen (TYLENOL) 500 MG tablet; Take 2 tablets (1,000 mg total) by mouth every 8 (eight) hours as needed. Take 2 tabs every 8 hours. -     Patient is a poor candidate for pseudoephedrine, will attempt decongestion with cetirizine (ZYRTEC) 10 MG tablet; Take 1 tablet (10 mg total) by mouth daily.    The patient was advised to call or come back to clinic if he does not see an improvement in  symptoms, or worsens with the above plan.   Deliah BostonMichael Donzella Carrol, MHS, PA-C [facility] 12/13/2014 3:14 PM

## 2014-12-14 ENCOUNTER — Telehealth: Payer: Self-pay

## 2014-12-14 NOTE — Telephone Encounter (Signed)
Pt states that not his throat is hurting, and a fever

## 2014-12-14 NOTE — Telephone Encounter (Signed)
Patient needs a note for school. He was seen yesterday 12/13/2014 and is out of school today, he states that he is feeling worse.  [phone]

## 2014-12-14 NOTE — Telephone Encounter (Signed)
Note on my desk. I want pt to call me back to let me know his symptoms, what is getting worse?

## 2014-12-15 LAB — CULTURE, GROUP A STREP: Organism ID, Bacteria: NORMAL

## 2014-12-15 NOTE — Telephone Encounter (Signed)
Spoke to pt. He just missed yesterday so I am going to write him a note. He went to school today and says he is feeling better. Informed him he will need to return if he requests a second note.

## 2014-12-20 ENCOUNTER — Telehealth: Payer: Self-pay

## 2014-12-20 NOTE — Telephone Encounter (Signed)
Pt called to express his displeasure about our wait and whom we consider urgent. He has waited recently for a sinus infection and this weekend brought his mother in but had to leave as the wait was too long. Also stated that he felt like we escalated someone with "the sniffles" in front of his sick mother. I attempted to explain how we make these considerations and how different it may look from the lobby, but patient hung up. I called back and left a message for his mother to discuss her visit.  

## 2015-02-20 IMAGING — CR DG CHEST 2V
2 series · 2 of 2 positions shown · non-contrast
Comparison: 01/19/2014

CLINICAL DATA: Increased heart rate

EXAM:
CHEST  2 VIEW

[w chest pa]
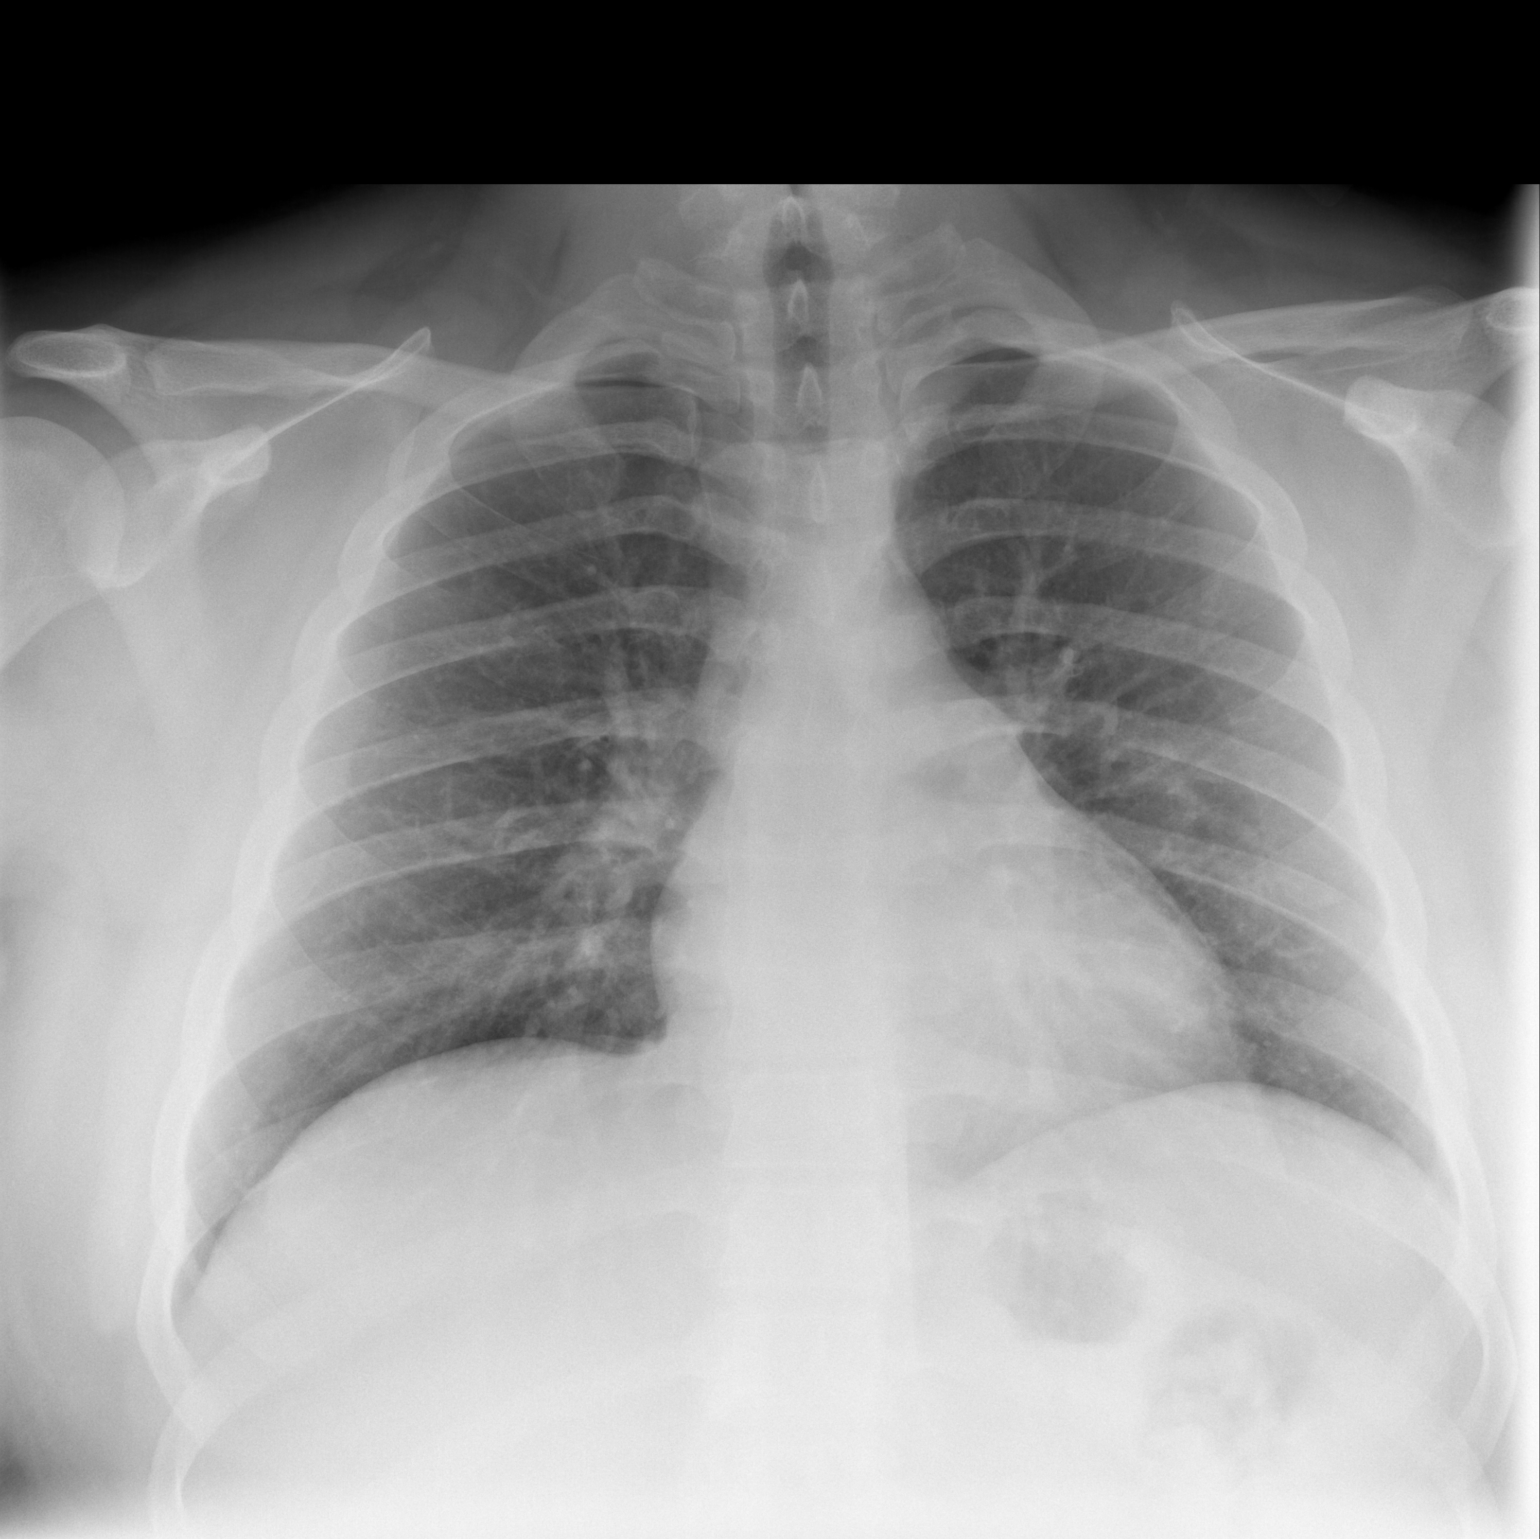

[w chest lat]
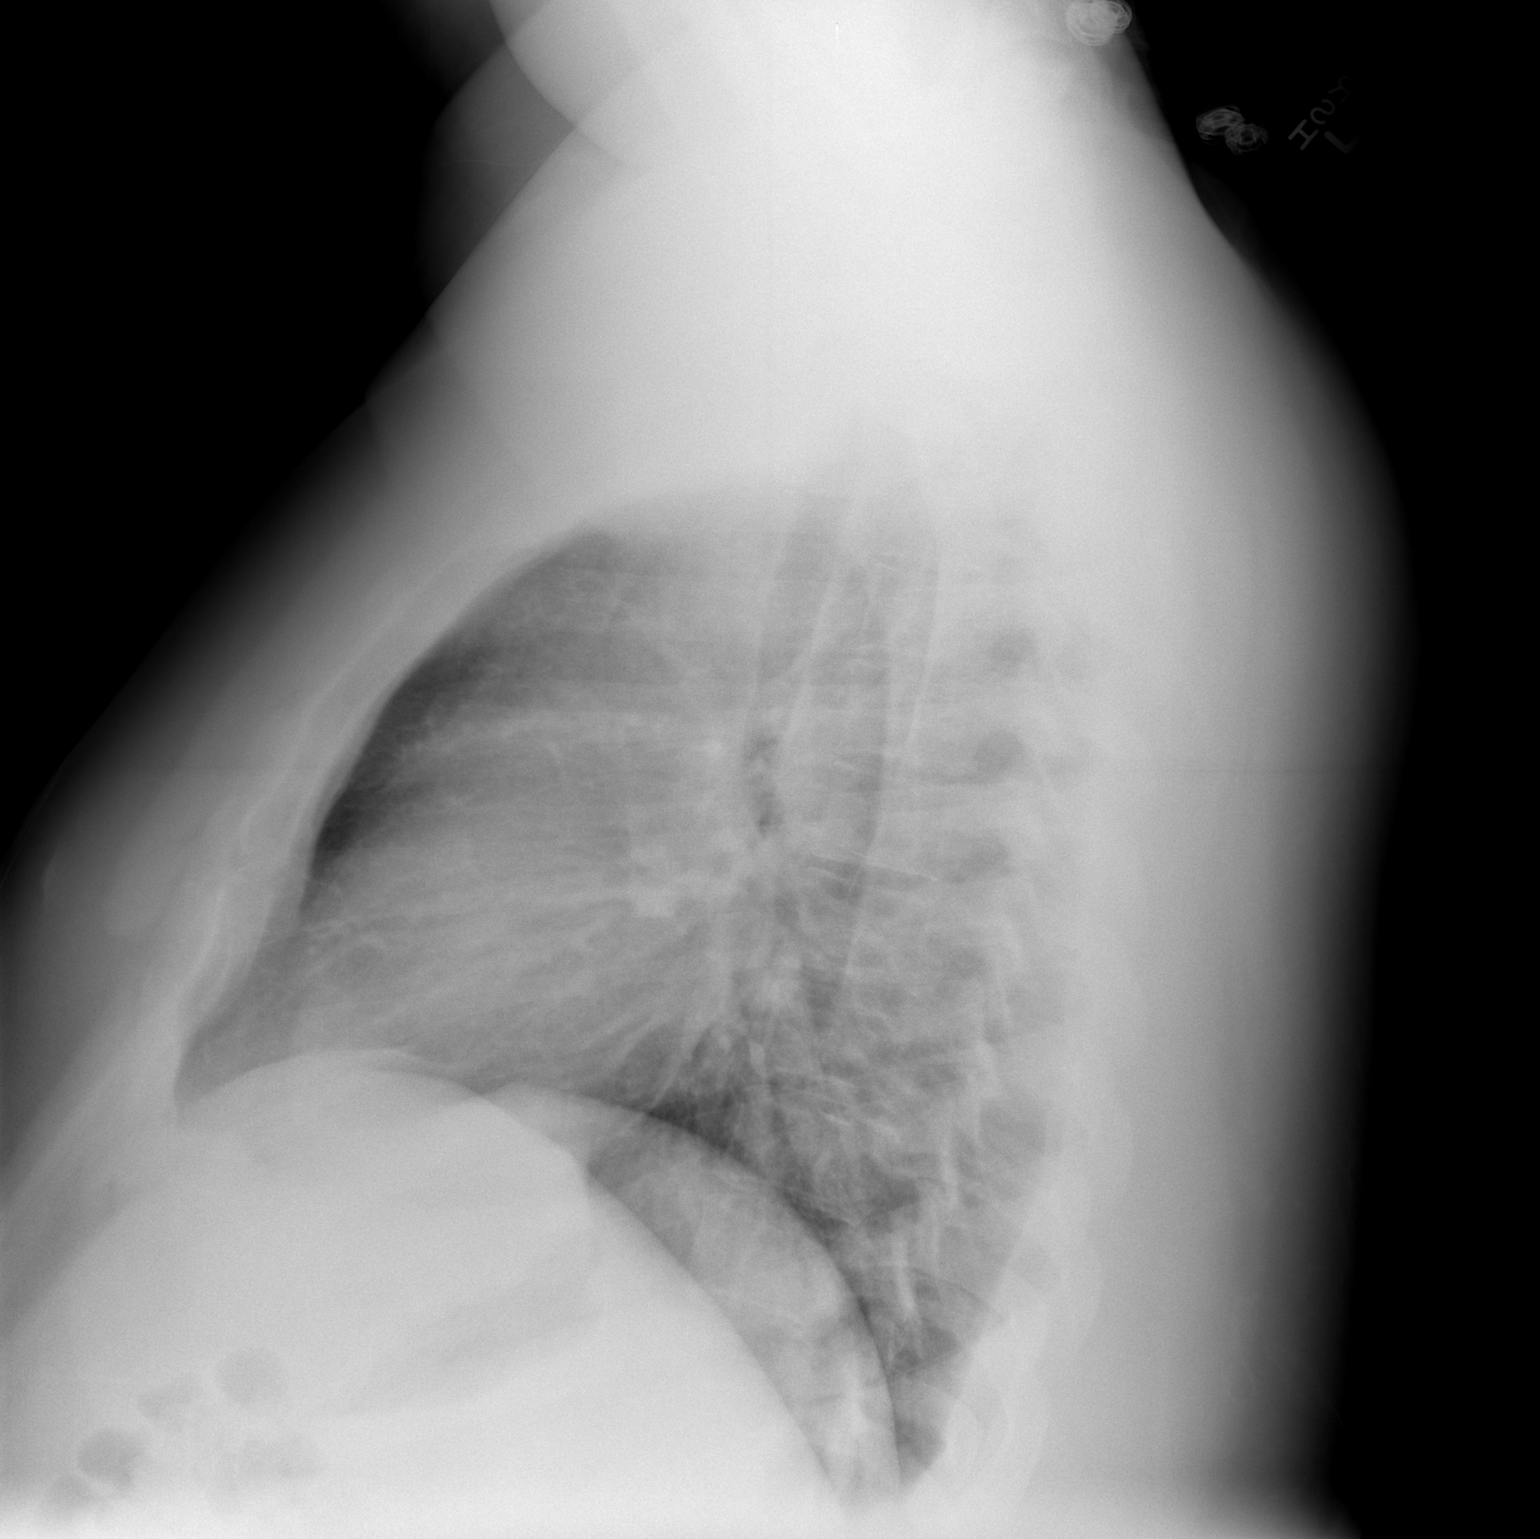

[2 of 2 positions shown; findings below may reference images not displayed]

FINDINGS: Cardiomediastinal silhouette is unremarkable. No acute infiltrate or
pleural effusion. No pulmonary edema. Bony thorax is unremarkable.
IMPRESSION: No active cardiopulmonary disease.

## 2015-09-02 IMAGING — CR DG CHEST 2V
2 series · 2 of 2 positions shown · non-contrast
Comparison: Chest radiograph May 02, 2014

CLINICAL DATA: Productive cough beginning [REDACTED], severe
coughing attacks, hematemesis.

EXAM:
CHEST  2 VIEW

[view not recorded (1 of 2)]
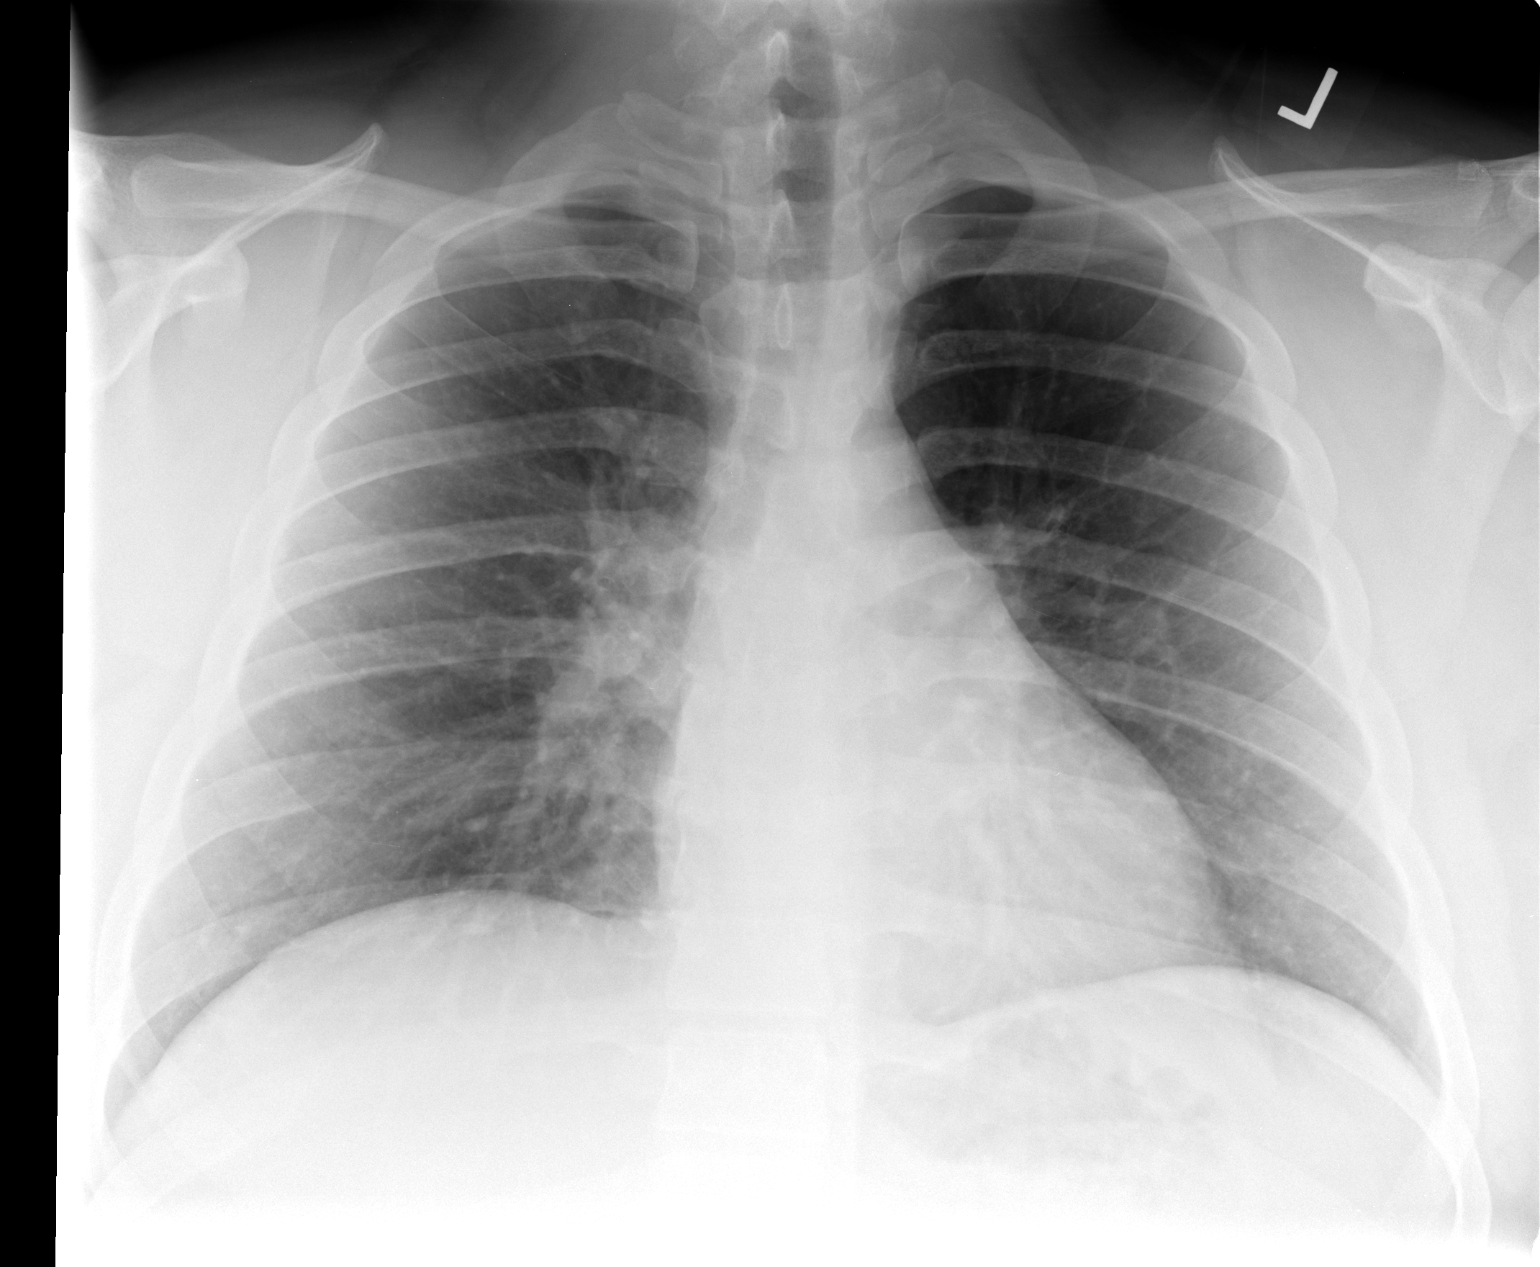

[view not recorded (2 of 2)]
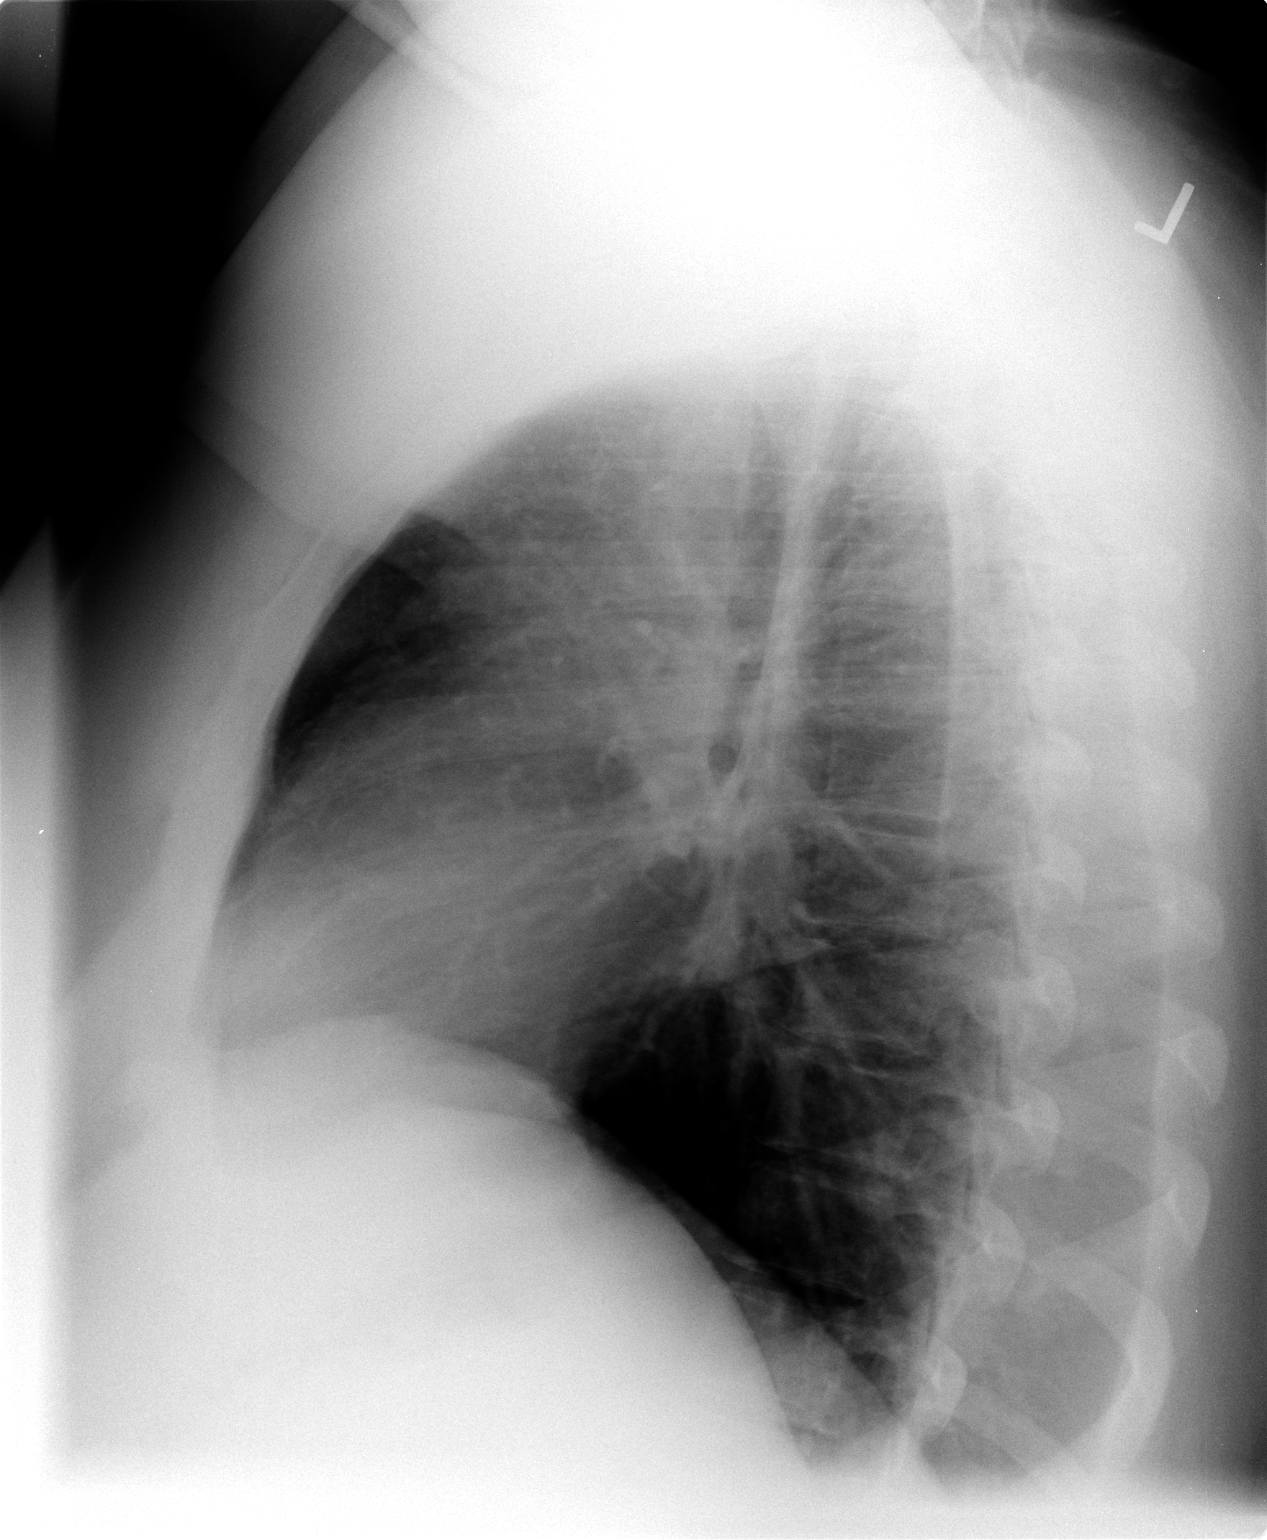

[2 of 2 positions shown; findings below may reference images not displayed]

FINDINGS: Cardiomediastinal silhouette is unremarkable. The lungs are clear
without pleural effusions or focal consolidations. Trachea projects
midline and there is no pneumothorax. Soft tissue planes and
included osseous structures are non-suspicious.
IMPRESSION: No active cardiopulmonary disease.

  By: Rudion Femer

## 2015-09-06 ENCOUNTER — Ambulatory Visit (INDEPENDENT_AMBULATORY_CARE_PROVIDER_SITE_OTHER): Payer: BLUE CROSS/BLUE SHIELD

## 2015-09-06 ENCOUNTER — Ambulatory Visit (INDEPENDENT_AMBULATORY_CARE_PROVIDER_SITE_OTHER): Payer: BLUE CROSS/BLUE SHIELD | Admitting: Family Medicine

## 2015-09-06 VITALS — BP 120/80 | HR 80 | Temp 98.9°F | Resp 16 | Ht 72.0 in | Wt 354.0 lb

## 2015-09-06 DIAGNOSIS — J40 Bronchitis, not specified as acute or chronic: Secondary | ICD-10-CM

## 2015-09-06 DIAGNOSIS — M545 Low back pain, unspecified: Secondary | ICD-10-CM

## 2015-09-06 DIAGNOSIS — S39012A Strain of muscle, fascia and tendon of lower back, initial encounter: Secondary | ICD-10-CM

## 2015-09-06 DIAGNOSIS — R05 Cough: Secondary | ICD-10-CM

## 2015-09-06 DIAGNOSIS — J069 Acute upper respiratory infection, unspecified: Secondary | ICD-10-CM | POA: Diagnosis not present

## 2015-09-06 DIAGNOSIS — R059 Cough, unspecified: Secondary | ICD-10-CM

## 2015-09-06 MED ORDER — IPRATROPIUM BROMIDE 0.03 % NA SOLN: 2.0000 | Freq: Two times a day (BID) | NASAL | Status: DC

## 2015-09-06 MED ORDER — METHOCARBAMOL 750 MG PO TABS: 750.0000 mg | ORAL_TABLET | Freq: Four times a day (QID) | ORAL | Status: DC

## 2015-09-06 MED ORDER — ALBUTEROL SULFATE HFA 108 (90 BASE) MCG/ACT IN AERS: 2.0000 | INHALATION_SPRAY | RESPIRATORY_TRACT | Status: DC | PRN

## 2015-09-06 MED ORDER — HYDROCODONE-ACETAMINOPHEN 5-325 MG PO TABS: 1.0000 | ORAL_TABLET | Freq: Four times a day (QID) | ORAL | Status: DC | PRN

## 2015-09-06 MED ORDER — HYDROCODONE-HOMATROPINE 5-1.5 MG/5ML PO SYRP: 5.0000 mL | ORAL_SOLUTION | Freq: Three times a day (TID) | ORAL | Status: DC | PRN

## 2015-09-06 NOTE — Progress Notes (Addendum)
Patient ID: Omar Krause, male    DOB: August 19, 1991  Age: 24 y.o. MRN: [id number]  Chief Complaint  Patient presents with  . Back Pain    x 3 days, lower back pain   . Sinus Problem    x 1 day     Subjective:   24 year old male who helped lift his father also for a few days ago and injured his low back. He's not had back problems much in the past.  Low back pain since then. No radiculopathy. He knows that his weight is a problem.  Also is having some sinus problems.  Current allergies, medications, problem list, past/family and social histories reviewed.  Objective:  BP 120/80 mmHg  Pulse 80  Temp(Src) 98.9 F (37.2 C) (Oral)  Resp 16  Ht 6' (1.829 m)  Wt 354 lb (160.573 kg)  BMI 48.00 kg/m2  SpO2 98%  Obese male. Sweating a lot. TMs normal. Nose congested.. Throat clear. Neck supple without nodes chest has a little wheezing in the right lower lobe anterior  Lumbar tenderness and paraspinous tenderness in the very low back. Lots of pain on flexion anteriorly.  Assessment & Plan:   Assessment: 1. Low back strain, initial encounter   2. Bilateral low back pain without sciatica   3. Acute upper respiratory infection   4. Cough   5. Bronchitis       Plan:   Orders Placed This Encounter  Procedures  . DG Lumbar Spine 2-3 Views    Order Specific Question:  Reason for Exam (SYMPTOM  OR DIAGNOSIS REQUIRED)    Answer:  low back strain and pain    Order Specific Question:  Preferred imaging location?    Answer:  External    Meds ordered this encounter  Medications  . HYDROcodone-homatropine (HYCODAN) 5-1.5 MG/5ML syrup    Sig: Take 5 mLs by mouth every 8 (eight) hours as needed for cough.    Dispense:  120 mL    Refill:  0  . albuterol (PROVENTIL HFA;VENTOLIN HFA) 108 (90 BASE) MCG/ACT inhaler    Sig: Inhale 2 puffs into the lungs every 4 (four) hours as needed for wheezing or shortness of breath (cough, shortness of breath or wheezing.).    Dispense:  1  Inhaler    Refill:  1  . ipratropium (ATROVENT) 0.03 % nasal spray    Sig: Place 2 sprays into both nostrils 2 (two) times daily.    Dispense:  30 mL    Refill:  0  . methocarbamol (ROBAXIN-750) 750 MG tablet    Sig: Take 1 tablet (750 mg total) by mouth 4 (four) times daily.    Dispense:  30 tablet    Refill:  0  . HYDROcodone-acetaminophen (NORCO) 5-325 MG tablet    Sig: Take 1 tablet by mouth every 6 (six) hours as needed.    Dispense:  15 tablet    Refill:  0         Patient Instructions  For your cold make sure you drink plenty of fluids and get enough rest  Take the Atrovent nasal 2 sprays each nostril 4 times daily as needed for congestion  Use the albuterol inhaler 2 inhalations every 4-6 hours if needed for wheezing and coughing  Take the hydrocodone pain pills every 6 hours only when needed for severe back pain, especially at nighttime  Take the methocarbamol muscle relaxant 750 mg 1 pill 3-4 times daily as needed for muscle relaxant  Use ice or  heat or alternate as needed for back  Do gentle stretches  Return if not improving    [facility] reading (PRIMARY) by  Dr. Bevely PalmerHopper Straightening of lumbar spine.     Return if symptoms worsen or fail to improve.   Charlei Ramsaran, MD 09/06/2015

## 2015-09-06 NOTE — Patient Instructions (Signed)
For your cold make sure you drink plenty of fluids and get enough rest  Take the Atrovent nasal 2 sprays each nostril 4 times daily as needed for congestion  Use the albuterol inhaler 2 inhalations every 4-6 hours if needed for wheezing and coughing  Take the hydrocodone pain pills every 6 hours only when needed for severe back pain, especially at nighttime  Take the methocarbamol muscle relaxant 750 mg 1 pill 3-4 times daily as needed for muscle relaxant  Use ice or heat or alternate as needed for back  Do gentle stretches  Return if not improving

## 2015-09-08 ENCOUNTER — Emergency Department (HOSPITAL_COMMUNITY): Payer: BLUE CROSS/BLUE SHIELD

## 2015-09-08 ENCOUNTER — Emergency Department (HOSPITAL_COMMUNITY)
Admission: EM | Admit: 2015-09-08 | Discharge: 2015-09-08 | Disposition: A | Discharge status: Home / Self Care | Payer: BLUE CROSS/BLUE SHIELD | Attending: Emergency Medicine | Admitting: Emergency Medicine

## 2015-09-08 ENCOUNTER — Encounter (HOSPITAL_COMMUNITY): Payer: Self-pay | Admitting: Emergency Medicine

## 2015-09-08 DIAGNOSIS — I10 Essential (primary) hypertension: Secondary | ICD-10-CM | POA: Diagnosis not present

## 2015-09-08 DIAGNOSIS — R69 Illness, unspecified: Secondary | ICD-10-CM

## 2015-09-08 DIAGNOSIS — J111 Influenza due to unidentified influenza virus with other respiratory manifestations: Secondary | ICD-10-CM | POA: Diagnosis not present

## 2015-09-08 DIAGNOSIS — F329 Major depressive disorder, single episode, unspecified: Secondary | ICD-10-CM | POA: Insufficient documentation

## 2015-09-08 DIAGNOSIS — R079 Chest pain, unspecified: Secondary | ICD-10-CM | POA: Diagnosis not present

## 2015-09-08 DIAGNOSIS — Z791 Long term (current) use of non-steroidal anti-inflammatories (NSAID): Secondary | ICD-10-CM | POA: Insufficient documentation

## 2015-09-08 DIAGNOSIS — J40 Bronchitis, not specified as acute or chronic: Secondary | ICD-10-CM

## 2015-09-08 DIAGNOSIS — Z79899 Other long term (current) drug therapy: Secondary | ICD-10-CM | POA: Insufficient documentation

## 2015-09-08 DIAGNOSIS — M545 Low back pain: Secondary | ICD-10-CM | POA: Insufficient documentation

## 2015-09-08 DIAGNOSIS — R05 Cough: Secondary | ICD-10-CM | POA: Diagnosis present

## 2015-09-08 DIAGNOSIS — J45901 Unspecified asthma with (acute) exacerbation: Secondary | ICD-10-CM | POA: Diagnosis not present

## 2015-09-08 LAB — COMPREHENSIVE METABOLIC PANEL
ALBUMIN: 4.4 g/dL (ref 3.5–5.0)
ALT: 36 U/L (ref 17–63)
AST: 28 U/L (ref 15–41)
Alkaline Phosphatase: 60 U/L (ref 38–126)
Anion gap: 7 (ref 5–15)
BUN: 9 mg/dL (ref 6–20)
CO2: 24 mmol/L (ref 22–32)
CREATININE: 0.83 mg/dL (ref 0.61–1.24)
Calcium: 8.9 mg/dL (ref 8.9–10.3)
Chloride: 105 mmol/L (ref 101–111)
GFR calc Af Amer: >60 mL/min (ref >60)
GFR calc non Af Amer: >60 mL/min (ref >60)
GLUCOSE: 92 mg/dL (ref 65–99)
Potassium: 4 mmol/L (ref 3.5–5.1)
Sodium: 136 mmol/L (ref 135–145)
Total Bilirubin: 0.6 mg/dL (ref 0.3–1.2)
Total Protein: 8 g/dL (ref 6.5–8.1)

## 2015-09-08 LAB — CBC WITH DIFFERENTIAL/PLATELET
Basophils Absolute: 0 10*3/uL (ref 0.0–0.1)
Basophils Relative: 0 %
Eosinophils Absolute: 0.4 10*3/uL (ref 0.0–0.7)
Eosinophils Relative: 3 %
HEMATOCRIT: 42.4 % (ref 39.0–52.0)
Hemoglobin: 14.6 g/dL (ref 13.0–17.0)
LYMPHS PCT: 26 %
Lymphs Abs: 3.7 10*3/uL (ref 0.7–4.0)
MCH: 29.9 pg (ref 26.0–34.0)
MCHC: 34.4 g/dL (ref 30.0–36.0)
MCV: 86.9 fL (ref 78.0–100.0)
Monocytes Absolute: 1.1 10*3/uL — ABNORMAL HIGH (ref 0.1–1.0)
Monocytes Relative: 8 %
NEUTROS ABS: 8.8 10*3/uL — AB (ref 1.7–7.7)
Neutrophils Relative %: 63 %
Platelets: 268 10*3/uL (ref 150–400)
RBC: 4.88 MIL/uL (ref 4.22–5.81)
RDW: 13.4 % (ref 11.5–15.5)
WBC: 14 10*3/uL — ABNORMAL HIGH (ref 4.0–10.5)

## 2015-09-08 LAB — LIPASE, BLOOD: Lipase: 19 U/L (ref 11–51)

## 2015-09-08 MED ORDER — ALBUTEROL SULFATE (2.5 MG/3ML) 0.083% IN NEBU
5.0000 mg | INHALATION_SOLUTION | Freq: Once | RESPIRATORY_TRACT | Status: AC
  Administered 2015-09-08: 5 mg via RESPIRATORY_TRACT
  Filled 2015-09-08: qty 6

## 2015-09-08 MED ORDER — METHYLPREDNISOLONE SODIUM SUCC 125 MG IJ SOLR
125.0000 mg | Freq: Once | INTRAMUSCULAR | Status: AC
  Administered 2015-09-08: 125 mg via INTRAVENOUS
  Filled 2015-09-08: qty 2

## 2015-09-08 MED ORDER — KETOROLAC TROMETHAMINE 30 MG/ML IJ SOLN
30.0000 mg | Freq: Once | INTRAMUSCULAR | Status: AC
  Administered 2015-09-08: 30 mg via INTRAVENOUS
  Filled 2015-09-08: qty 1

## 2015-09-08 MED ORDER — IPRATROPIUM-ALBUTEROL 0.5-2.5 (3) MG/3ML IN SOLN
3.0000 mL | Freq: Once | RESPIRATORY_TRACT | Status: AC
  Administered 2015-09-08: 3 mL via RESPIRATORY_TRACT
  Filled 2015-09-08: qty 3

## 2015-09-08 MED ORDER — SODIUM CHLORIDE 0.9 % IV BOLUS (SEPSIS)
1000.0000 mL | Freq: Once | INTRAVENOUS | Status: AC
  Administered 2015-09-08: 1000 mL via INTRAVENOUS

## 2015-09-08 MED ORDER — PREDNISONE 20 MG PO TABS: 60.0000 mg | ORAL_TABLET | Freq: Every day | ORAL | Status: AC

## 2015-09-08 NOTE — ED Notes (Signed)
Pt c/o of general malaise, body aches, cough, and vomiting x 3 days.

## 2015-09-08 NOTE — ED Provider Notes (Signed)
CSN: [id number]     Arrival date & time 09/08/15  1036 History  By signing my name below, I, Omar Krause, attest that this documentation has been prepared under the direction and in the presence of [name], MD. Electronically Signed: [name], ED Scribe. 09/08/2015. 12:19 PM.    Chief Complaint  Patient presents with  . Generalized Body Aches   The history is provided by the patient. No language interpreter was used.    HPI Comments: Omar Krause is a 24 y.o. male with a history of asthma, HTN, and morbid obesity, who presents to the Emergency Department with multiple complaints, including a cough, lower back pain, vomiting, fever, and chills, all with onset 3 days ago. He also endorses associated chest pain but states it only occurs after coughing. Patient had been to [facility] in [place] and saw [name] for his symptoms 2 days ago; he was prescribed Robaxin and hydrocodone for symptom relief, which he has taken with mild relief. Patient is on Prozac for depression and losartan for HTN; he states he has been compliant with his medications. He denies a history of any known cardiac conditions. He denies confusion, syncope, or swelling. PCP: [name], MD     Past Medical History  Diagnosis Date  . Morbid obesity (HCC)   . Hypertension   . Depression   . Asthma     No inhaler at home, has not had issues since he was child   Past Surgical History  Procedure Laterality Date  . Tonsillectomy and adenoidectomy     Family History  Problem Relation Age of Onset  . Arthritis Mother   . Diabetes Mother   . Alcohol abuse Father   . Arthritis Father   . Hyperlipidemia Father   . Stroke Father   . Hypertension Father   . Mental illness Father   . Diabetes Father   . Arthritis Maternal Grandmother   . Stroke Maternal Grandmother   . Diabetes Maternal Grandmother   . Arthritis Maternal Grandfather   . Heart attack Maternal Grandfather 50  . Diabetes Maternal  Grandfather    Social History  Substance Use Topics  . Smoking status: Never Smoker   . Smokeless tobacco: Never Used  . Alcohol Use: 0.5 oz/week    1 drink(s) per week     Comment: occasionally    Review of Systems  Constitutional: Positive for fever and chills.       Per HPI, otherwise negative  HENT:       Per HPI, otherwise negative  Respiratory: Positive for cough.        Per HPI, otherwise negative  Cardiovascular: Positive for chest pain (only with coughing). Negative for leg swelling.       Per HPI, otherwise negative  Gastrointestinal: Positive for vomiting.  Endocrine:       Negative aside from HPI  Genitourinary:       Neg aside from HPI   Musculoskeletal: Positive for back pain.       Per HPI, otherwise negative  Skin: Negative.   Neurological: Negative for syncope.  Psychiatric/Behavioral: Negative for confusion.   Allergies  Ceftin  Home Medications   Prior to Admission medications   Medication Sig Start Date End Date Taking? Authorizing Provider  albuterol (PROVENTIL HFA;VENTOLIN HFA) 108 (90 BASE) MCG/ACT inhaler Inhale 2 puffs into the lungs every 4 (four) hours as needed for wheezing or shortness of breath (cough, shortness of breath or wheezing.). 09/06/15  Yes  [name], MD  dextromethorphan-guaiFENesin [facility] DM) 30-600 MG 12hr tablet Take 1 tablet by mouth 2 (two) times daily.   Yes Historical Provider, MD  FLUoxetine (PROZAC) 40 MG capsule Take 2 capsules (80 mg total) by mouth daily. 11/23/14  Yes [name], MD  HYDROcodone-acetaminophen (NORCO) 5-325 MG tablet Take 1 tablet by mouth every 6 (six) hours as needed. 09/06/15  Yes [name], MD  ipratropium (ATROVENT) 0.03 % nasal spray Place 2 sprays into both nostrils 2 (two) times daily. 09/06/15  Yes [name], MD  losartan (COZAAR) 100 MG tablet Take 1 tablet (100 mg total) by mouth daily. 11/23/14  Yes [name], MD  methocarbamol (ROBAXIN-750) 750 MG tablet Take 1  tablet (750 mg total) by mouth 4 (four) times daily. 09/06/15  Yes [name], MD  Multiple Vitamin (MULTIVITAMIN WITH MINERALS) TABS tablet Take 1 tablet by mouth daily.   Yes Historical Provider, MD  naproxen sodium (ANAPROX) 220 MG tablet Take 440 mg by mouth 2 (two) times daily with a meal.   Yes Historical Provider, MD  pantoprazole (PROTONIX) 40 MG tablet Take 1 tablet (40 mg total) by mouth daily. 11/23/14  Yes [name], MD  acetaminophen (TYLENOL) 500 MG tablet Take 2 tablets (1,000 mg total) by mouth every 8 (eight) hours as needed. Take 2 tabs every 8 hours. Patient not taking: Reported on 09/06/2015 12/13/14   [name], PA-C  HYDROcodone-homatropine [facility]) 5-1.5 MG/5ML syrup Take 5 mLs by mouth every 8 (eight) hours as needed for cough. Patient not taking: Reported on 09/08/2015 09/06/15   [name], MD  predniSONE (DELTASONE) 20 MG tablet Take 3 PO QAM x3days, 2 PO QAM x3days, 1 PO QAM x3days Patient not taking: Reported on 12/13/2014 09/01/14   [name], PA-C   BP 165/82 mmHg  Pulse 76  Temp(Src) 98.2 F (36.8 C) (Oral)  Resp 20  Ht 6' (1.829 m)  Wt 355 lb (161.027 kg)  BMI 48.14 kg/m2  SpO2 97% Physical Exam  Constitutional: He is oriented to person, place, and time. He appears well-developed. No distress.  Obese young male, sitting upright speaking clearly, awake, alert, answering questions properly.  HENT:  Head: Normocephalic and atraumatic.  Eyes: Conjunctivae and EOM are normal.  Cardiovascular: Normal rate and regular rhythm.   Pulmonary/Chest: Effort normal. No stridor. No respiratory distress. He has wheezes.  Bilateral wheezing.  Abdominal: He exhibits no distension.  Musculoskeletal: He exhibits no edema.  Neurological: He is alert and oriented to person, place, and time.  Skin: Skin is warm and dry.  Psychiatric: He has a normal mood and affect.  Nursing note and vitals reviewed.   ED Course  Procedures (including  critical care time)  DIAGNOSTIC STUDIES: Oxygen Saturation is 97% on RA, normal by my interpretation.    COORDINATION OF CARE: 11:50 AM - Discussed treatment plan with pt at bedside which includes blood tests, XR, breathing treatment. Pt verbalized understanding and agreed to plan.   Labs Review Labs Reviewed  CBC WITH DIFFERENTIAL/PLATELET - Abnormal; Notable for the following:    WBC 14.0 (*)    Neutro Abs 8.8 (*)    Monocytes Absolute 1.1 (*)    All other components within normal limits  COMPREHENSIVE METABOLIC PANEL  LIPASE, BLOOD    Imaging Review Dg Lumbar Spine 2-3 Views  09/06/2015  CLINICAL DATA:  Lumbar pain. EXAM: LUMBAR SPINE - 2-3 VIEW COMPARISON:  None. FINDINGS: There is  no evidence of lumbar spine fracture. Alignment is normal. Intervertebral disc spaces are maintained. IMPRESSION: Negative. Electronically Signed   By: Elberta Fortisaniel  Boyle M.D.   On: 09/06/2015 17:57   I have personally reviewed and evaluated these images and lab results as part of my medical decision-making.  On repeat exam the patient is with alert, states that he feels marginally better, continues to have diffuse discomfort, but diminished work of breathing. Lung sounds are improved, though there is persistent mild wheezing.  Patient adds that his mother is currently being treated for the same symptoms.   MDM   I personally performed the services described in this documentation, which was scribed in my presence. The recorded information has been reviewed and is accurate.    Young male with baseline respiratory condition now presents with ongoing generalized discomfort, cough, nausea, vomiting, for about 3 days. Patient has influenza-like illness, no evidence for bacteremia, sepsis, pneumonia. Given his baseline reactive airway disease, patient received steroids, multiple albuterol therapy sessions. Patient improved substantially here, and though he has mild ongoing discomfort, was appropriate for  discharge with further evaluation, management as an outpatient.  Gerhard Munchobert Dutchess Crosland, MD 09/08/15 [phone]

## 2015-09-08 NOTE — Discharge Instructions (Signed)
Hasn't discussed, your condition is likely due to a viral illness, complicated by your baseline respiratory issues.  Please be sure to take all medication as directed, and use your albuterol therapy every 4 hours for the next 2 days.  Return here for concerning changes in your condition.

## 2015-09-13 ENCOUNTER — Telehealth: Payer: Self-pay | Admitting: Family Medicine

## 2015-09-13 NOTE — Telephone Encounter (Signed)
Caller name: Dondra SpryGail   Relationship to patient: Mother   Can be reached: [phone]   Reason for call: Mother called in to see if provider could see her son tomorrow as well. (only showing same day appts) She says that they both had to go to the ED because they were having a lot of congestion to the point that it was difficult to breath.  I have also scheduled her for tomorrow.     Please advise further for scheduling

## 2015-09-13 NOTE — Telephone Encounter (Signed)
Ok was given earlier to schedule we are doc of the day he is ok to be seen in a same day spot.

## 2015-09-13 NOTE — Telephone Encounter (Signed)
This is our pt as well, ok to schedule him also.

## 2015-09-13 NOTE — Telephone Encounter (Signed)
Called pt's mother back to schedule appt. Mother was unavailable. I left vm per our discussion (w/mom) to call back in to schedule.    Thanks.

## 2015-09-13 NOTE — Telephone Encounter (Signed)
Thanks. I am aware, Im asking if it is okay to schedule him in a same day slot, (note below)  as he is attempting to come in tomorrow.

## 2015-09-13 NOTE — Telephone Encounter (Signed)
Caller name:Rampey,Gail Relation to pt: mother  Call back [phone]   Reason for call:  Call returned patient scheduled for 09/14/15 2:45pm.

## 2015-09-14 ENCOUNTER — Ambulatory Visit (INDEPENDENT_AMBULATORY_CARE_PROVIDER_SITE_OTHER): Payer: BLUE CROSS/BLUE SHIELD | Admitting: Family Medicine

## 2015-09-14 ENCOUNTER — Encounter: Payer: Self-pay | Admitting: Family Medicine

## 2015-09-14 VITALS — BP 136/86 | HR 64 | Temp 98.5°F | Resp 17 | Ht 72.0 in | Wt 346.0 lb

## 2015-09-14 DIAGNOSIS — J0100 Acute maxillary sinusitis, unspecified: Secondary | ICD-10-CM

## 2015-09-14 DIAGNOSIS — J45901 Unspecified asthma with (acute) exacerbation: Secondary | ICD-10-CM

## 2015-09-14 DIAGNOSIS — J4521 Mild intermittent asthma with (acute) exacerbation: Secondary | ICD-10-CM | POA: Diagnosis not present

## 2015-09-14 DIAGNOSIS — J4520 Mild intermittent asthma, uncomplicated: Secondary | ICD-10-CM | POA: Insufficient documentation

## 2015-09-14 MED ORDER — BECLOMETHASONE DIPROPIONATE 80 MCG/ACT IN AERS: 1.0000 | INHALATION_SPRAY | Freq: Two times a day (BID) | RESPIRATORY_TRACT | Status: DC

## 2015-09-14 MED ORDER — SULFAMETHOXAZOLE-TRIMETHOPRIM 800-160 MG PO TABS: 1.0000 | ORAL_TABLET | Freq: Two times a day (BID) | ORAL | Status: DC

## 2015-09-14 MED ORDER — IPRATROPIUM-ALBUTEROL 0.5-2.5 (3) MG/3ML IN SOLN
3.0000 mL | Freq: Once | RESPIRATORY_TRACT | Status: AC
  Administered 2015-09-14: 3 mL via RESPIRATORY_TRACT

## 2015-09-14 NOTE — Assessment & Plan Note (Signed)
New.  Pt w/ acute asthma exacerbation.  Wheezing improved s/p neb tx in office.  Start daily Qvar until feeling better.  Continue albuterol prn.  Start Bactrim for sinus infxn and possible respiratory infection.  Cough meds prn.  Reviewed supportive care and red flags that should prompt return.  Pt expressed understanding and is in agreement w/ plan.

## 2015-09-14 NOTE — Progress Notes (Signed)
   Subjective:    Patient ID: Omar Krause, male    DOB: July 23, 1991, 24 y.o.   MRN: [id number]  HPI ER f/u- pt was seen 11/17 for URI.  Hx of asthma.  Pt did not get abx in ER.  Received multiple breathing txs and Prednisone.  Still having SOB but better than previously.  No fevers.  + sinus pain/pressure.  No N/V.  Some wheezing.  Using albuterol nearly q4.     Review of Systems For ROS see HPI     Objective:   Physical Exam  Constitutional: He is oriented to person, place, and time. He appears well-developed and well-nourished. No distress.  Obese, sweating profusely  HENT:  Head: Normocephalic and atraumatic.  Nose: Nose normal.  Mouth/Throat: Oropharynx is clear and moist. No oropharyngeal exudate.  +TTP over frontal and maxillary sinuses  Eyes: Conjunctivae and EOM are normal. Pupils are equal, round, and reactive to light.  Neck: Normal range of motion. Neck supple.  Pulmonary/Chest: He has wheezes. He has no rales.  Diffuse expiratory wheezes- improved s/p neb tx + hacking cough  Lymphadenopathy:    He has no cervical adenopathy.  Neurological: He is alert and oriented to person, place, and time.  Skin: Skin is warm.  diaphoretic  Vitals reviewed.         Assessment & Plan:

## 2015-09-14 NOTE — Patient Instructions (Signed)
Follow up as needed Start the Bactrim twice daily- take w/ food Drink plenty of fluids Use the Qvar- 1 puff twice daily until feeling better Continue to use the albuterol as needed Mucinex DM for cough/congestion Call with any questions or concerns Hang in there! Happy Thanksgiving!

## 2015-09-14 NOTE — Assessment & Plan Note (Signed)
New.  Pt's sxs and PE consistent w/ infxn.  Start abx.  Reviewed supportive care and red flags that should prompt return.  Pt expressed understanding and is in agreement w/ plan.  

## 2015-09-14 NOTE — Progress Notes (Signed)
Pre visit review using our clinic review tool, if applicable. No additional management support is needed unless otherwise documented below in the visit note. 

## 2015-09-22 ENCOUNTER — Telehealth: Payer: Self-pay | Admitting: *Deleted

## 2015-09-22 NOTE — Telephone Encounter (Signed)
Approved effective from 09/22/2015 through 10/21/2038  

## 2015-09-22 NOTE — Telephone Encounter (Signed)
PA initiated. Awaiting determination. JG//CMA 

## 2015-10-04 ENCOUNTER — Emergency Department (HOSPITAL_COMMUNITY)
Admission: EM | Admit: 2015-10-04 | Discharge: 2015-10-04 | Disposition: A | Discharge status: Home / Self Care | Payer: BLUE CROSS/BLUE SHIELD | Attending: Emergency Medicine | Admitting: Emergency Medicine

## 2015-10-04 ENCOUNTER — Encounter (HOSPITAL_COMMUNITY): Payer: Self-pay | Admitting: Emergency Medicine

## 2015-10-04 DIAGNOSIS — J45909 Unspecified asthma, uncomplicated: Secondary | ICD-10-CM | POA: Diagnosis not present

## 2015-10-04 DIAGNOSIS — F329 Major depressive disorder, single episode, unspecified: Secondary | ICD-10-CM | POA: Diagnosis not present

## 2015-10-04 DIAGNOSIS — I10 Essential (primary) hypertension: Secondary | ICD-10-CM | POA: Diagnosis not present

## 2015-10-04 DIAGNOSIS — R1031 Right lower quadrant pain: Secondary | ICD-10-CM | POA: Diagnosis present

## 2015-10-04 DIAGNOSIS — Z79899 Other long term (current) drug therapy: Secondary | ICD-10-CM | POA: Insufficient documentation

## 2015-10-04 DIAGNOSIS — K529 Noninfective gastroenteritis and colitis, unspecified: Secondary | ICD-10-CM

## 2015-10-04 LAB — CBC WITH DIFFERENTIAL/PLATELET
Basophils Absolute: 0 10*3/uL (ref 0.0–0.1)
Basophils Relative: 0 %
EOS ABS: 0.1 10*3/uL (ref 0.0–0.7)
Eosinophils Relative: 1 %
HCT: 46.2 % (ref 39.0–52.0)
HEMOGLOBIN: 16 g/dL (ref 13.0–17.0)
LYMPHS ABS: 1.2 10*3/uL (ref 0.7–4.0)
Lymphocytes Relative: 7 %
MCH: 30.2 pg (ref 26.0–34.0)
MCHC: 34.6 g/dL (ref 30.0–36.0)
MCV: 87.2 fL (ref 78.0–100.0)
MONOS PCT: 4 %
Monocytes Absolute: 0.7 10*3/uL (ref 0.1–1.0)
NEUTROS PCT: 88 %
Neutro Abs: 13.6 10*3/uL — ABNORMAL HIGH (ref 1.7–7.7)
Platelets: 293 10*3/uL (ref 150–400)
RBC: 5.3 MIL/uL (ref 4.22–5.81)
RDW: 13.5 % (ref 11.5–15.5)
WBC: 15.5 10*3/uL — ABNORMAL HIGH (ref 4.0–10.5)

## 2015-10-04 LAB — COMPREHENSIVE METABOLIC PANEL
ALK PHOS: 65 U/L (ref 38–126)
ALT: 36 U/L (ref 17–63)
ANION GAP: 12 (ref 5–15)
AST: 27 U/L (ref 15–41)
Albumin: 4.4 g/dL (ref 3.5–5.0)
BILIRUBIN TOTAL: 0.8 mg/dL (ref 0.3–1.2)
BUN: 12 mg/dL (ref 6–20)
CALCIUM: 9.1 mg/dL (ref 8.9–10.3)
CO2: 21 mmol/L — AB (ref 22–32)
Chloride: 105 mmol/L (ref 101–111)
Creatinine, Ser: 0.86 mg/dL (ref 0.61–1.24)
Glucose, Bld: 115 mg/dL — ABNORMAL HIGH (ref 65–99)
Potassium: 3.7 mmol/L (ref 3.5–5.1)
SODIUM: 138 mmol/L (ref 135–145)
TOTAL PROTEIN: 7.8 g/dL (ref 6.5–8.1)

## 2015-10-04 LAB — LIPASE, BLOOD: LIPASE: 21 U/L (ref 11–51)

## 2015-10-04 MED ORDER — SODIUM CHLORIDE 0.9 % IV BOLUS (SEPSIS)
1000.0000 mL | Freq: Once | INTRAVENOUS | Status: AC
  Administered 2015-10-04: 1000 mL via INTRAVENOUS

## 2015-10-04 MED ORDER — PROMETHAZINE HCL 25 MG PO TABS: 25.0000 mg | ORAL_TABLET | Freq: Four times a day (QID) | ORAL | Status: DC | PRN

## 2015-10-04 MED ORDER — DIPHENOXYLATE-ATROPINE 2.5-0.025 MG PO TABS: 2.0000 | ORAL_TABLET | Freq: Four times a day (QID) | ORAL | Status: DC | PRN

## 2015-10-04 MED ORDER — ONDANSETRON HCL 4 MG/2ML IJ SOLN: 4.0000 mg | Freq: Once | INTRAMUSCULAR | Status: DC

## 2015-10-04 MED ORDER — ONDANSETRON HCL 4 MG/2ML IJ SOLN
4.0000 mg | Freq: Once | INTRAMUSCULAR | Status: DC
  Filled 2015-10-04: qty 2

## 2015-10-04 MED ORDER — SODIUM CHLORIDE 0.45 % IV BOLUS: 1000.0000 mL | Freq: Once | INTRAVENOUS | Status: DC

## 2015-10-04 MED ORDER — ONDANSETRON HCL 4 MG/2ML IJ SOLN
4.0000 mg | Freq: Once | INTRAMUSCULAR | Status: AC
  Administered 2015-10-04: 4 mg via INTRAVENOUS

## 2015-10-04 MED ORDER — ACETAMINOPHEN 500 MG PO TABS
1000.0000 mg | ORAL_TABLET | Freq: Once | ORAL | Status: AC
  Administered 2015-10-04: 1000 mg via ORAL
  Filled 2015-10-04: qty 2

## 2015-10-04 NOTE — ED Notes (Signed)
Pt states that he has been having abd pain with n/v/d for the past few days.  States that he cannot keep any fluids down at this time.

## 2015-10-04 NOTE — ED Provider Notes (Signed)
CSN: [id number]     Arrival date & time 10/04/15  1618 History   First MD Initiated Contact with Patient 10/04/15 1627     Chief Complaint  Patient presents with  . Abdominal Pain  . Emesis     (Consider location/radiation/quality/duration/timing/severity/associated sxs/prior Treatment) HPI......Marland Kitchen multiple episodes of nausea, vomiting, diarrhea for 24 hrs.   Minimal abdominal pain. No right lower quadrant tenderness. Patient not keeping fluids down. No fever, sweats, chills, dysuria.  Past Medical History  Diagnosis Date  . Morbid obesity (HCC)   . Hypertension   . Depression   . Asthma     No inhaler at home, has not had issues since he was child   Past Surgical History  Procedure Laterality Date  . Tonsillectomy and adenoidectomy     Family History  Problem Relation Age of Onset  . Arthritis Mother   . Diabetes Mother   . Alcohol abuse Father   . Arthritis Father   . Hyperlipidemia Father   . Stroke Father   . Hypertension Father   . Mental illness Father   . Diabetes Father   . Arthritis Maternal Grandmother   . Stroke Maternal Grandmother   . Diabetes Maternal Grandmother   . Arthritis Maternal Grandfather   . Heart attack Maternal Grandfather 50  . Diabetes Maternal Grandfather    Social History  Substance Use Topics  . Smoking status: Never Smoker   . Smokeless tobacco: Never Used  . Alcohol Use: 0.5 oz/week    1 drink(s) per week     Comment: occasionally    Review of Systems  All other systems reviewed and are negative.     Allergies  Ceftin  Home Medications   Prior to Admission medications   Medication Sig Start Date End Date Taking? Authorizing Provider  albuterol (PROVENTIL HFA;VENTOLIN HFA) 108 (90 BASE) MCG/ACT inhaler Inhale 2 puffs into the lungs every 4 (four) hours as needed for wheezing or shortness of breath (cough, shortness of breath or wheezing.). 09/06/15  Yes [name], MD  FLUoxetine (PROZAC) 40 MG capsule Take 2  capsules (80 mg total) by mouth daily. 11/23/14  Yes [name], MD  HYDROcodone-acetaminophen (NORCO) 5-325 MG tablet Take 1 tablet by mouth every 6 (six) hours as needed. 09/06/15  Yes [name], MD  losartan (COZAAR) 100 MG tablet Take 1 tablet (100 mg total) by mouth daily. 11/23/14  Yes [name], MD  Multiple Vitamin (MULTIVITAMIN WITH MINERALS) TABS tablet Take 1 tablet by mouth daily.   Yes Historical Provider, MD  pantoprazole (PROTONIX) 40 MG tablet Take 1 tablet (40 mg total) by mouth daily. 11/23/14  Yes [name], MD  beclomethasone (QVAR) 80 MCG/ACT inhaler Inhale 1 puff into the lungs 2 (two) times daily. Patient not taking: Reported on 10/04/2015 09/14/15   [name], MD  diphenoxylate-atropine (LOMOTIL) 2.5-0.025 MG tablet Take 2 tablets by mouth 4 (four) times daily as needed for diarrhea or loose stools. 10/04/15   Donnetta Hutching, MD  ipratropium (ATROVENT) 0.03 % nasal spray Place 2 sprays into both nostrils 2 (two) times daily. Patient not taking: Reported on 10/04/2015 09/06/15   [name], MD  methocarbamol (ROBAXIN-750) 750 MG tablet Take 1 tablet (750 mg total) by mouth 4 (four) times daily. Patient not taking: Reported on 10/04/2015 09/06/15   [name], MD  promethazine (PHENERGAN) 25 MG tablet Take 1 tablet (25 mg total) by mouth every 6 (six) hours as needed.  10/04/15   Donnetta HutchingBrian Lorry Anastasi, MD  sulfamethoxazole-trimethoprim (BACTRIM DS,SEPTRA DS) 800-160 MG tablet Take 1 tablet by mouth 2 (two) times daily. Patient not taking: Reported on 10/04/2015 09/14/15   Sheliah HatchKatherine E Tabori, MD   BP 149/81 mmHg  Pulse 116  Temp(Src) 98.1 F (36.7 C) (Oral)  Resp 20  Ht 6' (1.829 m)  Wt 335 lb (151.955 kg)  BMI 45.42 kg/m2  SpO2 98% Physical Exam  Constitutional: He is oriented to person, place, and time.  Obese, min distress  HENT:  Head: Normocephalic and atraumatic.  Eyes: Conjunctivae and EOM are normal. Pupils are equal, round, and  reactive to light.  Neck: Normal range of motion. Neck supple.  Cardiovascular: Normal rate and regular rhythm.   Pulmonary/Chest: Effort normal and breath sounds normal.  Abdominal: Soft. Bowel sounds are normal.  Nontender over McBurney's point.  Musculoskeletal: Normal range of motion.  Neurological: He is alert and oriented to person, place, and time.  Skin: Skin is warm and dry.  Psychiatric: He has a normal mood and affect. His behavior is normal.  Nursing note and vitals reviewed.   ED Course  Procedures (including critical care time) Labs Review Labs Reviewed  CBC WITH DIFFERENTIAL/PLATELET - Abnormal; Notable for the following:    WBC 15.5 (*)    Neutro Abs 13.6 (*)    All other components within normal limits  COMPREHENSIVE METABOLIC PANEL - Abnormal; Notable for the following:    CO2 21 (*)    Glucose, Bld 115 (*)    All other components within normal limits  LIPASE, BLOOD    Imaging Review No results found. I have personally reviewed and evaluated these images and lab results as part of my medical decision-making.   EKG Interpretation None      MDM   Final diagnoses:  Gastroenteritis    No acute abdomen. Patient feels better after 2 L of IV fluids. Discharge medications Lomotil and Phenergan 25 mg.    Donnetta HutchingBrian Fiorela Pelzer, MD 10/04/15 2141

## 2015-10-04 NOTE — ED Notes (Signed)
Pt able to keep liquids down. No vomiting 

## 2015-10-04 NOTE — Discharge Instructions (Signed)
Clear liquids tonight. Medication for diarrhea and nausea. Return if worse

## 2016-01-03 ENCOUNTER — Other Ambulatory Visit: Payer: Self-pay | Admitting: Family Medicine

## 2016-01-04 ENCOUNTER — Other Ambulatory Visit: Payer: Self-pay | Admitting: Family Medicine

## 2016-01-04 NOTE — Telephone Encounter (Signed)
Medication filled to pharmacy as requested.   

## 2016-01-04 NOTE — Telephone Encounter (Signed)
Last OV 09/14/15 (asthma) prozac last filled 12/17/14 #30 with 3 Buspar last filled 08/18/14 #60 with 3

## 2016-01-05 ENCOUNTER — Other Ambulatory Visit: Payer: Self-pay | Admitting: Family Medicine

## 2016-01-05 NOTE — Telephone Encounter (Signed)
Medication filled to pharmacy as requested.   

## 2016-01-06 ENCOUNTER — Ambulatory Visit (INDEPENDENT_AMBULATORY_CARE_PROVIDER_SITE_OTHER): Payer: BLUE CROSS/BLUE SHIELD | Admitting: Family Medicine

## 2016-01-06 ENCOUNTER — Encounter: Payer: Self-pay | Admitting: Family Medicine

## 2016-01-06 DIAGNOSIS — F411 Generalized anxiety disorder: Secondary | ICD-10-CM

## 2016-01-06 DIAGNOSIS — R112 Nausea with vomiting, unspecified: Secondary | ICD-10-CM

## 2016-01-06 LAB — HEPATIC FUNCTION PANEL
ALK PHOS: 65 U/L (ref 39–117)
ALT: 35 U/L (ref 0–53)
AST: 24 U/L (ref 0–37)
Albumin: 4.7 g/dL (ref 3.5–5.2)
BILIRUBIN DIRECT: 0.1 mg/dL (ref 0.0–0.3)
BILIRUBIN TOTAL: 0.7 mg/dL (ref 0.2–1.2)
Total Protein: 7.8 g/dL (ref 6.0–8.3)

## 2016-01-06 LAB — CBC WITH DIFFERENTIAL/PLATELET
BASOS ABS: 0.1 10*3/uL (ref 0.0–0.1)
BASOS PCT: 0.5 % (ref 0.0–3.0)
Eosinophils Absolute: 0.6 10*3/uL (ref 0.0–0.7)
Eosinophils Relative: 5 % (ref 0.0–5.0)
HEMATOCRIT: 45.8 % (ref 39.0–52.0)
Hemoglobin: 15.5 g/dL (ref 13.0–17.0)
LYMPHS PCT: 28.9 % (ref 12.0–46.0)
Lymphs Abs: 3.3 10*3/uL (ref 0.7–4.0)
MCHC: 33.9 g/dL (ref 30.0–36.0)
MCV: 85.6 fl (ref 78.0–100.0)
MONOS PCT: 5.2 % (ref 3.0–12.0)
Monocytes Absolute: 0.6 10*3/uL (ref 0.1–1.0)
NEUTROS ABS: 7 10*3/uL (ref 1.4–7.7)
Neutrophils Relative %: 60.4 % (ref 43.0–77.0)
PLATELETS: 331 10*3/uL (ref 150.0–400.0)
RBC: 5.35 Mil/uL (ref 4.22–5.81)
RDW: 13.5 % (ref 11.5–15.5)
WBC: 11.5 10*3/uL — ABNORMAL HIGH (ref 4.0–10.5)

## 2016-01-06 LAB — BASIC METABOLIC PANEL
BUN: 11 mg/dL (ref 6–23)
CALCIUM: 9.7 mg/dL (ref 8.4–10.5)
CO2: 23 mEq/L (ref 19–32)
Chloride: 102 mEq/L (ref 96–112)
Creatinine, Ser: 0.86 mg/dL (ref 0.40–1.50)
GFR: 114.69 mL/min (ref >60.00)
Glucose, Bld: 91 mg/dL (ref 70–99)
Potassium: 3.9 mEq/L (ref 3.5–5.1)
SODIUM: 135 meq/L (ref 135–145)

## 2016-01-06 LAB — LIPID PANEL
Cholesterol: 187 mg/dL (ref 0–200)
HDL: 26.3 mg/dL — ABNORMAL LOW (ref >39.00)
LDL CALC: 137 mg/dL — AB (ref 0–99)
NONHDL: 160.44
Total CHOL/HDL Ratio: 7
Triglycerides: 117 mg/dL (ref 0.0–149.0)
VLDL: 23.4 mg/dL (ref 0.0–40.0)

## 2016-01-06 LAB — HEMOGLOBIN A1C: HEMOGLOBIN A1C: 5.6 % (ref 4.6–6.5)

## 2016-01-06 LAB — TSH: TSH: 0.57 u[IU]/mL (ref 0.35–4.50)

## 2016-01-06 MED ORDER — LOSARTAN POTASSIUM 100 MG PO TABS: 100.0000 mg | ORAL_TABLET | Freq: Every day | ORAL | Status: DC

## 2016-01-06 MED ORDER — PROMETHAZINE HCL 25 MG PO TABS
25.0000 mg | ORAL_TABLET | Freq: Four times a day (QID) | ORAL | Status: DC | PRN
Start: 2016-01-06 — End: 2016-01-06

## 2016-01-06 MED ORDER — FLUOXETINE HCL 40 MG PO CAPS: ORAL_CAPSULE | ORAL | Status: DC

## 2016-01-06 MED ORDER — PROMETHAZINE HCL 25 MG PO TABS: 25.0000 mg | ORAL_TABLET | Freq: Four times a day (QID) | ORAL | Status: DC | PRN

## 2016-01-06 NOTE — Assessment & Plan Note (Signed)
Pt has recurrent nausea but is now vomiting x3 days.  Last vomited yesterday.  Mother is also vomiting.  Start phenergan prn.  Encouraged increased fluids.  Check CBC to r/o infxn.  Reviewed supportive care and red flags that should prompt return.  Pt expressed understanding and is in agreement w/ plan.

## 2016-01-06 NOTE — Progress Notes (Signed)
Pre visit review using our clinic review tool, if applicable. No additional management support is needed unless otherwise documented below in the visit note. 

## 2016-01-06 NOTE — Progress Notes (Signed)
   Subjective:    Patient ID: Omar Krause, male    DOB: Mar 03, 1991, 25 y.o.   MRN: [id number]  HPI N/V- pt reports nausea x4 days.  Vomiting w/ eating- 'i can only eat a little bit and i have to sit still for 30 minutes'.  No fevers.  + sick contacts.  No diarrhea.  sxs started after eating [employer] burger.  No abd pain.  Pt is concerned about possible DM due to obesity.  Pt is also worried about thyroid issues due to family hx.  Pt has taken phenergan previously but is out.  Last vomited yesterday after eating a salad.  Depression/anxiety- pt ran out of Prozac last month.  Would like to restart.   Review of Systems For ROS see HPI     Objective:   Physical Exam  Constitutional: He appears well-developed and well-nourished. No distress.  Morbidly obese  HENT:  Head: Normocephalic and atraumatic.  Neck: Neck supple.  Cardiovascular: Normal rate, regular rhythm, normal heart sounds and intact distal pulses.   Pulmonary/Chest: Effort normal and breath sounds normal. No respiratory distress. He has no wheezes. He has no rales.  Abdominal: Soft. Bowel sounds are normal. He exhibits no distension. There is no tenderness. There is no rebound and no guarding.  Lymphadenopathy:    He has no cervical adenopathy.  Skin: Skin is warm.  Sweating profusely  Vitals reviewed.         Assessment & Plan:

## 2016-01-06 NOTE — Patient Instructions (Signed)
Schedule your complete physical in 6 months We'll notify you of your lab results and make any changes if needed Restart the Prozac at 1 tab daily x1 month and then increase to 2 tabs daily Use the phenergan as needed for nausea Drink plenty of fluids Please work on healthy diet and regular exercise to possibly prevent diabetes Call with any questions or concerns Hang in there!!

## 2016-01-06 NOTE — Assessment & Plan Note (Signed)
Deteriorated since pt stopped Prozac last month.  Will restart Prozac at 40mg  daily x1 month and increase to 80mg .  Will continue to follow.

## 2016-01-06 NOTE — Assessment & Plan Note (Signed)
Ongoing issue for pt.  He is now worried about possible diabetes and/or thyroid issue.  Stressed need for healthy diet and regular exercise.  Check labs to risk stratify.  Will follow.

## 2016-01-09 ENCOUNTER — Encounter: Payer: Self-pay | Admitting: General Practice

## 2016-03-12 ENCOUNTER — Encounter: Payer: Self-pay | Admitting: Family Medicine

## 2016-03-12 ENCOUNTER — Ambulatory Visit (INDEPENDENT_AMBULATORY_CARE_PROVIDER_SITE_OTHER): Payer: BLUE CROSS/BLUE SHIELD | Admitting: Family Medicine

## 2016-03-12 VITALS — BP 130/84 | HR 100 | Temp 98.0°F | Resp 17 | Ht 72.0 in | Wt 337.2 lb

## 2016-03-12 DIAGNOSIS — F1210 Cannabis abuse, uncomplicated: Secondary | ICD-10-CM

## 2016-03-12 DIAGNOSIS — G4700 Insomnia, unspecified: Secondary | ICD-10-CM | POA: Diagnosis not present

## 2016-03-12 DIAGNOSIS — R112 Nausea with vomiting, unspecified: Secondary | ICD-10-CM

## 2016-03-12 DIAGNOSIS — F1290 Cannabis use, unspecified, uncomplicated: Secondary | ICD-10-CM

## 2016-03-12 MED ORDER — TRAZODONE HCL 50 MG PO TABS: 25.0000 mg | ORAL_TABLET | Freq: Every evening | ORAL | Status: DC | PRN

## 2016-03-12 MED ORDER — ONDANSETRON HCL 4 MG PO TABS: 4.0000 mg | ORAL_TABLET | Freq: Three times a day (TID) | ORAL | Status: DC | PRN

## 2016-03-12 NOTE — Assessment & Plan Note (Signed)
New.  Pt admits to smoking marijuana multiple times daily.  Stressed that he needs to stop smoking ASAP as this is not helping his sleep or his nausea and may actually be worsening his sxs.  Will follow.

## 2016-03-12 NOTE — Progress Notes (Signed)
   Subjective:    Patient ID: Omar Krause, male    DOB: September 24, 1991, 25 y.o.   MRN: [id number]  HPI Insomnia- chronic problem for pt.  Starting ~1 yr ago pt started smoking marijuana nightly to improve insomnia sxs.  A few weeks ago, he attempted to quit smoking 'to make something of myself'.  Since stopping marijuana, sxs are worse.  He has never been on a sleep medication.  Biggest problem is falling asleep.  Once asleep is able to remain asleep.  Nausea- ongoing issue for pt.  Taking Protonix but continues to have nausea.  This worsened when he stopped smoking Marijuana.  Will infrequently have associated vomiting.  Nausea is intermittent- definitely triggered by stress but otherwise 'completely random'.  Pt is smoking marijuana multiple times daily.  Reports nausea predated the marijuana use and states he is aware of cyclical N/V due to marijuana use.   Review of Systems For ROS see HPI     Objective:   Physical Exam  Constitutional: He is oriented to person, place, and time. He appears well-developed and well-nourished. No distress.  obese  HENT:  Head: Normocephalic and atraumatic.  Neck: Neck supple.  Cardiovascular: Normal rate, regular rhythm, normal heart sounds and intact distal pulses.   Pulmonary/Chest: Effort normal and breath sounds normal. No respiratory distress. He has no wheezes. He has no rales.  Abdominal: Soft. Bowel sounds are normal. He exhibits no distension. There is no tenderness. There is no rebound and no guarding.  Lymphadenopathy:    He has no cervical adenopathy.  Neurological: He is alert and oriented to person, place, and time.  Skin: Skin is warm and dry.  Psychiatric: He has a normal mood and affect. His behavior is normal. Thought content normal.  Vitals reviewed.         Assessment & Plan:

## 2016-03-12 NOTE — Progress Notes (Signed)
Pre visit review using our clinic review tool, if applicable. No additional management support is needed unless otherwise documented below in the visit note. 

## 2016-03-12 NOTE — Assessment & Plan Note (Signed)
New to provider.  Ongoing for pt.  Stressed the need for him to stop self medicating w/ drugs and/or alcohol.  Will start Trazodone as I do not want to add a benzo if he is smoking marijuana multiple times a day.  Will follow.

## 2016-03-12 NOTE — Patient Instructions (Signed)
Follow up in 4-6 weeks to recheck insomnia Start the Trazodone nightly for insomnia Use the Zofran as needed for nausea STOP SMOKING MARIJUANA!!! We'll call you with your GI appt for the chronic nausea Call with any questions or concerns Hang in there!!!

## 2016-03-12 NOTE — Assessment & Plan Note (Signed)
Pt reports this has been ongoing for years- predating his marijuana use.  No improvement since starting PPI.  Stress is a definite trigger but otherwise sxs occur randomly.  Start Zofran prn.  Refer to GI for complete evaluation and tx.  Pt expressed understanding and is in agreement w/ plan.

## 2016-04-02 ENCOUNTER — Other Ambulatory Visit: Payer: Self-pay | Admitting: General Practice

## 2016-04-02 MED ORDER — FLUOXETINE HCL 40 MG PO CAPS: ORAL_CAPSULE | ORAL | Status: DC

## 2016-04-02 MED ORDER — BUSPIRONE HCL 15 MG PO TABS: ORAL_TABLET | ORAL | Status: DC

## 2016-04-16 ENCOUNTER — Ambulatory Visit: Payer: BLUE CROSS/BLUE SHIELD | Admitting: Family Medicine

## 2016-05-16 ENCOUNTER — Encounter: Payer: Self-pay | Admitting: Family Medicine

## 2016-05-29 ENCOUNTER — Other Ambulatory Visit: Payer: Self-pay | Admitting: Family Medicine

## 2016-05-31 ENCOUNTER — Other Ambulatory Visit: Payer: Self-pay | Admitting: Family Medicine

## 2016-06-28 ENCOUNTER — Other Ambulatory Visit: Payer: Self-pay | Admitting: Family Medicine

## 2016-06-30 ENCOUNTER — Other Ambulatory Visit: Payer: Self-pay | Admitting: Family Medicine

## 2016-07-23 ENCOUNTER — Other Ambulatory Visit: Payer: Self-pay | Admitting: General Practice

## 2016-07-23 MED ORDER — TRAZODONE HCL 50 MG PO TABS: 25.0000 mg | ORAL_TABLET | Freq: Every evening | ORAL | 0 refills | Status: DC | PRN

## 2016-07-23 NOTE — Telephone Encounter (Signed)
Please advise if ok for pt to have 90 day supply of trazodone?

## 2016-07-23 NOTE — Telephone Encounter (Signed)
Medication filled to pharmacy as requested.   

## 2016-09-04 ENCOUNTER — Other Ambulatory Visit: Payer: Self-pay | Admitting: Family Medicine

## 2016-09-27 ENCOUNTER — Other Ambulatory Visit: Payer: Self-pay | Admitting: Family Medicine

## 2016-11-18 ENCOUNTER — Other Ambulatory Visit: Payer: Self-pay | Admitting: Family Medicine

## 2016-12-29 ENCOUNTER — Other Ambulatory Visit: Payer: Self-pay | Admitting: Family Medicine

## 2017-03-23 ENCOUNTER — Other Ambulatory Visit: Payer: Self-pay | Admitting: Family Medicine

## 2017-09-03 ENCOUNTER — Telehealth: Payer: Self-pay | Admitting: Family Medicine

## 2017-09-03 NOTE — Telephone Encounter (Signed)
Patient would like to switch PCP due to location. Wanted ok from both providers. Please advise.

## 2017-09-03 NOTE — Telephone Encounter (Signed)
Ok with me.  Omar Degreealeb M. Jimmey RalphParker, MD 09/03/2017 4:39 PM

## 2017-09-05 NOTE — Telephone Encounter (Signed)
Ok to switch. Thank you.

## 2017-09-06 ENCOUNTER — Ambulatory Visit: Payer: BLUE CROSS/BLUE SHIELD | Admitting: Family Medicine

## 2017-09-06 ENCOUNTER — Encounter: Payer: Self-pay | Admitting: Family Medicine

## 2017-09-06 VITALS — BP 158/83 | HR 69 | Ht 72.0 in | Wt 309.8 lb

## 2017-09-06 DIAGNOSIS — F411 Generalized anxiety disorder: Secondary | ICD-10-CM

## 2017-09-06 DIAGNOSIS — I10 Essential (primary) hypertension: Secondary | ICD-10-CM

## 2017-09-06 DIAGNOSIS — R739 Hyperglycemia, unspecified: Secondary | ICD-10-CM | POA: Diagnosis not present

## 2017-09-06 DIAGNOSIS — R002 Palpitations: Secondary | ICD-10-CM

## 2017-09-06 MED ORDER — LOSARTAN POTASSIUM 50 MG PO TABS: 50.0000 mg | ORAL_TABLET | Freq: Every day | ORAL | 3 refills | Status: DC

## 2017-09-06 MED ORDER — SERTRALINE HCL 50 MG PO TABS: 50.0000 mg | ORAL_TABLET | Freq: Every day | ORAL | 3 refills | Status: DC

## 2017-09-06 MED ORDER — HYDROXYZINE PAMOATE 100 MG PO CAPS: 100.0000 mg | ORAL_CAPSULE | Freq: Three times a day (TID) | ORAL | 0 refills | Status: DC | PRN

## 2017-09-06 NOTE — Assessment & Plan Note (Signed)
Start sertraline 50 mg daily.  Increase by 50 mg every 1-2 weeks as tolerated.  Also start hydroxyzine as needed for episodes of anxiety.  Return precautions reviewed.  Follow-up in 4 weeks.  Titrate sertraline to goal dose of 200 mg daily.  Discussed with patient that he may need trial of a few different SSRIs before we find one that works for him.

## 2017-09-06 NOTE — Assessment & Plan Note (Signed)
Elevated today.  Restart losartan 50 mg daily.  Follow-up in 3-4 weeks.  Will need repeat BMET at that time.

## 2017-09-06 NOTE — Progress Notes (Signed)
Subjective:  Omar Krause is a 26 y.o. male who presents today with a chief complaint of palpitations.   HPI:  Palpitations, New Issue Started about a week ago.  Patient will have sensations in his chest that lasts for about an hour.  He checks his pulse with his mother's pulse ox.  Reports that they are typically over 100 with highest rate seen in the 170s.  Episodes well last for hours..  No current symptoms.  He has a little bit of discomfort in his chest but no pain.  No shortness of breath.  He feels a little faint with the episodes but denies any syncope.  No clear precipitating triggers.  Events tend to occur randomly.  Anxiety, Chronic issue, worsening Several year history.  Has been on several medications in the past including Prozac and BuSpar.  Does not think that either of these medications worked particularly well.  Is also noticed that he has more frequent anger outbursts over the past few weeks.  Reports that his father has history of depression.  Reports excessive worry and racing thoughts.  Also with increased irritability.  Is interested in both pharmacotherapy and psychotherapy.  No SI or HI.  Depression screen PHQ 2/9 09/06/2017  Decreased Interest 3  Down, Depressed, Hopeless 3  PHQ - 2 Score 6  Altered sleeping 2  Tired, decreased energy 3  Change in appetite 1  Feeling bad or failure about yourself  2  Trouble concentrating 0  Moving slowly or fidgety/restless 3  Suicidal thoughts 0  PHQ-9 Score 17  Difficult doing work/chores Very difficult    GAD 7 : Generalized Anxiety Score 09/06/2017  Nervous, Anxious, on Edge 3  Control/stop worrying 1  Worry too much - different things 3  Trouble relaxing 3  Restless 0  Easily annoyed or irritable 3  Afraid - awful might happen 1  Total GAD 7 Score 14   ROS: No SI or HI.  Hypertension, Chronic Problem, worsening BP Readings from Last 3 Encounters:  09/06/17 (!) 158/83  03/12/16 130/84  01/06/16 140/86    Patient was previously on losartan 100 mg daily.  He ran out of this several months ago and has not been on anything since.  ROS: Denies any chest pain, shortness of breath, dyspnea on exertion, leg edema.   ROS: Per HPI  PMH: Smoking history reviewed. Never smoker.   Objective:  Physical Exam: BP (!) 158/83   Pulse 69   Ht 6' (1.829 m)   Wt (!) 309 lb 12.8 oz (140.5 kg)   SpO2 99%   BMI 42.02 kg/m   Gen: NAD, resting comfortably CV: RRR with no murmurs appreciated Pulm: NWOB, CTAB with no crackles, wheezes, or rhonchi GI: Morbidly Obese, Normal bowel sounds present. Soft, Nontender, Nondistended. MSK: No edema, cyanosis, or clubbing noted Skin: Warm, dry Neuro: Grossly normal, moves all extremities Psych: Normal affect and thought content  EKG: Sinus bradycardia.  No ischemic changes.  Assessment/Plan:  Generalized anxiety disorder Start sertraline 50 mg daily.  Increase by 50 mg every 1-2 weeks as tolerated.  Also start hydroxyzine as needed for episodes of anxiety.  Return precautions reviewed.  Follow-up in 4 weeks.  Titrate sertraline to goal dose of 200 mg daily.  Discussed with patient that he may need trial of a few different SSRIs before we find one that works for him.  Morbid obesity Check lipid panel and A1c.  HTN (hypertension) Elevated today.  Restart losartan 50 mg daily.  Follow-up in 3-4 weeks.  Will need repeat BMET at that time.  Palpitations EKG without clear etiology.  May be related to his underlying anxiety, however needs workup for organic etiology.  Check TSH.  Referral placed to cardiology for ambulatory monitoring to rule out arrhythmia.  Return precautions reviewed.  Preventative healthcare Check lipid panel.  Check A1c.  Katina Degreealeb M. Jimmey RalphParker, MD 09/06/2017 2:35 PM

## 2017-09-06 NOTE — Addendum Note (Signed)
Addended by: Robertt Buda T on: 09/06/2017 02:41 PM   Modules accepted: Orders  

## 2017-09-06 NOTE — Patient Instructions (Signed)
Start sertraline 50 mg daily.  Take this for 1 week.  If you do well without side effects you can increase to 100 mg daily.  If you do well at this dose for 1-2 weeks, you can increase to 150 mg daily.  I will also send in hydroxyzine 100 mg.  You can take this 3 times daily as needed for anxiety.  We will check blood work today.  I will put in a referral to the cardiologist.  Come back to see me in 1 month, or sooner as needed.  Take care, Dr. Jimmey RalphParker

## 2017-09-06 NOTE — Addendum Note (Signed)
Addended by: Garfield Coiner T on: 09/06/2017 02:41 PM   Modules accepted: Orders  

## 2017-09-06 NOTE — Addendum Note (Signed)
Addended by: London SheerFRIZZELL, BAILEY T on: 09/06/2017 02:41 PM   Modules accepted: Orders

## 2017-09-06 NOTE — Assessment & Plan Note (Signed)
Check lipid panel and A1c.

## 2017-09-07 LAB — LIPID PANEL
CHOL/HDL RATIO: 6.5 (calc) — AB (ref <5.0)
Cholesterol: 169 mg/dL (ref <200)
HDL: 26 mg/dL — AB (ref >40)
LDL Cholesterol (Calc): 108 mg/dL (calc) — ABNORMAL HIGH
NON-HDL CHOLESTEROL (CALC): 143 mg/dL — AB (ref <130)
TRIGLYCERIDES: 229 mg/dL — AB (ref <150)

## 2017-09-07 LAB — COMPREHENSIVE METABOLIC PANEL
AG Ratio: 1.7 (calc) (ref 1.0–2.5)
ALT: 39 U/L (ref 9–46)
AST: 29 U/L (ref 10–40)
Albumin: 4.3 g/dL (ref 3.6–5.1)
Alkaline phosphatase (APISO): 59 U/L (ref 40–115)
BUN: 10 mg/dL (ref 7–25)
CO2: 23 mmol/L (ref 20–32)
CREATININE: 0.7 mg/dL (ref 0.60–1.35)
Calcium: 9.4 mg/dL (ref 8.6–10.3)
Chloride: 104 mmol/L (ref 98–110)
GLOBULIN: 2.6 g/dL (ref 1.9–3.7)
GLUCOSE: 125 mg/dL — AB (ref 65–99)
Potassium: 3.8 mmol/L (ref 3.5–5.3)
SODIUM: 136 mmol/L (ref 135–146)
TOTAL PROTEIN: 6.9 g/dL (ref 6.1–8.1)
Total Bilirubin: 0.4 mg/dL (ref 0.2–1.2)

## 2017-09-07 LAB — CBC
HCT: 44.4 % (ref 38.5–50.0)
HEMOGLOBIN: 14.9 g/dL (ref 13.2–17.1)
MCH: 29 pg (ref 27.0–33.0)
MCHC: 33.6 g/dL (ref 32.0–36.0)
MCV: 86.5 fL (ref 80.0–100.0)
MPV: 10.4 fL (ref 7.5–12.5)
Platelets: 308 10*3/uL (ref 140–400)
RBC: 5.13 10*6/uL (ref 4.20–5.80)
RDW: 12.9 % (ref 11.0–15.0)
WBC: 10.4 10*3/uL (ref 3.8–10.8)

## 2017-09-07 LAB — HEMOGLOBIN A1C
Hgb A1c MFr Bld: 5.3 % of total Hgb (ref <5.7)
Mean Plasma Glucose: 105 (calc)
eAG (mmol/L): 5.8 (calc)

## 2017-09-07 LAB — TSH: TSH: 2.36 mIU/L (ref 0.40–4.50)

## 2017-09-09 NOTE — Progress Notes (Signed)
Patient's cholesterol mildly elevated - no need to start meds, but he should work on diet and exercise. His other labs are normal.  Katina Degreealeb M. Jimmey RalphParker, MD 09/09/2017 8:19 AM

## 2017-09-10 ENCOUNTER — Encounter: Payer: Self-pay | Admitting: Family Medicine

## 2017-10-02 ENCOUNTER — Encounter: Payer: Self-pay | Admitting: Cardiovascular Disease

## 2017-10-05 ENCOUNTER — Other Ambulatory Visit: Payer: Self-pay | Admitting: Family Medicine

## 2017-10-07 ENCOUNTER — Ambulatory Visit: Payer: BLUE CROSS/BLUE SHIELD | Admitting: Family Medicine

## 2017-10-07 MED ORDER — HYDROXYZINE PAMOATE 100 MG PO CAPS: 100.0000 mg | ORAL_CAPSULE | Freq: Three times a day (TID) | ORAL | 0 refills | Status: DC | PRN

## 2017-10-14 ENCOUNTER — Other Ambulatory Visit: Payer: Self-pay

## 2017-10-14 MED ORDER — SERTRALINE HCL 50 MG PO TABS: 50.0000 mg | ORAL_TABLET | Freq: Every day | ORAL | 1 refills | Status: DC

## 2018-01-31 ENCOUNTER — Encounter (HOSPITAL_COMMUNITY): Payer: Self-pay | Admitting: Emergency Medicine

## 2018-01-31 ENCOUNTER — Emergency Department (HOSPITAL_COMMUNITY)
Admission: EM | Admit: 2018-01-31 | Discharge: 2018-01-31 | Disposition: A | Discharge status: Home / Self Care | Payer: Self-pay | Attending: Emergency Medicine | Admitting: Emergency Medicine

## 2018-01-31 ENCOUNTER — Emergency Department (HOSPITAL_COMMUNITY): Payer: Self-pay

## 2018-01-31 ENCOUNTER — Other Ambulatory Visit: Payer: Self-pay

## 2018-01-31 DIAGNOSIS — I10 Essential (primary) hypertension: Secondary | ICD-10-CM | POA: Insufficient documentation

## 2018-01-31 DIAGNOSIS — J45909 Unspecified asthma, uncomplicated: Secondary | ICD-10-CM | POA: Insufficient documentation

## 2018-01-31 DIAGNOSIS — R0602 Shortness of breath: Secondary | ICD-10-CM | POA: Insufficient documentation

## 2018-01-31 DIAGNOSIS — F419 Anxiety disorder, unspecified: Secondary | ICD-10-CM | POA: Insufficient documentation

## 2018-01-31 DIAGNOSIS — R0789 Other chest pain: Secondary | ICD-10-CM | POA: Insufficient documentation

## 2018-01-31 DIAGNOSIS — Z79899 Other long term (current) drug therapy: Secondary | ICD-10-CM | POA: Insufficient documentation

## 2018-01-31 DIAGNOSIS — R112 Nausea with vomiting, unspecified: Secondary | ICD-10-CM | POA: Insufficient documentation

## 2018-01-31 HISTORY — DX: Anxiety disorder, unspecified: F41.9

## 2018-01-31 HISTORY — DX: Palpitations: R00.2

## 2018-01-31 LAB — CBC WITH DIFFERENTIAL/PLATELET
Basophils Absolute: 0 10*3/uL (ref 0.0–0.1)
Basophils Relative: 0 %
EOS ABS: 0.2 10*3/uL (ref 0.0–0.7)
EOS PCT: 2 %
HCT: 43.3 % (ref 39.0–52.0)
HEMOGLOBIN: 14.5 g/dL (ref 13.0–17.0)
Lymphocytes Relative: 33 %
Lymphs Abs: 3.6 10*3/uL (ref 0.7–4.0)
MCH: 28.9 pg (ref 26.0–34.0)
MCHC: 33.5 g/dL (ref 30.0–36.0)
MCV: 86.4 fL (ref 78.0–100.0)
MONO ABS: 0.6 10*3/uL (ref 0.1–1.0)
MONOS PCT: 5 %
Neutro Abs: 6.8 10*3/uL (ref 1.7–7.7)
Neutrophils Relative %: 60 %
PLATELETS: 308 10*3/uL (ref 150–400)
RBC: 5.01 MIL/uL (ref 4.22–5.81)
RDW: 13.8 % (ref 11.5–15.5)
WBC: 11.2 10*3/uL — ABNORMAL HIGH (ref 4.0–10.5)

## 2018-01-31 LAB — COMPREHENSIVE METABOLIC PANEL
ALK PHOS: 66 U/L (ref 38–126)
ALT: 35 U/L (ref 17–63)
ANION GAP: 12 (ref 5–15)
AST: 26 U/L (ref 15–41)
Albumin: 4.4 g/dL (ref 3.5–5.0)
BILIRUBIN TOTAL: 0.4 mg/dL (ref 0.3–1.2)
BUN: 11 mg/dL (ref 6–20)
CALCIUM: 9.2 mg/dL (ref 8.9–10.3)
CO2: 22 mmol/L (ref 22–32)
CREATININE: 0.9 mg/dL (ref 0.61–1.24)
Chloride: 106 mmol/L (ref 101–111)
GFR calc Af Amer: >60 mL/min (ref >60)
GFR calc non Af Amer: >60 mL/min (ref >60)
GLUCOSE: 141 mg/dL — AB (ref 65–99)
Potassium: 3.5 mmol/L (ref 3.5–5.1)
Sodium: 140 mmol/L (ref 135–145)
Total Protein: 7.5 g/dL (ref 6.5–8.1)

## 2018-01-31 LAB — D-DIMER, QUANTITATIVE (NOT AT ARMC)

## 2018-01-31 LAB — LIPASE, BLOOD: Lipase: 26 U/L (ref 11–51)

## 2018-01-31 LAB — TROPONIN I: Troponin I: <0.03 ng/mL (ref <0.03)

## 2018-01-31 MED ORDER — IBUPROFEN 400 MG PO TABS
400.0000 mg | ORAL_TABLET | Freq: Once | ORAL | Status: AC
  Administered 2018-01-31: 400 mg via ORAL
  Filled 2018-01-31: qty 1

## 2018-01-31 MED ORDER — ACETAMINOPHEN 325 MG PO TABS
650.0000 mg | ORAL_TABLET | Freq: Once | ORAL | Status: AC
  Administered 2018-01-31: 650 mg via ORAL
  Filled 2018-01-31: qty 2

## 2018-01-31 MED ORDER — IPRATROPIUM-ALBUTEROL 0.5-2.5 (3) MG/3ML IN SOLN: 3.0000 mL | Freq: Once | RESPIRATORY_TRACT | Status: DC

## 2018-01-31 MED ORDER — NAPROXEN 250 MG PO TABS: 250.0000 mg | ORAL_TABLET | Freq: Two times a day (BID) | ORAL | 0 refills | Status: DC | PRN

## 2018-01-31 MED ORDER — ONDANSETRON 4 MG PO TBDP: 4.0000 mg | ORAL_TABLET | Freq: Three times a day (TID) | ORAL | 0 refills | Status: DC | PRN

## 2018-01-31 NOTE — ED Provider Notes (Signed)
[facility] EMERGENCY DEPARTMENT Provider Note   CSN: [id number] Arrival date & time: 01/31/18  1316     History   Chief Complaint Chief Complaint  Patient presents with  . Chest Pain    HPI Omar Krause is a 27 y.o. male.  HPI  Pt was seen at 1330. Per pt, c/o gradual onset and persistence of constant multiple symptoms for the past 1 month. Pt's symptoms include constant chest "pain" and SOB. Describes the pain as "aching," waxes and wanes but is constantly present. SOB has been intermittent. Pt states for the past 3 days he has had several intermittent episodes of N/V. Endorses significant hx of anxiety. Denies diarrhea, no palpitations, no cough, no abd pain, no black or blood in stools or emesis, no fevers, no rash, no back pain.    Past Medical History:  Diagnosis Date  . Anxiety   . Asthma    No inhaler at home, has not had issues since he was child  . Depression   . Hypertension   . Morbid obesity (HCC)   . Nausea   . Palpitations     Patient Active Problem List   Diagnosis Date Noted  . Insomnia 03/12/2016  . Marijuana use 03/12/2016  . Asthma with acute exacerbation 09/14/2015  . HTN (hypertension) 02/02/2014  . Generalized anxiety disorder 02/02/2014  . Morbid obesity (HCC) 07/08/2013    Past Surgical History:  Procedure Laterality Date  . TONSILLECTOMY AND ADENOIDECTOMY          Home Medications    Prior to Admission medications   Medication Sig Start Date End Date Taking? Authorizing Provider  aspirin 325 MG tablet Take 650 mg by mouth every 6 (six) hours as needed for mild pain or headache.   Yes [provider]  Multiple Vitamin (MULTIVITAMIN WITH MINERALS) TABS tablet Take 1 tablet by mouth daily.   Yes [provider]  losartan (COZAAR) 50 MG tablet Take 1 tablet (50 mg total) daily by mouth. Patient not taking: Reported on 01/31/2018 09/06/17   Ardith Dark, MD  sertraline (ZOLOFT) 50 MG tablet Take 1 tablet (50 mg  total) by mouth daily. Patient not taking: Reported on 01/31/2018 10/14/17   Ardith Dark, MD    Family History Family History  Problem Relation Age of Onset  . Arthritis Mother   . Diabetes Mother   . Alcohol abuse Father   . Arthritis Father   . Hyperlipidemia Father   . Stroke Father   . Hypertension Father   . Mental illness Father   . Diabetes Father   . Arthritis Maternal Grandmother   . Stroke Maternal Grandmother   . Diabetes Maternal Grandmother   . Arthritis Maternal Grandfather   . Heart attack Maternal Grandfather 50  . Diabetes Maternal Grandfather     Social History Social History   Tobacco Use  . Smoking status: Never Smoker  . Smokeless tobacco: Never Used  Substance Use Topics  . Alcohol use: No    Alcohol/week: 0.5 oz    Types: 1 Standard drinks or equivalent per week    Frequency: Never  . Drug use: Yes    Types: Marijuana    Comment: smoked 3 days ago.      Allergies   Ceftin [cefuroxime axetil]   Review of Systems Review of Systems ROS: Statement: All systems negative except as marked or noted in the HPI; Constitutional: Negative for fever and chills. ; ; Eyes: Negative for eye pain,  redness and discharge. ; ; ENMT: Negative for ear pain, hoarseness, nasal congestion, sinus pressure and sore throat. ; ; Cardiovascular: Negative for palpitations, diaphoresis, and peripheral edema. ; ; Respiratory: +SOB. Negative for cough, wheezing and stridor. ; ; Gastrointestinal: +N/V. Negative for diarrhea, abdominal pain, blood in stool, hematemesis, jaundice and rectal bleeding. . ; ; Genitourinary: Negative for dysuria, flank pain and hematuria. ; ; Musculoskeletal: +chest wall pain. Negative for back pain and neck pain. Negative for swelling and trauma.; ; Skin: Negative for pruritus, rash, abrasions, blisters, bruising and skin lesion.; ; Neuro: Negative for headache, lightheadedness and neck stiffness. Negative for weakness, altered level of  consciousness, altered mental status, extremity weakness, paresthesias, involuntary movement, seizure and syncope.     Physical Exam Updated Vital Signs BP (!) 178/85   Pulse 78   Temp 98.6 F (37 C) (Oral)   Resp (!) 22   Ht 6' (1.829 m)   Wt 136.1 kg (300 lb)   SpO2 97%   BMI 40.69 kg/m    BP (!) 150/71   Pulse 94   Temp 98.6 F (37 C) (Oral)   Resp 19   Ht 6' (1.829 m)   Wt 136.1 kg (300 lb)   SpO2 97%   BMI 40.69 kg/m    Physical Exam 1335: Physical examination:  Nursing notes reviewed; Vital signs and O2 SAT reviewed;  Constitutional: Well developed, Well nourished, Well hydrated, In no acute distress; Head:  Normocephalic, atraumatic; Eyes: EOMI, PERRL, No scleral icterus; ENMT: Mouth and pharynx normal, Mucous membranes moist; Neck: Supple, Full range of motion, No lymphadenopathy; Cardiovascular: Regular rate and rhythm, No gallop; Respiratory: Breath sounds clear & equal bilaterally, No wheezes.  Speaking full sentences with ease, Normal respiratory effort/excursion; Chest: No deformity, no soft tissue crepitus. Movement normal; Abdomen: Soft, Nontender, Nondistended, Normal bowel sounds; Genitourinary: No CVA tenderness; Extremities: Peripheral pulses normal, No tenderness, No edema, No calf edema or asymmetry.; Neuro: AA&Ox3, Major CN grossly intact.  Speech clear. No gross focal motor or sensory deficits in extremities.; Skin: Color normal, Warm, Dry.; Psych:  Anxious.    ED Treatments / Results  Labs (all labs ordered are listed, but only abnormal results are displayed)   EKG EKG Interpretation  Date/Time:  Friday January 31 2018 13:21:55 EDT Ventricular Rate:  81 PR Interval:  162 QRS Duration: 90 QT Interval:  350 QTC Calculation: 406 R Axis:   84 Text Interpretation:  Normal sinus rhythm Normal ECG When compared with ECG of 05/02/2014 No significant change was found Confirmed by Samuel JesterMcManus, Perina Salvaggio [phone]) on 01/31/2018 1:41:24  PM   Radiology   Procedures Procedures (including critical care time)  Medications Ordered in ED Medications - No data to display   Initial Impression / Assessment and Plan / ED Course  I have reviewed the triage vital signs and the nursing notes.  Pertinent labs & imaging results that were available during my care of the patient were reviewed by me and considered in my medical decision making (see chart for details).  MDM Reviewed: previous chart, nursing note and vitals Reviewed previous: labs and ECG Interpretation: labs, ECG and x-ray   Results for orders placed or performed during the hospital encounter of 01/31/18  Troponin I  Result Value Ref Range   Troponin I <0.03 <0.03 ng/mL  Comprehensive metabolic panel  Result Value Ref Range   Sodium 140 135 - 145 mmol/L   Potassium 3.5 3.5 - 5.1 mmol/L   Chloride 106 101 - 111  mmol/L   CO2 22 22 - 32 mmol/L   Glucose, Bld 141 (H) 65 - 99 mg/dL   BUN 11 6 - 20 mg/dL   Creatinine, Ser 1.61 0.61 - 1.24 mg/dL   Calcium 9.2 8.9 - 09.6 mg/dL   Total Protein 7.5 6.5 - 8.1 g/dL   Albumin 4.4 3.5 - 5.0 g/dL   AST 26 15 - 41 U/L   ALT 35 17 - 63 U/L   Alkaline Phosphatase 66 38 - 126 U/L   Total Bilirubin 0.4 0.3 - 1.2 mg/dL   GFR calc non Af Amer >60 >60 mL/min   GFR calc Af Amer >60 >60 mL/min   Anion gap 12 5 - 15  Lipase, blood  Result Value Ref Range   Lipase 26 11 - 51 U/L  CBC with Differential  Result Value Ref Range   WBC 11.2 (H) 4.0 - 10.5 K/uL   RBC 5.01 4.22 - 5.81 MIL/uL   Hemoglobin 14.5 13.0 - 17.0 g/dL   HCT 04.5 40.9 - 81.1 %   MCV 86.4 78.0 - 100.0 fL   MCH 28.9 26.0 - 34.0 pg   MCHC 33.5 30.0 - 36.0 g/dL   RDW 91.4 78.2 - 95.6 %   Platelets 308 150 - 400 K/uL   Neutrophils Relative % 60 %   Neutro Abs 6.8 1.7 - 7.7 K/uL   Lymphocytes Relative 33 %   Lymphs Abs 3.6 0.7 - 4.0 K/uL   Monocytes Relative 5 %   Monocytes Absolute 0.6 0.1 - 1.0 K/uL   Eosinophils Relative 2 %   Eosinophils  Absolute 0.2 0.0 - 0.7 K/uL   Basophils Relative 0 %   Basophils Absolute 0.0 0.0 - 0.1 K/uL  D-dimer, quantitative  Result Value Ref Range   D-Dimer, Quant <0.27 0.00 - 0.50 ug/mL-FEU   Dg Chest 2 View Result Date: 01/31/2018 CLINICAL DATA:  Chest pain and tightness with cough and shortness of breath. History of asthma. EXAM: CHEST - 2 VIEW COMPARISON:  Chest x-ray dated September 08, 2015. FINDINGS: The heart size and mediastinal contours are within normal limits. Both lungs are clear. The visualized skeletal structures are unremarkable. IMPRESSION: No active cardiopulmonary disease. Electronically Signed   By: Obie Dredge M.D.   On: 01/31/2018 14:24    1500:  Pt has tol PO well while in the ED without N/V.  No stooling while in the ED.  Abd remains benign, VSS. Feels better and wants to go home now. Doubt PE as cause for symptoms with normal d-dimer and low risk [place].  Doubt ACS as cause for symptoms with normal troponin and unchanged EKG from previous after 1 month of constant atypical symptoms. Tx symptomatically at this time. Dx and testing d/w pt.  Questions answered.  Verb understanding, agreeable to d/c home with outpt f/u.     Final Clinical Impressions(s) / ED Diagnoses   Final diagnoses:  None    ED Discharge Orders    None       Samuel Jester, DO 02/02/18 0802

## 2018-01-31 NOTE — ED Triage Notes (Signed)
Pt c/o of left chest pain radiating to left arm starting 3 days ago.  N/v starting today.

## 2018-01-31 NOTE — ED Notes (Signed)
Dr McM in to assess 

## 2018-01-31 NOTE — Discharge Instructions (Addendum)
Take the prescriptions as directed. Also take over the counter tylenol, as directed on packaging, as needed for discomfort.  Increase your fluid intake (ie:  Gatoraide) for the next few days.  Eat a bland diet and advance to your regular diet slowly as you can tolerate it. Apply moist heat or ice to the area(s) of discomfort, for 15 minutes at a time, several times per day for the next few days.  Do not fall asleep on a heating or ice pack.  Call your regular medical doctor today to schedule a follow up appointment within the next week.  Return to the Emergency Department immediately if worsening.

## 2018-01-31 NOTE — ED Notes (Signed)
Pt reports walking his dog today and having chest pain- reports CP x 1 month- has no insurance and has not been evaluated for same  He reports a history of anxiety and is currently unemployed  Does not smoke cigarettes, but doe use marijuana occas.  Was walking his dog when CP began

## 2018-01-31 NOTE — ED Notes (Signed)
Drinking water, eating nabs

## 2018-03-22 ENCOUNTER — Emergency Department (HOSPITAL_COMMUNITY)
Admission: EM | Admit: 2018-03-22 | Discharge: 2018-03-22 | Disposition: A | Discharge status: Home / Self Care | Payer: BLUE CROSS/BLUE SHIELD | Attending: Emergency Medicine | Admitting: Emergency Medicine

## 2018-03-22 ENCOUNTER — Other Ambulatory Visit: Payer: Self-pay

## 2018-03-22 ENCOUNTER — Emergency Department (HOSPITAL_COMMUNITY): Payer: BLUE CROSS/BLUE SHIELD

## 2018-03-22 ENCOUNTER — Encounter (HOSPITAL_COMMUNITY): Payer: Self-pay | Admitting: Emergency Medicine

## 2018-03-22 DIAGNOSIS — Z79899 Other long term (current) drug therapy: Secondary | ICD-10-CM | POA: Insufficient documentation

## 2018-03-22 DIAGNOSIS — J45909 Unspecified asthma, uncomplicated: Secondary | ICD-10-CM | POA: Insufficient documentation

## 2018-03-22 DIAGNOSIS — Y929 Unspecified place or not applicable: Secondary | ICD-10-CM | POA: Insufficient documentation

## 2018-03-22 DIAGNOSIS — Y999 Unspecified external cause status: Secondary | ICD-10-CM | POA: Insufficient documentation

## 2018-03-22 DIAGNOSIS — I10 Essential (primary) hypertension: Secondary | ICD-10-CM | POA: Diagnosis not present

## 2018-03-22 DIAGNOSIS — M25571 Pain in right ankle and joints of right foot: Secondary | ICD-10-CM | POA: Diagnosis not present

## 2018-03-22 DIAGNOSIS — S93401A Sprain of unspecified ligament of right ankle, initial encounter: Secondary | ICD-10-CM | POA: Diagnosis not present

## 2018-03-22 DIAGNOSIS — M25471 Effusion, right ankle: Secondary | ICD-10-CM | POA: Diagnosis not present

## 2018-03-22 DIAGNOSIS — Y9301 Activity, walking, marching and hiking: Secondary | ICD-10-CM | POA: Diagnosis not present

## 2018-03-22 DIAGNOSIS — W010XXA Fall on same level from slipping, tripping and stumbling without subsequent striking against object, initial encounter: Secondary | ICD-10-CM | POA: Insufficient documentation

## 2018-03-22 MED ORDER — IBUPROFEN 800 MG PO TABS: 800.0000 mg | ORAL_TABLET | Freq: Three times a day (TID) | ORAL | 0 refills | Status: DC

## 2018-03-22 NOTE — ED Triage Notes (Signed)
Patient states he slipped on wet grass and rolled his R ankle.

## 2018-03-22 NOTE — ED Notes (Signed)
To rad 

## 2018-03-22 NOTE — Discharge Instructions (Addendum)
Elevate your leg when possible.  Use crutches for weightbearing for at least 1 week.  Apply ice packs on and off to your ankle.  Call Dr. Mort SawyersHarrison's office to arrange a follow-up appointment in 1 week if not improving

## 2018-03-23 NOTE — ED Provider Notes (Signed)
[facility] EMERGENCY DEPARTMENT Provider Note   CSN: [id number] Arrival date & time: 03/22/18  1137     History   Chief Complaint Chief Complaint  Patient presents with  . Ankle Pain    HPI Rose PhiFurman Antu III is a 27 y.o. male.  HPI   Rose PhiFurman Geeslin III is a 27 y.o. male who presents to the Emergency Department complaining of right ankle pain and swelling after a twisting injury when he fell in wet grass.  He reports pain associated with movement and weightbearing.  He has applied ice without relief.  He describes a constant throbbing pain.  He denies other injuries, numbness, weakness of the extremity, or open wound.   Past Medical History:  Diagnosis Date  . Anxiety   . Asthma    No inhaler at home, has not had issues since he was child  . Depression   . Hypertension   . Morbid obesity (HCC)   . Nausea   . Palpitations     Patient Active Problem List   Diagnosis Date Noted  . Insomnia 03/12/2016  . Marijuana use 03/12/2016  . Asthma with acute exacerbation 09/14/2015  . HTN (hypertension) 02/02/2014  . Generalized anxiety disorder 02/02/2014  . Morbid obesity (HCC) 07/08/2013    Past Surgical History:  Procedure Laterality Date  . TONSILLECTOMY AND ADENOIDECTOMY          Home Medications    Prior to Admission medications   Medication Sig Start Date End Date Taking? Authorizing Provider  Multiple Vitamin (MULTIVITAMIN WITH MINERALS) TABS tablet Take 1 tablet by mouth daily.   Yes [provider]  aspirin 325 MG tablet Take 650 mg by mouth every 6 (six) hours as needed for mild pain or headache.    [provider]  ibuprofen (ADVIL,MOTRIN) 800 MG tablet Take 1 tablet (800 mg total) by mouth 3 (three) times daily. 03/22/18   Talma Aguillard, PA-C  losartan (COZAAR) 50 MG tablet Take 1 tablet (50 mg total) daily by mouth. Patient not taking: Reported on 01/31/2018 09/06/17   Ardith DarkParker, Caleb M, MD  ondansetron (ZOFRAN ODT) 4 MG disintegrating tablet  Take 1 tablet (4 mg total) by mouth every 8 (eight) hours as needed for nausea or vomiting. Patient not taking: Reported on 03/22/2018 01/31/18   Samuel JesterMcManus, Kathleen, DO  sertraline (ZOLOFT) 50 MG tablet Take 1 tablet (50 mg total) by mouth daily. Patient not taking: Reported on 01/31/2018 10/14/17   Ardith DarkParker, Caleb M, MD    Family History Family History  Problem Relation Age of Onset  . Arthritis Mother   . Diabetes Mother   . Alcohol abuse Father   . Arthritis Father   . Hyperlipidemia Father   . Stroke Father   . Hypertension Father   . Mental illness Father   . Diabetes Father   . Arthritis Maternal Grandmother   . Stroke Maternal Grandmother   . Diabetes Maternal Grandmother   . Arthritis Maternal Grandfather   . Heart attack Maternal Grandfather 50  . Diabetes Maternal Grandfather     Social History Social History   Tobacco Use  . Smoking status: Never Smoker  . Smokeless tobacco: Never Used  Substance Use Topics  . Alcohol use: No    Alcohol/week: 0.5 oz    Types: 1 Standard drinks or equivalent per week    Frequency: Never  . Drug use: Yes    Types: Marijuana    Comment: smoked 3 days ago.  Allergies   Ceftin [cefuroxime axetil]   Review of Systems Review of Systems  Constitutional: Negative for chills and fever.  Gastrointestinal: Negative for nausea and vomiting.  Musculoskeletal: Positive for arthralgias (Right ankle pain and swelling) and joint swelling.  Skin: Negative for color change and wound.  Neurological: Negative for weakness and numbness.  All other systems reviewed and are negative.    Physical Exam Updated Vital Signs BP (!) 171/84 (BP Location: Right Arm)   Pulse 82   Temp 98.1 F (36.7 C) (Oral)   Resp 18   Ht 6' (1.829 m)   Wt (!) 144.2 kg (318 lb)   SpO2 100%   BMI 43.13 kg/m   Physical Exam  Constitutional: He is oriented to person, place, and time. He appears well-developed and well-nourished. No distress.  HENT:    Head: Atraumatic.  Mouth/Throat: Oropharynx is clear and moist.  Neck: Normal range of motion.  Cardiovascular: Normal rate, regular rhythm and intact distal pulses.  Pulmonary/Chest: Effort normal and breath sounds normal. No respiratory distress.  Musculoskeletal: He exhibits edema and tenderness. He exhibits no deformity.  Diffuse tender to palpation of the medial and lateral right ankle.  Moderate edema noted.  No bony deformity.  No proximal tenderness.  Compartments are soft.  Neurological: He is oriented to person, place, and time. No sensory deficit.  Skin: Skin is warm. Capillary refill takes less than 2 seconds.  Nursing note and vitals reviewed.    ED Treatments / Results  Labs (all labs ordered are listed, but only abnormal results are displayed) Labs Reviewed - No data to display  EKG None  Radiology Dg Ankle Complete Right  Result Date: 03/22/2018 CLINICAL DATA:  27 year old who slipped on wet grass outside and sustained an inversion injury to the RIGHT ankle. Diffuse ankle pain. Initial encounter. EXAM: RIGHT ANKLE - COMPLETE 3+ VIEW COMPARISON:  None. FINDINGS: LATERAL soft tissue swelling. No evidence of acute fracture or dislocation. Ankle mortise well-preserved. Well-preserved joint space. Well-preserved bone mineral density. Large joint effusion. IMPRESSION: 1. No osseous abnormality. 2. Large joint effusion. Electronically Signed   By: Hulan Saas M.D.   On: 03/22/2018 12:17    Procedures Procedures (including critical care time)  Medications Ordered in ED Medications - No data to display   Initial Impression / Assessment and Plan / ED Course  I have reviewed the triage vital signs and the nursing notes.  Pertinent labs & imaging results that were available during my care of the patient were reviewed by me and considered in my medical decision making (see chart for details).     X-ray reviewed and negative for fracture.  Likely sprain.   Neurovascularly intact.  ASO brace applied and crutches given.  Patient agrees to RICE therapy and orthopedic follow-up in 1 week if not improving.  Final Clinical Impressions(s) / ED Diagnoses   Final diagnoses:  Sprain of right ankle, unspecified ligament, initial encounter    ED Discharge Orders        Ordered    ibuprofen (ADVIL,MOTRIN) 800 MG tablet  3 times daily     03/22/18 1237       Pauline Aus, PA-C 03/23/18 1019    Samuel Jester, DO 03/23/18 1123

## 2018-03-29 ENCOUNTER — Other Ambulatory Visit: Payer: Self-pay

## 2018-03-29 ENCOUNTER — Encounter (HOSPITAL_COMMUNITY): Payer: Self-pay | Admitting: *Deleted

## 2018-03-29 ENCOUNTER — Ambulatory Visit (HOSPITAL_COMMUNITY)
Admission: EM | Admit: 2018-03-29 | Discharge: 2018-03-29 | Disposition: A | Discharge status: Home / Self Care | Payer: BLUE CROSS/BLUE SHIELD | Attending: Internal Medicine | Admitting: Internal Medicine

## 2018-03-29 DIAGNOSIS — M25511 Pain in right shoulder: Secondary | ICD-10-CM | POA: Diagnosis not present

## 2018-03-29 DIAGNOSIS — R110 Nausea: Secondary | ICD-10-CM

## 2018-03-29 MED ORDER — ONDANSETRON 8 MG PO TBDP: 8.0000 mg | ORAL_TABLET | Freq: Three times a day (TID) | ORAL | 0 refills | Status: DC | PRN

## 2018-03-29 MED ORDER — ONDANSETRON 4 MG PO TBDP
8.0000 mg | ORAL_TABLET | Freq: Once | ORAL | Status: AC
  Administered 2018-03-29: 8 mg via ORAL

## 2018-03-29 MED ORDER — ONDANSETRON 4 MG PO TBDP
ORAL_TABLET | ORAL | Status: AC
  Filled 2018-03-29: qty 2

## 2018-03-29 NOTE — ED Provider Notes (Signed)
[facility]    CSN: [id number] Arrival date & time: 03/29/18  1247     History   Chief Complaint Chief Complaint  Patient presents with  . Nausea  . Shoulder Pain    HPI Omar Krause is a 27 y.o. male.   Patient was last seem on 03/22/18 in the ER for ankle pain secondary to a fall without a fracture. Patient was send home to use ibuprofen for pain relief. Patient developed nausea the day after his ER visit and started dry heaving yesterday. He endorses diarrhea but diarrhea is chronic for him and has not changed. He also developed a left shoulder pain after leaving the ER, he believes the shoulder pain is from the injury.  He denies head injury during his fall. He had no LOC from the fall. He denies fever or blood in stool. No SOB, CP, dizziness or headache. Does have some epigastric abd pain.   Patient has appointment scheduled with PCP on Monday but came in today because his nausea became very uncomfortable.   Noted this BP to be elevated, he reports not taking any BP medications. He reports to have white coat syndrome, reports normal reading at home.      Past Medical History:  Diagnosis Date  . Anxiety   . Asthma    No inhaler at home, has not had issues since he was child  . Depression   . Hypertension   . Morbid obesity (HCC)   . Nausea   . Palpitations     Patient Active Problem List   Diagnosis Date Noted  . Insomnia 03/12/2016  . Marijuana use 03/12/2016  . Asthma with acute exacerbation 09/14/2015  . HTN (hypertension) 02/02/2014  . Generalized anxiety disorder 02/02/2014  . Morbid obesity (HCC) 07/08/2013    Past Surgical History:  Procedure Laterality Date  . CIRCUMCISION    . TONSILLECTOMY AND ADENOIDECTOMY         Home Medications    Prior to Admission medications   Medication Sig Start Date End Date Taking? Authorizing Provider  ibuprofen (ADVIL,MOTRIN) 800 MG tablet Take 1 tablet (800 mg total) by mouth 3 (three) times  daily. 03/22/18  Yes Triplett, Tammy, PA-C  Multiple Vitamin (MULTIVITAMIN WITH MINERALS) TABS tablet Take 1 tablet by mouth daily.   Yes [provider]  ondansetron (ZOFRAN-ODT) 8 MG disintegrating tablet Take 1 tablet (8 mg total) by mouth every 8 (eight) hours as needed for nausea or vomiting. 03/29/18   Lucia Estelle, NP    Family History Family History  Problem Relation Age of Onset  . Arthritis Mother   . Diabetes Mother   . Alcohol abuse Father   . Arthritis Father   . Hyperlipidemia Father   . Stroke Father   . Hypertension Father   . Mental illness Father   . Diabetes Father   . Arthritis Maternal Grandmother   . Stroke Maternal Grandmother   . Diabetes Maternal Grandmother   . Arthritis Maternal Grandfather   . Heart attack Maternal Grandfather 50  . Diabetes Maternal Grandfather     Social History Social History   Tobacco Use  . Smoking status: Never Smoker  . Smokeless tobacco: Never Used  Substance Use Topics  . Alcohol use: No    Frequency: Never  . Drug use: Yes    Types: Marijuana     Allergies   Ceftin [cefuroxime axetil]   Review of Systems Review of Systems  Constitutional:  As stated in the HPI     Physical Exam Triage Vital Signs ED Triage Vitals  Enc Vitals Group     BP 03/29/18 1341 (!) 177/70     Pulse Rate 03/29/18 1341 (!) 58     Resp 03/29/18 1341 16     Temp 03/29/18 1341 98.3 F (36.8 C)     Temp Source 03/29/18 1341 Oral     SpO2 03/29/18 1341 100 %     Weight --      Height --      Head Circumference --      Peak Flow --      Pain Score 03/29/18 1342 3     Pain Loc --      Pain Edu? --      Excl. in GC? --    No data found.  Updated Vital Signs BP (!) 177/70   Pulse (!) 58   Temp 98.3 F (36.8 C) (Oral)   Resp 16   SpO2 100%   Physical Exam  Constitutional: He is oriented to person, place, and time. He appears well-developed and well-nourished.  HENT:  Head: Normocephalic and atraumatic.  Eyes:  Pupils are equal, round, and reactive to light. Conjunctivae are normal.  Neck: Normal range of motion. Neck supple.  Cardiovascular: Normal rate, regular rhythm and normal heart sounds.  No murmur heard. Pulmonary/Chest: Effort normal and breath sounds normal. He has no wheezes.  Abdominal: Soft. Bowel sounds are normal. There is tenderness (Generalized tenderness present).  Musculoskeletal:  Left shoulder has full ROM. Sore to palpate. Sensation intact. Strength is strong.   Neurological: He is alert and oriented to person, place, and time.  Skin: Skin is warm and dry.  Nursing note and vitals reviewed.    UC Treatments / Results  Labs (all labs ordered are listed, but only abnormal results are displayed) Labs Reviewed - No data to display  EKG None  Radiology No results found.  Procedures Procedures (including critical care time)  Medications Ordered in UC Medications  ondansetron (ZOFRAN-ODT) disintegrating tablet 8 mg (8 mg Oral Given 03/29/18 1442)    Initial Impression / Assessment and Plan / UC Course  I have reviewed the triage vital signs and the nursing notes.  Pertinent labs & imaging results that were available during my care of the patient were reviewed by me and considered in my medical decision making (see chart for details).  Final Clinical Impressions(s) / UC Diagnoses   Final diagnoses:  Nausea  Acute pain of right shoulder   No obvious etiology for nausea and dry heaving. Physical exam was benign. Rx for zofran given to help with nausea. His appetite has unchanged, he is tolerating food like his usual.  He has appointment scheduled with PCP on Monday, please f/u accordingly for further work up.   Acute pain of right shoulder is most likely from the fall. Continue with RICE therapy at home for this. F/u with PCP or orthopedic specialist if this does not resolve or improve.   Discharge Instructions   None    ED Prescriptions    Medication Sig  Dispense Auth. Provider   ondansetron (ZOFRAN-ODT) 8 MG disintegrating tablet Take 1 tablet (8 mg total) by mouth every 8 (eight) hours as needed for nausea or vomiting. 15 tablet Lucia EstelleZheng, Zuria Fosdick, NP     Controlled Substance Prescriptions [place] Controlled Substance Registry consulted? Not Applicable   Lucia EstelleZheng, Nahiara Kretzschmar, NP 03/29/18 1505

## 2018-03-29 NOTE — ED Triage Notes (Addendum)
Reports nausea "ever since spraining my ankle and giong to hospital few days ago".  Also c/o left shoulder pain - states worse with movement and palpation - believes pain is from fall few days ago.  Pt winces when left shoulder palpated.

## 2018-03-30 ENCOUNTER — Encounter (HOSPITAL_COMMUNITY): Payer: Self-pay | Admitting: Emergency Medicine

## 2018-03-30 ENCOUNTER — Emergency Department (HOSPITAL_COMMUNITY): Payer: BLUE CROSS/BLUE SHIELD

## 2018-03-30 ENCOUNTER — Emergency Department (HOSPITAL_COMMUNITY)
Admission: EM | Admit: 2018-03-30 | Discharge: 2018-03-30 | Disposition: A | Discharge status: Home / Self Care | Payer: BLUE CROSS/BLUE SHIELD | Attending: Emergency Medicine | Admitting: Emergency Medicine

## 2018-03-30 ENCOUNTER — Other Ambulatory Visit: Payer: Self-pay

## 2018-03-30 DIAGNOSIS — I10 Essential (primary) hypertension: Secondary | ICD-10-CM | POA: Diagnosis not present

## 2018-03-30 DIAGNOSIS — F419 Anxiety disorder, unspecified: Secondary | ICD-10-CM | POA: Diagnosis not present

## 2018-03-30 DIAGNOSIS — J45909 Unspecified asthma, uncomplicated: Secondary | ICD-10-CM | POA: Diagnosis not present

## 2018-03-30 DIAGNOSIS — R002 Palpitations: Secondary | ICD-10-CM | POA: Diagnosis not present

## 2018-03-30 DIAGNOSIS — Z79899 Other long term (current) drug therapy: Secondary | ICD-10-CM | POA: Insufficient documentation

## 2018-03-30 DIAGNOSIS — F329 Major depressive disorder, single episode, unspecified: Secondary | ICD-10-CM | POA: Diagnosis not present

## 2018-03-30 DIAGNOSIS — R112 Nausea with vomiting, unspecified: Secondary | ICD-10-CM | POA: Diagnosis not present

## 2018-03-30 LAB — CBC WITH DIFFERENTIAL/PLATELET
BASOS PCT: 0 %
Basophils Absolute: 0 10*3/uL (ref 0.0–0.1)
EOS ABS: 0.1 10*3/uL (ref 0.0–0.7)
Eosinophils Relative: 1 %
HCT: 40.4 % (ref 39.0–52.0)
HEMOGLOBIN: 13.5 g/dL (ref 13.0–17.0)
Lymphocytes Relative: 33 %
Lymphs Abs: 4.6 10*3/uL — ABNORMAL HIGH (ref 0.7–4.0)
MCH: 29.2 pg (ref 26.0–34.0)
MCHC: 33.4 g/dL (ref 30.0–36.0)
MCV: 87.4 fL (ref 78.0–100.0)
Monocytes Absolute: 0.9 10*3/uL (ref 0.1–1.0)
Monocytes Relative: 6 %
NEUTROS PCT: 60 %
Neutro Abs: 8.4 10*3/uL — ABNORMAL HIGH (ref 1.7–7.7)
Platelets: 355 10*3/uL (ref 150–400)
RBC: 4.62 MIL/uL (ref 4.22–5.81)
RDW: 13.4 % (ref 11.5–15.5)
WBC: 14.1 10*3/uL — AB (ref 4.0–10.5)

## 2018-03-30 LAB — COMPREHENSIVE METABOLIC PANEL
ALBUMIN: 4.5 g/dL (ref 3.5–5.0)
ALK PHOS: 54 U/L (ref 38–126)
ALT: 45 U/L (ref 17–63)
ANION GAP: 11 (ref 5–15)
AST: 36 U/L (ref 15–41)
BUN: 9 mg/dL (ref 6–20)
CALCIUM: 9.6 mg/dL (ref 8.9–10.3)
CO2: 24 mmol/L (ref 22–32)
CREATININE: 0.98 mg/dL (ref 0.61–1.24)
Chloride: 106 mmol/L (ref 101–111)
GFR calc Af Amer: >60 mL/min (ref >60)
GFR calc non Af Amer: >60 mL/min (ref >60)
GLUCOSE: 113 mg/dL — AB (ref 65–99)
Potassium: 3.3 mmol/L — ABNORMAL LOW (ref 3.5–5.1)
SODIUM: 141 mmol/L (ref 135–145)
Total Bilirubin: 1.2 mg/dL (ref 0.3–1.2)
Total Protein: 7.8 g/dL (ref 6.5–8.1)

## 2018-03-30 LAB — TROPONIN I: Troponin I: <0.03 ng/mL (ref <0.03)

## 2018-03-30 MED ORDER — LORAZEPAM 2 MG/ML IJ SOLN
1.0000 mg | Freq: Once | INTRAMUSCULAR | Status: AC
  Administered 2018-03-30: 1 mg via INTRAVENOUS
  Filled 2018-03-30: qty 1

## 2018-03-30 MED ORDER — HYDROXYZINE HCL 25 MG PO TABS: 25.0000 mg | ORAL_TABLET | Freq: Three times a day (TID) | ORAL | 0 refills | Status: DC | PRN

## 2018-03-30 NOTE — Discharge Instructions (Addendum)
Call your doctor tomorrow and set up an appointment this week for recheck

## 2018-03-30 NOTE — ED Triage Notes (Signed)
Pt c/o high panic level, n/v and headache. Pt states he was seen at urgent care yesterday for the same.

## 2018-03-30 NOTE — ED Provider Notes (Signed)
[facility] EMERGENCY [facility]PARTMENT Provider Note   CSN: [id number] Arrival date & time: 03/30/18  1954     History   Chief Complaint Chief Complaint  Patient presents with  . Panic Attack    HPI Omar Krause is a 27 y.o. male.  Patient complains of palpitations.  He also complains of anxiety.  The history is provided by the patient. No language interpreter was used.  Palpitations   This is a new problem. The current episode started 2 days ago. The problem occurs constantly. The problem is associated with fear and stress. Pertinent negatives include no chest pain, no near-syncope, no abdominal pain, no headaches, no back pain and no cough. He has tried nothing for the symptoms. The treatment provided no relief.    Past Medical History:  Diagnosis Date  . Anxiety   . Asthma    No inhaler at home, has not had issues since he was child  . Depression   . Hypertension   . Morbid obesity (HCC)   . Nausea   . Palpitations     Patient Active Problem List   Diagnosis Date Noted  . Insomnia 03/12/2016  . Marijuana use 03/12/2016  . Asthma with acute exacerbation 09/14/2015  . HTN (hypertension) 02/02/2014  . Generalized anxiety disorder 02/02/2014  . Morbid obesity (HCC) 07/08/2013    Past Surgical History:  Procedure Laterality Date  . CIRCUMCISION    . TONSILLECTOMY AND ADENOIDECTOMY          Home Medications    Prior to Admission medications   Medication Sig Start Date End Date Taking? Authorizing Provider  ibuprofen (ADVIL,MOTRIN) 800 MG tablet Take 1 tablet (800 mg total) by mouth 3 (three) times daily. 03/22/18  Yes Triplett, Tammy, PA-C  Multiple Vitamin (MULTIVITAMIN WITH MINERALS) TABS tablet Take 1 tablet by mouth daily.   Yes [provider]  ondansetron (ZOFRAN-ODT) 8 MG disintegrating tablet Take 1 tablet (8 mg total) by mouth every 8 (eight) hours as needed for nausea or vomiting. 03/29/18  Yes Lucia EstelleZheng, Feng, NP  hydrOXYzine (ATARAX/VISTARIL) 25  MG tablet Take 1 tablet (25 mg total) by mouth every 8 (eight) hours as needed for anxiety. 03/30/18   Bethann BerkshireZammit, Melina Mosteller, MD    Family History Family History  Problem Relation Age of Onset  . Arthritis Mother   . Diabetes Mother   . Alcohol abuse Father   . Arthritis Father   . Hyperlipidemia Father   . Stroke Father   . Hypertension Father   . Mental illness Father   . Diabetes Father   . Arthritis Maternal Grandmother   . Stroke Maternal Grandmother   . Diabetes Maternal Grandmother   . Arthritis Maternal Grandfather   . Heart attack Maternal Grandfather 50  . Diabetes Maternal Grandfather     Social History Social History   Tobacco Use  . Smoking status: Never Smoker  . Smokeless tobacco: Never Used  Substance Use Topics  . Alcohol use: No    Frequency: Never  . Drug use: Yes    Types: Marijuana     Allergies   Ceftin [cefuroxime axetil]   Review of Systems Review of Systems  Constitutional: Negative for appetite change and fatigue.  HENT: Negative for congestion, ear discharge and sinus pressure.   Eyes: Negative for discharge.  Respiratory: Negative for cough.   Cardiovascular: Positive for palpitations. Negative for chest pain and near-syncope.  Gastrointestinal: Negative for abdominal pain and diarrhea.  Genitourinary: Negative for frequency and hematuria.  Musculoskeletal: Negative for back pain.  Skin: Negative for rash.  Neurological: Negative for seizures and headaches.  Psychiatric/Behavioral: Negative for hallucinations.     Physical Exam Updated Vital Signs BP (!) 152/64   Pulse 72   Temp 98.4 F (36.9 C) (Oral)   Resp (!) 28   Ht 6' (1.829 m)   Wt (!) 144.2 kg (318 lb)   SpO2 98%   BMI 43.13 kg/m   Physical Exam  Constitutional: He is oriented to person, place, and time. He appears well-developed.  HENT:  Head: Normocephalic.  Eyes: Conjunctivae and EOM are normal. No scleral icterus.  Neck: Neck supple. No thyromegaly present.    Cardiovascular: Normal rate and regular rhythm. Exam reveals no gallop and no friction rub.  No murmur heard. Pulmonary/Chest: No stridor. He has no wheezes. He has no rales. He exhibits no tenderness.  Abdominal: He exhibits no distension. There is no tenderness. There is no rebound.  Musculoskeletal: Normal range of motion. He exhibits no edema.  Lymphadenopathy:    He has no cervical adenopathy.  Neurological: He is oriented to person, place, and time. He exhibits normal muscle tone. Coordination normal.  Skin: No rash noted. No erythema.  Psychiatric: He has a normal mood and affect. His behavior is normal.     ED Treatments / Results  Labs (all labs ordered are listed, but only abnormal results are displayed) Labs Reviewed  CBC WITH DIFFERENTIAL/PLATELET - Abnormal; Notable for the following components:      Result Value   WBC 14.1 (*)    Neutro Abs 8.4 (*)    Lymphs Abs 4.6 (*)    All other components within normal limits  COMPREHENSIVE METABOLIC PANEL - Abnormal; Notable for the following components:   Potassium 3.3 (*)    Glucose, Bld 113 (*)    All other components within normal limits  TROPONIN I    EKG EKG Interpretation  Date/Time:  Sunday March 30 2018 20:07:04 EDT Ventricular Rate:  72 PR Interval:    QRS Duration: 89 QT Interval:  347 QTC Calculation: 380 R Axis:   85 Text Interpretation:  Sinus rhythm Minimal ST depression, diffuse leads Baseline wander in lead(s) II Krause aVF Confirmed by Bethann Berkshire [phone]) on 03/30/2018 8:12:34 PM   Radiology Dg Abd Acute W/chest  Result Date: 03/30/2018 CLINICAL DATA:  Nausea, vomiting and headaches. EXAM: DG ABDOMEN ACUTE W/ 1V CHEST COMPARISON:  January 31, 2018 FINDINGS: There is no evidence of dilated bowel loops or free intraperitoneal air. No radiopaque calculi or other significant radiographic abnormality is seen. Heart size and mediastinal contours are within normal limits. Both lungs are clear. IMPRESSION:  Negative abdominal radiographs.  No acute cardiopulmonary disease. Electronically Signed   By: Sherian Rein M.D.   On: 03/30/2018 21:23    Procedures Procedures (including critical care time)  Medications Ordered in ED Medications  LORazepam (ATIVAN) injection 1 mg (1 mg Intravenous Given 03/30/18 2038)     Initial Impression / Assessment and Plan / ED Course  I have reviewed the triage vital signs and the nursing notes.  Pertinent labs & imaging results that were available during my care of the patient were reviewed by me and considered in my medical decision making (see chart for details).   CBC chemistries EKG chest x-ray unremarkable except for mild elevation white count at 14,000.  Suspect palpitations symptoms are related to stress.  Patient was on the monitor for his entire stay in the ER and did  not show any type of arrhythmia.  He will follow-up with his PCP and is given Vistaril for anxiety  Final Clinical Impressions(s) / ED Diagnoses   Final diagnoses:  Anxiety    ED Discharge Orders        Ordered    hydrOXYzine (ATARAX/VISTARIL) 25 MG tablet  Every 8 hours PRN     03/30/18 2219       Bethann Berkshire, MD 03/30/18 2223

## 2018-03-31 ENCOUNTER — Ambulatory Visit: Payer: BLUE CROSS/BLUE SHIELD | Admitting: Family Medicine

## 2018-03-31 ENCOUNTER — Encounter: Payer: Self-pay | Admitting: Family Medicine

## 2018-03-31 DIAGNOSIS — F411 Generalized anxiety disorder: Secondary | ICD-10-CM | POA: Diagnosis not present

## 2018-03-31 DIAGNOSIS — F321 Major depressive disorder, single episode, moderate: Secondary | ICD-10-CM | POA: Diagnosis not present

## 2018-03-31 DIAGNOSIS — F339 Major depressive disorder, recurrent, unspecified: Secondary | ICD-10-CM | POA: Insufficient documentation

## 2018-03-31 MED ORDER — HYDROXYZINE HCL 50 MG PO TABS: 50.0000 mg | ORAL_TABLET | Freq: Three times a day (TID) | ORAL | 0 refills | Status: DC | PRN

## 2018-03-31 MED ORDER — CITALOPRAM HYDROBROMIDE 20 MG PO TABS
40.0000 mg | ORAL_TABLET | Freq: Every day | ORAL | 3 refills | Status: DC
Start: 2018-03-31 — End: 2018-04-22

## 2018-03-31 NOTE — Assessment & Plan Note (Signed)
Discussed treatment options with patient.  Start Celexa 20 mg daily.  Increase to 40 mg daily he tolerates well within 1 to 2 weeks.  We will also continue hydroxyzine 50 mg 3 times daily as needed for anxiety.  Discussed referral to behavioral therapist, however patient declined.  Follow-up in 4 to 6 weeks.

## 2018-03-31 NOTE — Progress Notes (Signed)
   Subjective:  Omar Krause is a 27 y.o. male who presents today for same-day appointment with a chief complaint of anxiety follow-up.   HPI:  Depression/Anxiety, established problems, worsening Patient initially seen for this about 6 or 7 months ago.  He was started on sertraline.  He did this for about 4 weeks, however stopped as he was not able to afford the medication and was not noticing much improvement in his symptoms.  Patient was not on any medication for the past several months.  About a week ago, he had significant worsening of his symptoms.  He attributes this to his fall in which he sprained his ankle.  Also reports significant stress at school and at home.  He has stopped grad school and is planning on joining the workforce.  Is also has some difficulty with his father.  He was seen in the emergency room yesterday for palpitations and anxiety.  He had a cardiac work-up which was negative.  He was discharged home with hydroxyzine.  States that this has helped some with his anxiety levels.  Depression screen PHQ 2/9 03/31/2018  Decreased Interest 3  Down, Depressed, Hopeless 3  PHQ - 2 Score 6  Altered sleeping 2  Tired, decreased energy 3  Change in appetite 3  Feeling bad or failure about yourself  3  Trouble concentrating 2  Moving slowly or fidgety/restless 3  Suicidal thoughts 1  PHQ-9 Score 23  Difficult doing work/chores Extremely dIfficult    GAD 7 : Generalized Anxiety Score 03/31/2018  Nervous, Anxious, on Edge 3  Control/stop worrying 3  Worry too much - different things 3  Trouble relaxing 3  Restless 3  Easily annoyed or irritable 3  Afraid - awful might happen 3  Total GAD 7 Score 21  Anxiety Difficulty Extremely difficult   ROS: No SI or HI.  PMH: He reports that he has never smoked. He has never used smokeless tobacco. He reports that he has current or past drug history. Drug: Marijuana. He reports that he does not drink alcohol.  Objective:    Physical Exam: BP 140/78 (BP Location: Left Arm, Patient Position: Sitting, Cuff Size: Large)   Pulse 72   Temp 98.6 F (37 C) (Oral)   Ht 6' (1.829 m)   Wt (!) 304 lb 3.2 oz (138 kg)   SpO2 97%   BMI 41.26 kg/m   Gen: NAD, resting comfortably CV: RRR with no murmurs appreciated Pulm: NWOB, CTAB with no crackles, wheezes, or rhonchi Psych: Normal affect and thought content  Assessment/Plan:  Generalized anxiety disorder Discussed treatment options with patient.  Start Celexa 20 mg daily.  Increase to 40 mg daily he tolerates well within 1 to 2 weeks.  We will also continue hydroxyzine 50 mg 3 times daily as needed for anxiety.  Discussed referral to behavioral therapist, however patient declined.  Follow-up in 4 to 6 weeks.  Depression, major, single episode, moderate (HCC) See anxiety A/P.  Start Celexa today.  Increase to 40 mg daily as tolerated.  Follow-up with me in 4 to 6 weeks.  Katina Degreealeb M. Jimmey RalphParker, MD 03/31/2018 3:49 PM

## 2018-03-31 NOTE — Assessment & Plan Note (Signed)
See anxiety A/P.  Start Celexa today.  Increase to 40 mg daily as tolerated.  Follow-up with me in 4 to 6 weeks.

## 2018-03-31 NOTE — Patient Instructions (Signed)
It was very nice to see you today!  Please start.  You can take 1 pill a day for the next 1 to 2 weeks.  After that, please increase to 2 pills daily.  Please let me know if you would like to see our behavioral specialist.  You can take hydroxyzine 50 mg as needed for anxiety.  Please come back to see me in 4 to 6 weeks, or sooner as needed  Take care, Dr Jimmey RalphParker

## 2018-04-22 ENCOUNTER — Other Ambulatory Visit: Payer: Self-pay | Admitting: Family Medicine

## 2018-04-28 ENCOUNTER — Ambulatory Visit: Payer: BLUE CROSS/BLUE SHIELD | Admitting: Family Medicine

## 2018-05-02 ENCOUNTER — Other Ambulatory Visit: Payer: Self-pay | Admitting: Family Medicine

## 2018-05-02 ENCOUNTER — Encounter: Payer: Self-pay | Admitting: Family Medicine

## 2018-05-02 ENCOUNTER — Ambulatory Visit: Payer: BLUE CROSS/BLUE SHIELD | Admitting: Family Medicine

## 2018-05-02 VITALS — BP 132/74 | HR 70 | Temp 97.9°F | Ht 72.0 in | Wt 317.8 lb

## 2018-05-02 DIAGNOSIS — F321 Major depressive disorder, single episode, moderate: Secondary | ICD-10-CM

## 2018-05-02 DIAGNOSIS — F411 Generalized anxiety disorder: Secondary | ICD-10-CM

## 2018-05-02 DIAGNOSIS — G5601 Carpal tunnel syndrome, right upper limb: Secondary | ICD-10-CM

## 2018-05-02 DIAGNOSIS — G5600 Carpal tunnel syndrome, unspecified upper limb: Secondary | ICD-10-CM | POA: Insufficient documentation

## 2018-05-02 MED ORDER — DULOXETINE HCL 60 MG PO CPEP: 60.0000 mg | ORAL_CAPSULE | Freq: Every day | ORAL | 3 refills | Status: DC

## 2018-05-02 MED ORDER — HYDROXYZINE HCL 50 MG PO TABS: 100.0000 mg | ORAL_TABLET | Freq: Three times a day (TID) | ORAL | 1 refills | Status: DC | PRN

## 2018-05-02 MED ORDER — DICLOFENAC SODIUM 75 MG PO TBEC: 75.0000 mg | DELAYED_RELEASE_TABLET | Freq: Two times a day (BID) | ORAL | 0 refills | Status: DC

## 2018-05-02 NOTE — Assessment & Plan Note (Signed)
History and exam consistent with carpal tunnel syndrome.  Patient was placed in a wrist splint today.  Instructed patient to use as often as possible, especially while sleeping.  We will also start 1 to 2 week course of diclofenac. Discussed reasons to return to care.

## 2018-05-02 NOTE — Assessment & Plan Note (Signed)
Patient has not had success with Celexa.  He has failed 3 other SSRIs including Prozac and Zoloft in the past.  He is also felt BuSpar in the past.  We will switch to SNRI today.  Start Cymbalta 60 mg daily.  We will also increase his dose of hydroxyzine to 100 mg 3 times daily as needed.  Advised patient to use Cymbalta at night and hydroxyzine at night as needed for insomnia.  If symptoms are not well controlled with above, would consider addition of atypical antipsychotic such as Abilify or Risperdal, versus starting long-acting benzodiazepine.  He will follow-up with me in 4 weeks.

## 2018-05-02 NOTE — Patient Instructions (Addendum)
It was very nice to see you today!  Please stop the celexa. Start cymbalta 60mg  daily.  Increase your hydroxyzine to 100mg  three times daily as needed.  I think you have carpal tunnel syndrome. Please start the diclofenac and wear the brace as much as possible.  Come back to see me in 4 weeks, or sooner as needed.   Take care, Dr Jimmey RalphParker

## 2018-05-02 NOTE — Progress Notes (Signed)
Subjective:  Omar Krause is a 27 y.o. male who presents today with a chief complaint of depression and anxiety follow up.   HPI:  Depression/Anxiety, established problems, worsening Patient seen about a month ago for this.  At that time we started him on hydroxyzine 50 mg 3 times daily and Celexa 20 mg daily.  He initially did very well for a couple of weeks, however has had significant worsening of his symptoms over the last couple of weeks.  He is currently taking Celexa 40 mg daily.  Hydroxyzine helped a little bit, however wore off eventually.  He has tried other medications in the past including Zoloft and Prozac which did not work.  Is also tried BuSpar which do not work.  Depression screen PHQ 2/9 05/02/2018  Decreased Interest 3  Down, Depressed, Hopeless 3  PHQ - 2 Score 6  Altered sleeping 3  Tired, decreased energy 3  Change in appetite 0  Feeling bad or failure about yourself  3  Trouble concentrating 1  Moving slowly or fidgety/restless 3  Suicidal thoughts 1  PHQ-9 Score 20  Difficult doing work/chores Extremely dIfficult    GAD 7 : Generalized Anxiety Score 05/02/2018  Nervous, Anxious, on Edge 3  Control/stop worrying 3  Worry too much - different things 3  Trouble relaxing 3  Restless 3  Easily annoyed or irritable 3  Afraid - awful might happen 3  Total GAD 7 Score 21  Anxiety Difficulty Extremely difficult    ROS: No SI or HI.  Hand numbness, new problem Several week history.  Located in the first 3 fingers of his right hand.  Worse with sleeping.  Worse with certain positions.  No treatments tried.  No other obvious alleviating or aggravating factors.   ROS: Per HPI  PMH: He reports that he has never smoked. He has never used smokeless tobacco. He reports that he has current or past drug history. Drug: Marijuana. He reports that he does not drink alcohol.  Objective:  Physical Exam: BP 132/74 (BP Location: Left Arm, Patient Position: Sitting,  Cuff Size: Large)   Pulse 70   Temp 97.9 F (36.6 C) (Oral)   Ht 6' (1.829 m)   Wt (!) 317 lb 12.8 oz (144.2 kg)   SpO2 97%   BMI 43.10 kg/m   Gen: NAD, resting comfortably CV: RRR with no murmurs appreciated Pulm: NWOB, CTAB with no crackles, wheezes, or rhonchi MSK: Right hand without deformities.  Tinel sign positive.  Phalen sign positive.  Assessment/Plan:  Generalized anxiety disorder Patient has not had success with Celexa.  He has failed 3 other SSRIs including Prozac and Zoloft in the past.  He is also felt BuSpar in the past.  We will switch to SNRI today.  Start Cymbalta 60 mg daily.  We will also increase his dose of hydroxyzine to 100 mg 3 times daily as needed.  Advised patient to use Cymbalta at night and hydroxyzine at night as needed for insomnia.  If symptoms are not well controlled with above, would consider addition of atypical antipsychotic such as Abilify or Risperdal, versus starting long-acting benzodiazepine.  He will follow-up with me in 4 weeks.  Depression, major, single episode, moderate (HCC) See anxiety A/P.  Symptoms are worsening.  Will switch from Celexa to Cymbalta.  Follow-up with me in 4 weeks.  Consider trial of Effexor fi Cymbalta was not effective.  Carpal tunnel syndrome of right wrist History and exam consistent with carpal tunnel  syndrome.  Patient was placed in a wrist splint today.  Instructed patient to use as often as possible, especially while sleeping.  We will also start 1 to 2 week course of diclofenac. Discussed reasons to return to care.    [name]. [name], MD 05/02/2018 4:31 PM

## 2018-05-02 NOTE — Assessment & Plan Note (Signed)
See anxiety A/P.  Symptoms are worsening.  Will switch from Celexa to Cymbalta.  Follow-up with me in 4 weeks.  Consider trial of Effexor fi Cymbalta was not effective.

## 2018-05-22 ENCOUNTER — Telehealth: Payer: Self-pay

## 2018-05-22 NOTE — Telephone Encounter (Signed)
Attempted to contact patient to triage regarding MyChart office visit he scheduled for 05/23/2018.  LM to return call and placed CRM requesting that he be triaged when he returns the call.

## 2018-05-22 NOTE — Telephone Encounter (Signed)
If patient is having suicidal or homicidal thoughts, he needs to go to the ED.

## 2018-05-23 ENCOUNTER — Encounter: Payer: Self-pay | Admitting: Family Medicine

## 2018-05-23 ENCOUNTER — Ambulatory Visit: Payer: BLUE CROSS/BLUE SHIELD | Admitting: Family Medicine

## 2018-05-23 VITALS — BP 134/72 | HR 73 | Temp 97.9°F | Ht 72.0 in | Wt 312.2 lb

## 2018-05-23 DIAGNOSIS — F321 Major depressive disorder, single episode, moderate: Secondary | ICD-10-CM | POA: Diagnosis not present

## 2018-05-23 DIAGNOSIS — F411 Generalized anxiety disorder: Secondary | ICD-10-CM

## 2018-05-23 MED ORDER — DIAZEPAM 5 MG PO TABS: 5.0000 mg | ORAL_TABLET | Freq: Two times a day (BID) | ORAL | 0 refills | Status: DC | PRN

## 2018-05-23 MED ORDER — QUETIAPINE FUMARATE 100 MG PO TABS: 100.0000 mg | ORAL_TABLET | Freq: Every day | ORAL | 1 refills | Status: DC

## 2018-05-23 NOTE — Patient Instructions (Addendum)
It was very nice to see you today!  STOP the cymbalta. Please start the seroquel. Start at 100mg  per night. Increase to 200mg  per night if you do well after a few days. You can then increase to 300mg  per night.  Please use the Valium as needed.   I will place a referral to psychiatry.  Come back to see me in 3-4 weeks if you have not seen the psychiatrist by then.  Please seek medical care if you have any homicidal or suicidal thoughts.   Take care, Dr Jimmey RalphParker

## 2018-05-23 NOTE — Assessment & Plan Note (Addendum)
See anxiety A/P.  Stop Cymbalta.  Start Seroquel.  Place referral to psychiatry.  Patient denies any active SI or HI today.  Discussed need to be emergently evaluated if he has any SI or HI.  Patient voiced understanding.

## 2018-05-23 NOTE — Progress Notes (Signed)
Subjective:  Omar Krause is a 27 y.o. male who presents today with a chief complaint of anxiety follow up.   HPI:  Depression/Anxiety, established problems, worsening Patient last seen for these problem about 3 weeks ago.  Symptoms have been persistent for at least the last couple of years.  At his last visit, we started him on cymbalta 60mg . Symptoms have continued to worsen over that time. He has been compliant with his medications, but has had worsening symptoms. He is also concerned that he has been having some audio and visual hallucinations. Also reports that he has been very quick to get angry and often feels guilt about his outbursts. He occasionally thinks that he sees things out of the corner of his eye that other people can't see. He also sometimes hears things that his mother cannot hear. He does not hear any specific voices.  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  These have made his symptoms worse.  He does have a few stress relieving techniques including going outside which seems to help a little bit.  MDQ: 11 out of 13 positive for Question 1 Yes for question 2 "Serious Problem" for question 3   Depression screen PHQ 2/9 05/23/2018  Decreased Interest 3  Down, Depressed, Hopeless 3  PHQ - 2 Score 6  Altered sleeping 3  Tired, decreased energy 3  Change in appetite 3  Feeling bad or failure about yourself  3  Trouble concentrating 3  Moving slowly or fidgety/restless 3  Suicidal thoughts 2  PHQ-9 Score 26  Difficult doing work/chores -    GAD 7 : Generalized Anxiety Score 05/23/2018  Nervous, Anxious, on Edge 3  Control/stop worrying 3  Worry too much - different things 3  Trouble relaxing 3  Restless 3  Easily annoyed or irritable 3  Afraid - awful might happen 3  Total GAD 7 Score 21  Anxiety Difficulty -   ROS: Per HPI  PMH: He reports that he has never smoked. He  has never used smokeless tobacco. He reports that he has current or past drug history. Drug: Marijuana. He reports that he does not drink alcohol.  Objective:  Physical Exam: BP 134/72 (BP Location: Left Arm, Patient Position: Sitting, Cuff Size: Normal)   Pulse 73   Temp 97.9 F (36.6 C) (Oral)   Ht 6' (1.829 m)   Wt (!) 312 lb 3.2 oz (141.6 kg)   SpO2 98%   BMI 42.34 kg/m   Gen: NAD, resting comfortably CV: RRR with no murmurs appreciated Pulm: NWOB, CTAB with no crackles, wheezes, or rhonchi Psych: Anxious appearing.  Diaphoretic.  Poor eye contact.  No apparent AVH.  No apparent delusions.  Normal thought content.  Assessment/Plan:  Generalized anxiety disorder Patient symptoms continue to be very poorly controlled.  He does have a positive MDQ with prior manic-like episodes including increased self-confidence, lack of sleep, racing thoughts, easily distracted, and risky behaviors.  It is very possible that he has underlying bipolar disorder contributing as well.  We will stop his Cymbalta as this is not helping with his current symptoms he could potentially be worsening his bipolar type symptoms.  He has tried SSRIs, BuSpar, and Cymbalta without significant improvement.  Given possibility for underlying bipolar, we will switch to atypical antipsychotic.  Start Seroquel 100 mg daily.  He will titrate to 300 mg as tolerated over  the next few weeks.  We will also give a short supply of low-dose Valium given the severity of his current symptoms.  Discussed with patient that this would not be a long-term option.  He is particularly interested in starting MAO inhibitor.  Will place referral to psychiatry for further evaluation and treatment.  He will come back to see me in a few weeks if he is not able to get in to see psychiatry by then.  Depression, major, single episode, moderate (HCC) See anxiety A/P.  Stop Cymbalta.  Start Seroquel.  Place referral to psychiatry.  Patient denies any  active SI or HI today.  Discussed need to be emergently evaluated if he has any SI or HI.  Patient voiced understanding.   Katina Degreealeb M. Jimmey RalphParker, MD 05/23/2018 4:31 PM

## 2018-05-23 NOTE — Assessment & Plan Note (Addendum)
Patient symptoms continue to be very poorly controlled.  He does have a positive MDQ with prior manic-like episodes including increased self-confidence, lack of sleep, racing thoughts, easily distracted, and risky behaviors.  It is very possible that he has underlying bipolar disorder contributing as well.  We will stop his Cymbalta as this is not helping with his current symptoms he could potentially be worsening his bipolar type symptoms.  He has tried SSRIs, BuSpar, and Cymbalta without significant improvement.  Given possibility for underlying bipolar, we will switch to atypical antipsychotic.  Start Seroquel 100 mg daily.  He will titrate to 300 mg as tolerated over the next few weeks.  We will also give a short supply of low-dose Valium given the severity of his current symptoms.  Discussed with patient that this would not be a long-term option.  He is particularly interested in starting MAO inhibitor.  Will place referral to psychiatry for further evaluation and treatment.  He will come back to see me in a few weeks if he is not able to get in to see psychiatry by then.

## 2018-06-03 ENCOUNTER — Encounter: Payer: Self-pay | Admitting: Family Medicine

## 2018-06-03 ENCOUNTER — Ambulatory Visit: Payer: BLUE CROSS/BLUE SHIELD | Admitting: Family Medicine

## 2018-06-03 VITALS — BP 130/68 | HR 90 | Temp 97.9°F | Ht 72.0 in | Wt 320.0 lb

## 2018-06-03 DIAGNOSIS — F411 Generalized anxiety disorder: Secondary | ICD-10-CM

## 2018-06-03 DIAGNOSIS — L249 Irritant contact dermatitis, unspecified cause: Secondary | ICD-10-CM | POA: Diagnosis not present

## 2018-06-03 MED ORDER — PREDNISONE 20 MG PO TABS: ORAL_TABLET | ORAL | 0 refills | Status: AC

## 2018-06-03 MED ORDER — QUETIAPINE FUMARATE 100 MG PO TABS: 300.0000 mg | ORAL_TABLET | Freq: Every day | ORAL | 2 refills | Status: DC

## 2018-06-03 NOTE — Patient Instructions (Signed)
It was very nice to see you today!  Please start the prednisone.  Please let me know if this causes any side effects.  I will refill your Seroquel and send in referral to psychiatry.  Please come back to see me in 4 to 6 weeks if you have not yet been seen by psychiatry.  Take care, Dr Jimmey RalphParker

## 2018-06-03 NOTE — Progress Notes (Signed)
   Subjective:  Omar Krause is a 27 y.o. male who presents today for same-day appointment with a chief complaint of rash.   HPI:  Rash, Acute problem Started 2 days ago. Stable over that time. Patient was cutting a tree that fell in his yard with a chainsaw.  After cutting up the tree, he noticed that was covered in poison oak.  Soon afterwards started developing rash to his abdomen, arms, and legs.  He is tried using calamine lotion and cortisone cream with modest improvement.  Has also tried Benadryl which is helped a little bit.  No other treatments tried.  No other obvious alleviating or aggravating factors.  Generalized anxiety disorder, established problem, improving Patient seen about 11 days ago for this.  At that time, we started him on Seroquel as he had had no significant improvement on SSRI therapy.  He has done very well over the last few days.  Has noticed significant improvement in his anxiety and anger outburst.  Still has occasional depressive symptoms.  ROS: Per HPI  PMH: He reports that he has never smoked. He has never used smokeless tobacco. He reports that he has current or past drug history. Drug: Marijuana. He reports that he does not drink alcohol.  Objective:  Physical Exam: BP 130/68 (BP Location: Left Arm, Patient Position: Sitting, Cuff Size: Large)   Pulse 90   Temp 97.9 F (36.6 C) (Oral)   Ht 6' (1.829 m)   Wt (!) 320 lb (145.2 kg)   SpO2 98%   BMI 43.40 kg/m   Gen: NAD, resting comfortably CV: RRR with no murmurs appreciated Pulm: NWOB, CTAB with no crackles, wheezes, or rhonchi Skin: Widespread erythematous rash involving upper extremities, abdomen, and lower extremities consistent with contact dermatitis  Assessment/Plan:  Contact dermatitis Start prednisone 40 mg daily for 1 week, then 20 mg daily for 1 week.  Discussed possible worsening of his anxiety symptoms on medication.  Patient will keep a close eye on this.  Also recommended continued  use of calamine lotion, topical cortisone cream.  Also recommended use of over-the-counter antihistamine such as Claritin or Zyrtec.  Generalized anxiety disorder Significantly improved with Seroquel.  We will continue Seroquel 300 mg daily.  He is awaiting psychiatry referral.  He will follow-up with me in 4 to 6 weeks if he has not been seen by psychiatry by then.  Discussed reasons to return to care sooner.  Katina Degreealeb M. Jimmey RalphParker, MD 06/03/2018 3:49 PM

## 2018-06-03 NOTE — Assessment & Plan Note (Signed)
Significantly improved with Seroquel.  We will continue Seroquel 300 mg daily.  He is awaiting psychiatry referral.  He will follow-up with me in 4 to 6 weeks if he has not been seen by psychiatry by then.  Discussed reasons to return to care sooner.

## 2018-06-04 ENCOUNTER — Encounter: Payer: Self-pay | Admitting: Family Medicine

## 2018-06-05 ENCOUNTER — Other Ambulatory Visit: Payer: Self-pay

## 2018-06-05 MED ORDER — TRIAMCINOLONE ACETONIDE 0.5 % EX OINT: 1.0000 "application " | TOPICAL_OINTMENT | Freq: Two times a day (BID) | CUTANEOUS | 0 refills | Status: DC

## 2018-06-18 ENCOUNTER — Telehealth: Payer: Self-pay | Admitting: Family Medicine

## 2018-06-18 ENCOUNTER — Other Ambulatory Visit: Payer: Self-pay | Admitting: Family Medicine

## 2018-06-18 NOTE — Telephone Encounter (Signed)
Copied from CRM [id number]. Topic: General - Other >> Jun 18, 2018  3:59 PM Percival SpanishKennedy, [name] wrote:  Pt call to say the below RX should be for 300 because it was changes the last time he was in the office  QUEtiapine (SEROQUEL) 100 MG tablet  Pharmacy [facility] [place]

## 2018-06-19 MED ORDER — DIAZEPAM 5 MG PO TABS: 5.0000 mg | ORAL_TABLET | Freq: Two times a day (BID) | ORAL | 0 refills | Status: DC | PRN

## 2018-06-19 NOTE — Addendum Note (Signed)
Addended by: Olevia BowensZELLMER, Jewell Ryans M on: 06/19/2018 03:00 PM   Modules accepted: Orders

## 2018-06-19 NOTE — Telephone Encounter (Signed)
Spoke to pt, told him Dr. Jimmey RalphParker is out of the office but Dr.Hunter will send your Rx for Valium to the pharmacy sometime this evening. Also in regards to your Seroquel Rx was sent to pharmacy as last visit and Dr. Jimmey RalphParker kept it 100 mg tablets but he is suppose to take 3 tablets at a time which would equal 300 mg. Pt verbalized understanding.

## 2018-06-19 NOTE — Telephone Encounter (Signed)
Dr. Ala BentHunter-please send electronically

## 2018-06-19 NOTE — Telephone Encounter (Signed)
[facility] [place]

## 2018-06-19 NOTE — Telephone Encounter (Signed)
Left message on voicemail to call office. Please tell pt refill for Seroque was sent to pharmacy as last visit and Dr. Jimmey RalphParker kept it 100 mg tablets but he is suppose to take 3 tablets at a time which would equal 300 mg.

## 2018-06-19 NOTE — Telephone Encounter (Signed)
Dr. Durene CalHunter-  Please see rx request and PMP report.  Last seen: 8/13 Last filled: 8/2

## 2018-06-19 NOTE — Telephone Encounter (Signed)
See note.   Copied from CRM [id number]. Topic: Quick Communication - See Telephone Encounter >> Jun 19, 2018  4:32 PM Herby AbrahamJohnson, Shiquita C wrote: CRM for notification. See Telephone encounter for: 06/19/18.  Pt says that he was told that his Rx for diazepam (VALIUM) 5 MG tablet has not been received. Showing that Rx was sent today. Please assist pt further.

## 2018-06-25 ENCOUNTER — Telehealth: Payer: Self-pay | Admitting: Family Medicine

## 2018-06-25 NOTE — Telephone Encounter (Signed)
LM for patient to return call.

## 2018-06-25 NOTE — Telephone Encounter (Signed)
Patient called after hours on Sunday stating that he fell on his hand earlier and his thumb hurts a lot and can not bend it. Triage nurse for [facility] attempted to reach the patient to discuss further but no answer.

## 2018-07-01 ENCOUNTER — Encounter: Payer: Self-pay | Admitting: Family Medicine

## 2018-07-01 ENCOUNTER — Ambulatory Visit (INDEPENDENT_AMBULATORY_CARE_PROVIDER_SITE_OTHER): Payer: BLUE CROSS/BLUE SHIELD

## 2018-07-01 ENCOUNTER — Ambulatory Visit: Payer: BLUE CROSS/BLUE SHIELD | Admitting: Family Medicine

## 2018-07-01 VITALS — BP 132/74 | HR 74 | Temp 97.9°F | Ht 72.0 in | Wt 318.2 lb

## 2018-07-01 DIAGNOSIS — M79644 Pain in right finger(s): Secondary | ICD-10-CM

## 2018-07-01 DIAGNOSIS — F411 Generalized anxiety disorder: Secondary | ICD-10-CM | POA: Diagnosis not present

## 2018-07-01 DIAGNOSIS — M9907 Segmental and somatic dysfunction of upper extremity: Secondary | ICD-10-CM

## 2018-07-01 DIAGNOSIS — F321 Major depressive disorder, single episode, moderate: Secondary | ICD-10-CM | POA: Diagnosis not present

## 2018-07-01 DIAGNOSIS — S63219A Subluxation of metacarpophalangeal joint of unspecified finger, initial encounter: Secondary | ICD-10-CM

## 2018-07-01 MED ORDER — DIAZEPAM 5 MG PO TABS: 5.0000 mg | ORAL_TABLET | Freq: Two times a day (BID) | ORAL | 0 refills | Status: DC | PRN

## 2018-07-01 NOTE — Progress Notes (Signed)
   Subjective:  Omar Krause is a 27 y.o. male who presents today for same-day appointment with a chief complaint of thumb pain.   HPI:  Thumb Pain, Acute problem Symptoms started 2 days ago. Patient was trying to pull a thorn out of the bottom his shoe when he lost balance and fell onto his right hand and thumb.  Immediately noticed pain and swelling of the area.  Pain is been stable over the past couple days however still significant at the base of his right thumb.  He is not able to move his thumb at his MCP joint.  No treatments tried.  Pain is worse with movement.  Depression/Anxiety, established problems, Stable Patient last seen about a month ago for this.  At that visit, we increased his Seroquel to 300 mg daily.  He has done well with this.  Overall, feels like his symptoms are a little bit better.  Needs refill on Valium today.  He has an appointment with psychiatry in 2 weeks.  Depression screen PHQ 2/9 07/01/2018  Decreased Interest 3  Down, Depressed, Hopeless 2  PHQ - 2 Score 5  Altered sleeping 3  Tired, decreased energy 3  Change in appetite 3  Feeling bad or failure about yourself  3  Trouble concentrating 2  Moving slowly or fidgety/restless 3  Suicidal thoughts 1  PHQ-9 Score 23  Difficult doing work/chores Extremely dIfficult    GAD 7 : Generalized Anxiety Score 07/01/2018  Nervous, Anxious, on Edge 3  Control/stop worrying 3  Worry too much - different things 3  Trouble relaxing 3  Restless 3  Easily annoyed or irritable 3  Afraid - awful might happen 2  Total GAD 7 Score 20  Anxiety Difficulty Extremely difficult    ROS: No SI or HI.   Needs refill on diazepam   ROS: Per HPI  PMH: He reports that he has never smoked. He has never used smokeless tobacco. He reports that he has current or past drug history. Drug: Marijuana. He reports that he does not drink alcohol.  Objective:  Physical Exam: BP 132/74 (BP Location: Left Arm, Patient Position:  Sitting, Cuff Size: Large)   Pulse 74   Temp 97.9 F (36.6 C) (Oral)   Ht 6' (1.829 m)   Wt (!) 318 lb 3.2 oz (144.3 kg)   SpO2 99%   BMI 43.16 kg/m   Gen: NAD, resting comfortably CV: RRR with no murmurs appreciated Pulm: NWOB, CTAB with no crackles, wheezes, or rhonchi MSK: -Hand: Right first MCP joint with mild edema. Very limited opposition at MCP joint.  Normal cap refill.  Neurovascular intact distally.   Assessment/Plan:  Right first MCP subluxation Reduce in office by Dr. Berline Chough.  Please see his separate procedure note.  Patient was placed in a thumb spica splint graduated.  He will follow-up with sports medicine in 1 to 2 weeks.  Generalized anxiety disorder GAD continues to be significantly elevated however patient feels somewhat better than last visit.  I refilled Valium today.  He will continue his Seroquel.  He will follow-up with psychiatry in 2 weeks.  Depression, major, single episode, moderate (HCC) See anxiety A/P.  PHQ 9 still significantly elevated.  Continue Seroquel and Valium.  Follow-up with psychiatry in 2 weeks.  [name]. [name], MD 07/01/2018 4:00 PM

## 2018-07-01 NOTE — Patient Instructions (Signed)
It was very nice to see you today!  You had a subluxation of your thumb.  Dr Berline Chough placed this back into joint for you.  Please use the split.   I will refill your other meds.   Take care, Dr [name]

## 2018-07-01 NOTE — Assessment & Plan Note (Signed)
See anxiety A/P.  PHQ 9 still significantly elevated.  Continue Seroquel and Valium.  Follow-up with psychiatry in 2 weeks.

## 2018-07-01 NOTE — Assessment & Plan Note (Signed)
GAD continues to be significantly elevated however patient feels somewhat better than last visit.  I refilled Valium today.  He will continue his Seroquel.  He will follow-up with psychiatry in 2 weeks.

## 2018-07-08 ENCOUNTER — Encounter: Payer: Self-pay | Admitting: Family Medicine

## 2018-07-08 NOTE — Progress Notes (Signed)
Was asked to see the patient with Dr. Jimmey RalphParker today.  Patient had a persistently flexed MCP but was ligamentously stable.  After discussing risks and benefits below procedure was performed with good improvement.  PROCEDURE NOTE : OSTEOPATHIC MANIPULATION The decision today to treat with Osteopathic Manipulative Therapy (OMT) was based on physical exam findings. Verbal consent was obtained following a discussion with the patient regarding the of risks, benefits and potential side effects, including an acute pain flare,post manipulation soreness and need for repeat treatments.     Contraindications to OMT: NONE  Manipulation was performed as below: Regions Treated OMT Techniques Used  Upper extremities myofascial release soft tissue   The patient tolerated the treatment well and reported Improved symptoms following treatment today. Patient was given medications, exercises, stretches and lifestyle modifications per AVS and verbally.   OSTEOPATHIC/STRUCTURAL EXAM:   Subluxed MCP.  Gentle traction with reduction accomplished without significant pain.  Improved range of motion.

## 2018-07-08 NOTE — Telephone Encounter (Signed)
Not entire sure what patient is talking about. We did not send him any messages.  He may be referring to the radiologist report on his xray. It was read as negative but that does not mean there is nothing wrong with his thumb. He had a subluxation that was reduced by Dr Berline Choughigby. He should follow up with one of us in a week or so.  Katina Degreealeb M. Jimmey RalphParker, MD 07/08/2018 12:01 PM

## 2018-07-11 ENCOUNTER — Other Ambulatory Visit: Payer: Self-pay | Admitting: Family Medicine
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
## 2018-07-15 ENCOUNTER — Ambulatory Visit: Payer: BLUE CROSS/BLUE SHIELD | Admitting: Family Medicine

## 2018-07-16 ENCOUNTER — Telehealth (HOSPITAL_COMMUNITY): Payer: Self-pay | Admitting: Psychiatry

## 2018-07-16 ENCOUNTER — Ambulatory Visit (INDEPENDENT_AMBULATORY_CARE_PROVIDER_SITE_OTHER): Payer: BLUE CROSS/BLUE SHIELD | Admitting: Psychiatry

## 2018-07-16 ENCOUNTER — Encounter (HOSPITAL_COMMUNITY): Payer: Self-pay | Admitting: Psychiatry

## 2018-07-16 VITALS — BP 166/90 | HR 79 | Ht 72.0 in | Wt 318.0 lb

## 2018-07-16 DIAGNOSIS — F321 Major depressive disorder, single episode, moderate: Secondary | ICD-10-CM

## 2018-07-16 DIAGNOSIS — F4310 Post-traumatic stress disorder, unspecified: Secondary | ICD-10-CM | POA: Diagnosis not present

## 2018-07-16 DIAGNOSIS — F1290 Cannabis use, unspecified, uncomplicated: Secondary | ICD-10-CM

## 2018-07-16 DIAGNOSIS — F419 Anxiety disorder, unspecified: Secondary | ICD-10-CM

## 2018-07-16 DIAGNOSIS — Z62810 Personal history of physical and sexual abuse in childhood: Secondary | ICD-10-CM

## 2018-07-16 DIAGNOSIS — G4700 Insomnia, unspecified: Secondary | ICD-10-CM

## 2018-07-16 DIAGNOSIS — Z62811 Personal history of psychological abuse in childhood: Secondary | ICD-10-CM

## 2018-07-16 DIAGNOSIS — R45851 Suicidal ideations: Secondary | ICD-10-CM

## 2018-07-16 MED ORDER — BUPROPION HCL ER (XL) 150 MG PO TB24: 150.0000 mg | ORAL_TABLET | Freq: Every day | ORAL | 0 refills | Status: DC

## 2018-07-16 NOTE — Patient Instructions (Signed)
1. Start Wellbutrin 150 mg daily  2. Continue quetiapine 300 mg at night  3. Return to clinic in one month for 30 mins 4. Referral to therapy  5. Referral to IOP

## 2018-07-16 NOTE — Telephone Encounter (Signed)
D:  Dr. Vanetta Shawl referred pt to [facility].  A:  Placed call to pt, but there was no answer.  Left vm requesting that he call writer back.  Informed Dr. Vanetta Shawl.

## 2018-07-24 ENCOUNTER — Encounter: Payer: Self-pay | Admitting: Family Medicine

## 2018-07-28 ENCOUNTER — Other Ambulatory Visit: Payer: Self-pay | Admitting: Family Medicine

## 2018-07-28 ENCOUNTER — Telehealth (HOSPITAL_COMMUNITY): Payer: Self-pay | Admitting: *Deleted

## 2018-07-28 NOTE — Telephone Encounter (Signed)
Could you contact the patient to get more details, thanks.

## 2018-07-28 NOTE — Telephone Encounter (Signed)
Dr Vanetta Shawl Patient called requesting call back from you [phone]. Patient stated that medication started on 07/16/18 he's now having some side effects "stated he's hearing things"

## 2018-07-29 NOTE — Telephone Encounter (Signed)
Left voice message to call back. Please instruct the patient to discontinue wellbutrin and see if it helps for those symptoms. If he is interested in starting lexapro (another antidepressant), I can order it to start after his symptoms subside.

## 2018-07-29 NOTE — Telephone Encounter (Signed)
Dr Richardo Priest with patient & per provider contacted the patient to get more details. Patient states "he hear's things @ night that he knows is not there". "It's always muffled voices-- unable to hear clearly". Started hearing muffled voices once he started the Buprion-- never happened before

## 2018-07-29 NOTE — Telephone Encounter (Signed)
Please advise 

## 2018-07-29 NOTE — Telephone Encounter (Signed)
LVM Per Provider requesting call back : Could you contact the patient to get more details, thanks

## 2018-08-06 NOTE — Telephone Encounter (Signed)
I think it would be the best to discuss on the follow up appointment then given there may be more chance of side effect from other medication.

## 2018-08-06 NOTE — Telephone Encounter (Signed)
Dr Vanetta Shawl Patient has finally returned my call & stated that he's been off the Wellbutrin & I informed that if interested you would order Lexapro. He asked what's the generic name? And I when I said Escitalopram he stated he's previously been on that and it didn't work . Asked if there is anything else he mat try?

## 2018-08-08 ENCOUNTER — Other Ambulatory Visit: Payer: Self-pay

## 2018-08-08 ENCOUNTER — Emergency Department (HOSPITAL_COMMUNITY)
Admission: EM | Admit: 2018-08-08 | Discharge: 2018-08-09 | Discharge status: Against Medical Advice | Payer: BLUE CROSS/BLUE SHIELD | Attending: Emergency Medicine | Admitting: Emergency Medicine

## 2018-08-08 ENCOUNTER — Encounter (HOSPITAL_COMMUNITY): Payer: Self-pay | Admitting: Emergency Medicine

## 2018-08-08 ENCOUNTER — Emergency Department (HOSPITAL_COMMUNITY): Payer: BLUE CROSS/BLUE SHIELD

## 2018-08-08 DIAGNOSIS — L237 Allergic contact dermatitis due to plants, except food: Secondary | ICD-10-CM

## 2018-08-08 DIAGNOSIS — T59811A Toxic effect of smoke, accidental (unintentional), initial encounter: Secondary | ICD-10-CM | POA: Diagnosis not present

## 2018-08-08 DIAGNOSIS — T5991XA Toxic effect of unspecified gases, fumes and vapors, accidental (unintentional), initial encounter: Secondary | ICD-10-CM | POA: Diagnosis not present

## 2018-08-08 DIAGNOSIS — Z79899 Other long term (current) drug therapy: Secondary | ICD-10-CM | POA: Diagnosis not present

## 2018-08-08 DIAGNOSIS — J45909 Unspecified asthma, uncomplicated: Secondary | ICD-10-CM | POA: Insufficient documentation

## 2018-08-08 DIAGNOSIS — R1013 Epigastric pain: Secondary | ICD-10-CM | POA: Diagnosis not present

## 2018-08-08 DIAGNOSIS — J705 Respiratory conditions due to smoke inhalation: Secondary | ICD-10-CM | POA: Diagnosis not present

## 2018-08-08 DIAGNOSIS — Z5320 Procedure and treatment not carried out because of patient's decision for unspecified reasons: Secondary | ICD-10-CM | POA: Insufficient documentation

## 2018-08-08 DIAGNOSIS — I10 Essential (primary) hypertension: Secondary | ICD-10-CM | POA: Diagnosis not present

## 2018-08-08 DIAGNOSIS — R0602 Shortness of breath: Secondary | ICD-10-CM | POA: Diagnosis not present

## 2018-08-08 NOTE — ED Notes (Signed)
Patient given water to drink with no problems swallowing at this time.
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
## 2018-08-09 ENCOUNTER — Encounter: Payer: Self-pay | Admitting: Family Medicine

## 2018-08-09 MED ORDER — FAMOTIDINE 20 MG PO TABS
20.0000 mg | ORAL_TABLET | Freq: Once | ORAL | Status: DC
  Filled 2018-08-09: qty 1

## 2018-08-09 MED ORDER — PREDNISONE 50 MG PO TABS: 60.0000 mg | ORAL_TABLET | Freq: Once | ORAL | Status: DC

## 2018-08-09 MED ORDER — DIPHENHYDRAMINE HCL 25 MG PO CAPS
50.0000 mg | ORAL_CAPSULE | Freq: Once | ORAL | Status: DC
  Filled 2018-08-09: qty 2
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
## 2018-08-09 NOTE — ED Notes (Signed)
Patient left AMA and did not get his medications or discharge instructions.

## 2018-08-11 ENCOUNTER — Encounter: Payer: Self-pay | Admitting: Family Medicine

## 2018-08-11 ENCOUNTER — Ambulatory Visit: Payer: BLUE CROSS/BLUE SHIELD | Admitting: Family Medicine

## 2018-08-11 VITALS — BP 152/108 | HR 84 | Temp 98.0°F | Wt 314.2 lb

## 2018-08-11 DIAGNOSIS — R030 Elevated blood-pressure reading, without diagnosis of hypertension: Secondary | ICD-10-CM

## 2018-08-11 DIAGNOSIS — L237 Allergic contact dermatitis due to plants, except food: Secondary | ICD-10-CM

## 2018-08-11 DIAGNOSIS — R569 Unspecified convulsions: Secondary | ICD-10-CM | POA: Diagnosis not present

## 2018-08-11 NOTE — Patient Instructions (Signed)
It was very nice to see you today!  Please continue using the hydroxyzine as needed.  We will check blood work today and send in a referral for you to see the neurologist.   Take care, Dr [name]
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
## 2018-08-12 LAB — COMPREHENSIVE METABOLIC PANEL
ALT: 34 U/L (ref 0–53)
AST: 25 U/L (ref 0–37)
Albumin: 4.8 g/dL (ref 3.5–5.2)
Alkaline Phosphatase: 61 U/L (ref 39–117)
BILIRUBIN TOTAL: 0.3 mg/dL (ref 0.2–1.2)
BUN: 10 mg/dL (ref 6–23)
CO2: 23 meq/L (ref 19–32)
CREATININE: 0.91 mg/dL (ref 0.40–1.50)
Calcium: 9.4 mg/dL (ref 8.4–10.5)
Chloride: 106 mEq/L (ref 96–112)
GFR: 105.36 mL/min (ref >60.00)
GLUCOSE: 85 mg/dL (ref 70–99)
Potassium: 4.1 mEq/L (ref 3.5–5.1)
Sodium: 138 mEq/L (ref 135–145)
Total Protein: 7.4 g/dL (ref 6.0–8.3)

## 2018-08-12 LAB — CBC
HCT: 44.2 % (ref 39.0–52.0)
Hemoglobin: 14.7 g/dL (ref 13.0–17.0)
MCHC: 33.3 g/dL (ref 30.0–36.0)
MCV: 84.6 fl (ref 78.0–100.0)
Platelets: 284 10*3/uL (ref 150.0–400.0)
RBC: 5.23 Mil/uL (ref 4.22–5.81)
RDW: 14.8 % (ref 11.5–15.5)
WBC: 8.1 10*3/uL (ref 4.0–10.5)

## 2018-08-12 NOTE — Progress Notes (Signed)
Dr Lavone Neri interpretation of your lab work:  Your blood work was all normal. We do not need to make any changes to your treatment plan at this time. Please follow up with neurology to discuss your episode of seizure like activity. Please let me know or go to the ED if your symptoms happen again.    If you have any additional questions, please give [place] a call or send [place] a message through [place].  Take care, Dr [name]

## 2018-08-14 ENCOUNTER — Encounter: Payer: Self-pay | Admitting: Neurology

## 2018-08-18 ENCOUNTER — Ambulatory Visit (HOSPITAL_COMMUNITY): Payer: BLUE CROSS/BLUE SHIELD | Admitting: Psychiatry

## 2018-08-18 ENCOUNTER — Ambulatory Visit: Payer: BLUE CROSS/BLUE SHIELD | Admitting: Neurology

## 2018-08-18 ENCOUNTER — Encounter: Payer: Self-pay | Admitting: Neurology

## 2018-08-18 ENCOUNTER — Other Ambulatory Visit: Payer: Self-pay

## 2018-08-18 VITALS — BP 158/74 | HR 97 | Ht 72.0 in | Wt 321.0 lb

## 2018-08-18 DIAGNOSIS — R404 Transient alteration of awareness: Secondary | ICD-10-CM | POA: Diagnosis not present

## 2018-08-18 NOTE — Patient Instructions (Addendum)
1. Schedule MRI brain with and without contrast 2. Schedule MRA head without contrast 3. Schedule 1-hour sleep-deprived EEG 4. Schedule EKG through your PCP's office 5. Follow-up after tests, call for any changes 6. As per [place] driving laws, no driving after an episode of loss of awareness, until 6 months event-free  We have sent a referral to [facility] for your MRI and MRA and they will call you directly to schedule your appt. They are located at [address] [facility]. If you need to contact them directly please call [phone].
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
## 2018-08-20 ENCOUNTER — Encounter (HOSPITAL_COMMUNITY): Payer: Self-pay | Admitting: Psychiatry

## 2018-08-20 ENCOUNTER — Ambulatory Visit (INDEPENDENT_AMBULATORY_CARE_PROVIDER_SITE_OTHER): Payer: BLUE CROSS/BLUE SHIELD | Admitting: Psychiatry

## 2018-08-20 VITALS — BP 176/96 | HR 93 | Ht 72.0 in | Wt 328.0 lb

## 2018-08-20 DIAGNOSIS — F419 Anxiety disorder, unspecified: Secondary | ICD-10-CM

## 2018-08-20 DIAGNOSIS — F321 Major depressive disorder, single episode, moderate: Secondary | ICD-10-CM

## 2018-08-20 DIAGNOSIS — F4310 Post-traumatic stress disorder, unspecified: Secondary | ICD-10-CM | POA: Diagnosis not present

## 2018-08-20 MED ORDER — ESCITALOPRAM OXALATE 5 MG PO TABS: 5.0000 mg | ORAL_TABLET | Freq: Every day | ORAL | 1 refills | Status: DC

## 2018-08-20 NOTE — Patient Instructions (Signed)
1. Start lexapro 5 mg daily   2. Continue quetiapine 300 mg at night  3. Return to clinic in two months for 30 mins

## 2018-08-25 ENCOUNTER — Telehealth (HOSPITAL_COMMUNITY): Payer: Self-pay | Admitting: *Deleted

## 2018-08-25 NOTE — Telephone Encounter (Signed)
Discussed with the patient. Advised him to discontinue lexapro. He feels comfortable remaining on quetiapine only at this time. (he will not be a good candidate for TCA as he has seizure like episode. )

## 2018-08-25 NOTE — Telephone Encounter (Signed)
Dr Vanetta Shawl Patient called in stating that the Lexapro is making him sick . 'He's sick to his stomach vomiting, & really nauseated'

## 2018-08-26 ENCOUNTER — Telehealth: Payer: Self-pay | Admitting: Family Medicine

## 2018-08-26 ENCOUNTER — Telehealth: Payer: Self-pay | Admitting: Neurology

## 2018-08-26 NOTE — Telephone Encounter (Signed)
Looks like neuro office recommended EKG and EEG. Would be happy to do EKG here if he wants, but would recommend that he reach back out to neuro to make sure that is ok.  [name]. [name], MD 08/26/2018 5:11 PM

## 2018-08-26 NOTE — Telephone Encounter (Signed)
See note  Copied from CRM [id number]. Topic: Quick Communication - See Telephone Encounter >> Aug 26, 2018 12:33 PM Luanna Cole wrote: CRM for notification. See Telephone encounter for: 08/26/18. Pt called and stated that his neurologist suggested that he schedule an EKG.  Please advise

## 2018-08-26 NOTE — Telephone Encounter (Signed)
Please advise 

## 2018-08-26 NOTE — Telephone Encounter (Signed)
Patient is experiencing a change in his condition and would like to speak with the nurse about it. Please call him back at [phone]. Thanks!

## 2018-08-27 ENCOUNTER — Ambulatory Visit: Payer: Self-pay | Admitting: *Deleted

## 2018-08-27 ENCOUNTER — Ambulatory Visit (INDEPENDENT_AMBULATORY_CARE_PROVIDER_SITE_OTHER): Payer: BLUE CROSS/BLUE SHIELD | Admitting: Neurology

## 2018-08-27 DIAGNOSIS — R404 Transient alteration of awareness: Secondary | ICD-10-CM

## 2018-08-27 NOTE — Telephone Encounter (Signed)
  Pt called in concerned about his pulse being 48-53.    I asked what prompted him to check his pulse, was he having some kind of symptoms.   He said,  "No".   "I just decided to check my pulse".   "I'm not having any symptoms".   "I just completed a sleep deprived EEG last night".  He then let me know his neurologist wanted him to make an appt with his PCP for an EKG to r/o cardiac issues that may have caused him to have his first seizure 2 weeks ago.   He denies chest pain, shortness of breath, breaking out in sweats.    I scheduled him with Dr. [name] for 08/28/18 at 1:40.   I instructed him to go to the ED if his pulse dropped lower or he became symptomatic with the above symptoms.   He verbalized understanding.   Reason for Disposition . [1] Palpitations AND [2] no improvement after using CARE ADVICE    Concerned about a slow heart rate.   Neurologist wants him to have an EKG after having a seizure 2 weeks ago.   New onset.  Answer Assessment - Initial Assessment Questions 1. DESCRIPTION: "Please describe your heart rate or heart beat that you are having" (e.g., fast/slow, regular/irregular, skipped or extra beats, "palpitations")     I was wondering if 48-53 pulse is abnormal.   I had a sleep deprived EEG this morning.    I've been resting.    2. ONSET: "When did it start?" (Minutes, hours or days)      I took it a few minutes ago.     3. DURATION: "How long does it last" (e.g., seconds, minutes, hours)     Staying long 4. PATTERN "Does it come and go, or has it been constant since it started?"  "Does it get worse with exertion?"   "Are you feeling it now?"     It stays this way.    I'm supposed to be scheduled for a EKG.    I had a seizure a couple of weeks ago.    My neurology doctor told me to call my PCP and make an appt. 5. TAP: "Using your hand, can you tap out what you are feeling on a chair or table in front of you, so that I can hear?" (Note: not all patients can do this)       See  above 6. HEART RATE: "Can you tell me your heart rate?" "How many beats in 15 seconds?"  (Note: not all patients can do this)       See above 7. RECURRENT SYMPTOM: "Have you ever had this before?" If so, ask: "When was the last time?" and "What happened that time?"      No   8. CAUSE: "What do you think is causing the palpitations?"     I don't know. 9. CARDIAC HISTORY: "Do you have any history of heart disease?" (e.g., heart attack, angina, bypass surgery, angioplasty, arrhythmia)      No.     10. OTHER SYMPTOMS: "Do you have any other symptoms?" (e.g., dizziness, chest pain, sweating, difficulty breathing)       No 11. PREGNANCY: "Is there any chance you are pregnant?" "When was your last menstrual period?"       N/A  Protocols used: HEART RATE AND HEARTBEAT QUESTIONS-A-AH

## 2018-08-27 NOTE — Procedures (Signed)
ELECTROENCEPHALOGRAM REPORT  Date of Study: 08/27/2018  Patient's Name: Omar Krause MRN: [id number] Date of Birth: June 13, 1991  Referring Provider: Dr. Patrcia Dolly  Clinical History: This is a 27 year old man with an episode of loss of time after a panic attack, confusion afterward.  Medications: Seroquel, Atarax, Valium, multivitamin  Technical Summary: A multichannel digital 1-hour sleep-deprived EEG recording measured by the international 10-20 system with electrodes applied with paste and impedances below 5000 ohms performed in our laboratory with EKG monitoring in an awake and asleep patient.  Hyperventilation and photic stimulation were performed.  The digital EEG was referentially recorded, reformatted, and digitally filtered in a variety of bipolar and referential montages for optimal display.    Description: The patient is awake and asleep during the recording.  During maximal wakefulness, there is a symmetric, medium voltage 10-10.5 Hz posterior dominant rhythm that attenuates with eye opening.  The record is symmetric.  During drowsiness and sleep, there is an increase in theta slowing of the background.  Vertex waves and symmetric sleep spindles were seen.  Hyperventilation and photic stimulation did not elicit any abnormalities.  There were no epileptiform discharges or electrographic seizures seen.    EKG lead was unremarkable.  Impression: This 1-hour awake and asleep EEG is normal.    Clinical Correlation: A normal EEG does not exclude a clinical diagnosis of epilepsy.  If further clinical questions remain, prolonged EEG may be helpful.  Clinical correlation is advised.   Patrcia Dolly, M.D.

## 2018-08-27 NOTE — Telephone Encounter (Signed)
Would you mind calling this patient to get him scheduled for an office visit at his convenience?  Thank you!

## 2018-08-27 NOTE — Telephone Encounter (Signed)
*  LATE DOCUMENTATION*  Pt was in the office this morning for sleep deprived EEG.  I spoke with him.  He states that he has had 2 instances where he thought and heard himself say 1 thing but was told that he said another.  I asked for an examples - once time he told his mother "hey look at that blue car" his mother heard him say blue, but the car was green and he heard himself say "look at that green car".  Another example was that he called his dog by the wrong name.  Dog's name is [name] (named after a friend of pt's) but he called the dog [place] (another friend of pt's).

## 2018-08-27 NOTE — Telephone Encounter (Signed)
Noted. I agree with plan. Low threshold to go to ED if symptoms develop.

## 2018-08-28 ENCOUNTER — Other Ambulatory Visit: Payer: Self-pay

## 2018-08-28 ENCOUNTER — Ambulatory Visit: Payer: BLUE CROSS/BLUE SHIELD | Admitting: Family Medicine

## 2018-08-28 ENCOUNTER — Encounter: Payer: Self-pay | Admitting: Family Medicine

## 2018-08-28 VITALS — BP 142/84 | HR 95 | Temp 98.3°F | Ht 72.0 in | Wt 317.0 lb

## 2018-08-28 DIAGNOSIS — F411 Generalized anxiety disorder: Secondary | ICD-10-CM

## 2018-08-28 DIAGNOSIS — F321 Major depressive disorder, single episode, moderate: Secondary | ICD-10-CM

## 2018-08-28 DIAGNOSIS — R002 Palpitations: Secondary | ICD-10-CM | POA: Diagnosis not present

## 2018-08-28 DIAGNOSIS — F4310 Post-traumatic stress disorder, unspecified: Secondary | ICD-10-CM

## 2018-08-28 DIAGNOSIS — R404 Transient alteration of awareness: Secondary | ICD-10-CM

## 2018-08-28 MED ORDER — HYDROXYZINE HCL 50 MG PO TABS: 100.0000 mg | ORAL_TABLET | Freq: Three times a day (TID) | ORAL | 1 refills | Status: DC | PRN

## 2018-08-28 NOTE — Assessment & Plan Note (Signed)
Stable.  Continue Seroquel.  Refilled hydroxyzine.  Defer further management to psychiatry.

## 2018-08-28 NOTE — Patient Instructions (Signed)
It was very nice to see you today!  Your EKG today is normal.   We will refill your hydroxyzine.  Please look into meeting with a disability lawyer to explore your options with disability.  Take care, Dr [name]

## 2018-08-28 NOTE — Assessment & Plan Note (Addendum)
Not fully addressed today due to more pressing concerns.  BMI 43 today.  Down 4 pounds since his last office visit.  Lifestyle modifications including healthy diet and regular exercise.

## 2018-08-28 NOTE — Assessment & Plan Note (Signed)
Stable.  Continue Seroquel.  Hydroxyzine refilled.  No further management to psychiatry.  Advised patient to seek out a lawyer to discuss disability.  Patient voiced understanding.

## 2018-08-28 NOTE — Progress Notes (Signed)
   Subjective:  Omar Krause is a 27 y.o. male who presents today with a chief complaint of palpitations.   HPI:  Palpitation, chronic problem Patient was recently seen for seizure-like activity.  He was refered to neurology for further evaluation.  He has not had any recurrent seizure-like activity since his last episode, however has noted that he has had a few low heart rates into the 40s over the last few weeks.  He has had a history of palpitations in the past as well.  Patient's neurologist requested him to get an EKG for further cardiac evaluation.  Does not currently have any symptoms.  No chest pain or shortness of breath.  No palpitations.  He has had a few episodes of mixing up words over the last few weeks.  No reported weakness or numbness.  Anxiety/depression/PTSD Sees psychiatry for this.  Symptoms are improving on Seroquel 300 mg daily.  Was recently started on Lexapro which she had to stop due to side effects.  He has had significant social anxiety resulting in difficulty finding work.  Additionally, he has been restricted from driving for the next 6 months due to his seizure-like activity.  ROS: Per HPI  PMH: He reports that he has never smoked. He has never used smokeless tobacco. He reports that he has current or past drug history. Drug: Marijuana. He reports that he does not drink alcohol.  Objective:  Physical Exam: BP (!) 142/84 (BP Location: Left Arm, Patient Position: Sitting, Cuff Size: Large)   Pulse 95   Temp 98.3 F (36.8 C) (Oral)   Ht 6' (1.829 m)   Wt (!) 317 lb (143.8 kg)   SpO2 99%   BMI 42.99 kg/m   Wt Readings from Last 3 Encounters:  08/28/18 (!) 317 lb (143.8 kg)  08/18/18 (!) 321 lb (145.6 kg)  08/11/18 (!) 314 lb 4 oz (142.5 kg)  Gen: NAD, resting comfortably CV: RRR with no murmurs appreciated Pulm: NWOB, CTAB with no crackles, wheezes, or rhonchi MSK: No edema, cyanosis, or clubbing noted Skin: Warm, dry Neuro: Grossly normal, moves all  extremities Psych: Normal affect and thought content  EKG: Normal sinus rhythm.  No ischemic changes.  Assessment/Plan:  PTSD (post-traumatic stress disorder) Stable.  Continue Seroquel.  Refilled hydroxyzine.  Defer further management to psychiatry.  Morbid obesity Not fully addressed today due to more pressing concerns.  BMI 43 today.  Down 4 pounds since his last office visit.  Lifestyle modifications including healthy diet and regular exercise.  Generalized anxiety disorder Stable.  Continue Seroquel.  Hydroxyzine refilled.  No further management to psychiatry.  Advised patient to seek out a lawyer to discuss disability.  Patient voiced understanding.  Palpitations Normal EKG.  Reassured patient.  Given that he has been asymptomatic and his EKG is normal, do not think need to pursue further work-up at this point.  Discussed reasons to return to care including repeat syncopal-like episodes or development of symptoms such as chest pain or shortness of breath.  Would consider referral to cardiology for prolonged monitoring if this occurs.  [name]. [name], MD 08/28/2018 3:14 PM

## 2018-09-01 ENCOUNTER — Telehealth: Payer: Self-pay | Admitting: *Deleted

## 2018-09-01 NOTE — Telephone Encounter (Signed)
Yes, okay to start with MRI brain.  CTA head can be ordered, if MRA is getting denied after his initial results come back. Thanks.

## 2018-09-01 NOTE — Telephone Encounter (Signed)
Danelle Earthly from [facility] called.  Patient's MRI has been approved but the MRA was not.  Ok to proceed with the MRI only?

## 2018-09-01 NOTE — Telephone Encounter (Signed)
Left message notifying Danelle Earthly.

## 2018-09-02 ENCOUNTER — Telehealth: Payer: Self-pay | Admitting: *Deleted

## 2018-09-02 ENCOUNTER — Ambulatory Visit
Admission: RE | Admit: 2018-09-02 | Discharge: 2018-09-02 | Disposition: A | Discharge status: Home / Self Care | Payer: BLUE CROSS/BLUE SHIELD | Source: Ambulatory Visit | Attending: Neurology | Admitting: Neurology

## 2018-09-02 ENCOUNTER — Inpatient Hospital Stay: Admission: RE | Admit: 2018-09-02 | Payer: Self-pay | Source: Ambulatory Visit

## 2018-09-02 ENCOUNTER — Other Ambulatory Visit: Payer: Self-pay

## 2018-09-02 DIAGNOSIS — R404 Transient alteration of awareness: Secondary | ICD-10-CM

## 2018-09-02 MED ORDER — GADOBENATE DIMEGLUMINE 529 MG/ML IV SOLN
20.0000 mL | Freq: Once | INTRAVENOUS | Status: AC | PRN
  Administered 2018-09-02: 20 mL via INTRAVENOUS

## 2018-09-02 NOTE — Telephone Encounter (Signed)
LMOM to call to set up appointment for 48 hour ambulatory EEG. Offered Dec 2nd at 7:30 return Dec 4th at 8:30, Dec 9th at 7:30 or 9:00 and return Dec 11th at 8:30, 10:00 respectively.

## 2018-09-07 ENCOUNTER — Encounter: Payer: Self-pay | Admitting: Family Medicine

## 2018-09-09 ENCOUNTER — Ambulatory Visit (HOSPITAL_COMMUNITY): Payer: Self-pay | Admitting: Licensed Clinical Social Worker

## 2018-09-20 ENCOUNTER — Other Ambulatory Visit: Payer: Self-pay | Admitting: Family Medicine

## 2018-09-29 ENCOUNTER — Other Ambulatory Visit: Payer: Self-pay | Admitting: Neurology

## 2018-09-29 ENCOUNTER — Telehealth: Payer: Self-pay | Admitting: Family Medicine

## 2018-09-29 NOTE — Telephone Encounter (Signed)
Copied from CRM [id number]. Topic: Quick Communication - See Telephone Encounter >> Sep 29, 2018 10:37 AM Arlyss Gandyichardson, Rhyann Berton N, NT wrote: CRM for notification. See Telephone encounter for: 09/29/18. Pt states that his insurance policy was cancelled and he is wanting to see if he can get instructions on how to wean off the QUEtiapine (SEROQUEL) 100 MG tablet or if something else can be ordered as he cannot afford this medication.

## 2018-09-30 NOTE — Telephone Encounter (Signed)
We can send in 300mg  tablets. This will be $9 for a month at [facility].  Katina Degreealeb M. Jimmey RalphParker, MD 09/30/2018 12:00 PM

## 2018-09-30 NOTE — Telephone Encounter (Signed)
Please advise 

## 2018-09-30 NOTE — Telephone Encounter (Signed)
Pt states that his insurance policy was cancelled.  Pt requesting instructions on how to wean off the QUEtiapine (SEROQUEL) 100 MG tablet or if something else can be ordered as he cannot afford this medication.

## 2018-10-03 ENCOUNTER — Other Ambulatory Visit: Payer: Self-pay

## 2018-10-03 MED ORDER — QUETIAPINE FUMARATE 300 MG PO TABS: 300.0000 mg | ORAL_TABLET | Freq: Every day | ORAL | 3 refills | Status: DC

## 2018-10-03 NOTE — Telephone Encounter (Signed)
LM for patient to return call.  I also reached out to this patient several days ago via MyChart and have not received a response yet.

## 2018-10-03 NOTE — Telephone Encounter (Signed)
Received response via MyChart and have sent [facility] to [facility] [place]

## 2018-10-07 ENCOUNTER — Encounter: Payer: Self-pay | Admitting: Family Medicine

## 2018-10-09 ENCOUNTER — Ambulatory Visit (HOSPITAL_COMMUNITY): Payer: BLUE CROSS/BLUE SHIELD | Admitting: Psychiatry

## 2018-10-28 ENCOUNTER — Other Ambulatory Visit: Payer: Self-pay | Admitting: Family Medicine

## 2018-10-28 NOTE — Telephone Encounter (Signed)
Rx request 

## 2018-10-31 ENCOUNTER — Other Ambulatory Visit: Payer: Self-pay

## 2018-10-31 MED ORDER — DIAZEPAM 5 MG PO TABS: 5.0000 mg | ORAL_TABLET | Freq: Two times a day (BID) | ORAL | 2 refills | Status: DC | PRN

## 2018-10-31 NOTE — Progress Notes (Signed)
Rx has been cancelled.

## 2018-10-31 NOTE — Progress Notes (Signed)
Patient would like this sent to [facility].  It was sent to [facility] instead.

## 2018-10-31 NOTE — Progress Notes (Signed)
Sent in. Please cancel rx at [facility].  [name]. [name], MD 10/31/2018 4:12 PM

## 2018-11-03 ENCOUNTER — Encounter: Payer: Self-pay | Admitting: Family Medicine

## 2018-11-14 ENCOUNTER — Ambulatory Visit: Payer: Self-pay | Admitting: Family Medicine

## 2018-11-14 ENCOUNTER — Encounter: Payer: Self-pay | Admitting: Family Medicine

## 2018-11-14 VITALS — BP 136/82 | HR 86 | Temp 97.8°F | Ht 72.0 in | Wt 330.2 lb

## 2018-11-14 DIAGNOSIS — F321 Major depressive disorder, single episode, moderate: Secondary | ICD-10-CM

## 2018-11-14 DIAGNOSIS — F411 Generalized anxiety disorder: Secondary | ICD-10-CM

## 2018-11-14 DIAGNOSIS — F4310 Post-traumatic stress disorder, unspecified: Secondary | ICD-10-CM

## 2018-11-14 MED ORDER — QUETIAPINE FUMARATE 300 MG PO TABS: 300.0000 mg | ORAL_TABLET | Freq: Every day | ORAL | 0 refills | Status: DC

## 2018-11-14 MED ORDER — RISPERIDONE 3 MG PO TABS: 3.0000 mg | ORAL_TABLET | Freq: Every day | ORAL | 3 refills | Status: DC

## 2018-11-14 NOTE — Patient Instructions (Addendum)
It was very nice to see you today!  We will start risperidone. Please take half of a tablet daily for the next week.  Please take half of the tablet of your Seroquel for the next week.  After that, please stop Seroquel completely the risperidone.  Please let me know if your symptoms do not improve with this or if you have any adverse reactions.  Take care, Dr Jimmey RalphParker

## 2018-11-14 NOTE — Assessment & Plan Note (Signed)
We will cross titrate Seroquel and risperidone as noted above.  He will continue Valium and hydroxyzine as well.  Discussed reasons to seek emergent care and return to care.  Recommended close follow-up with psychiatry.

## 2018-11-14 NOTE — Assessment & Plan Note (Signed)
Symptoms for his PTSD overall seem to be stable.  We will cross titrate his Seroquel with Respinol may see below problems.  Recommended that he follow-up with psychiatry closely.  Discussed reasons to return to care and seek emergent care.

## 2018-11-14 NOTE — Assessment & Plan Note (Addendum)
We will cross titrate Seroquel with risperidone to see if he has better tolerance to this without side effects.  We will decrease Seroquel to 150 mg daily for 1 week, then stop completely.  We will start Risperdal 1.5 mg daily for 1 week, then increase to 3 mg daily.  Recommended close follow-up with psychiatry.  He will continue his Valium and hydroxyzine as needed.

## 2018-11-14 NOTE — Progress Notes (Signed)
   Chief Complaint:  Omar Krause is a 28 y.o. male who presents today with a chief complaint of depression and anxiety follow-up.   Assessment/Plan:  PTSD (post-traumatic stress disorder) Symptoms for his PTSD overall seem to be stable.  We will cross titrate his Seroquel with Respinol may see below problems.  Recommended that he follow-up with psychiatry closely.  Discussed reasons to return to care and seek emergent care.  Generalized anxiety disorder We will cross titrate Seroquel with risperidone to see if he has better tolerance to this without side effects.  We will decrease Seroquel to 150 mg daily for 1 week, then stop completely.  We will start Risperdal 1.5 mg daily for 1 week, then increase to 3 mg daily.  Recommended close follow-up with psychiatry.  He will continue his Valium and hydroxyzine as needed.  Depression, major, single episode, moderate (HCC) We will cross titrate Seroquel and risperidone as noted above.  He will continue Valium and hydroxyzine as well.  Discussed reasons to seek emergent care and return to care.  Recommended close follow-up with psychiatry.  Subjective   Subjective:  HPI:  Depression / Anxiety / PTSD Patient was last seen for these issues about 3 months ago.  At that time he was referred to psychiatry.  He had previously been on Seroquel 300 mg nightly, Lexapro 5 mg daily, and Valium/hydroxyzine as needed.  He did moderately well on this however over the last couple weeks has noticed significant worsening of his symptoms.  He now notes that he has excessive worry after taking his Seroquel.  He was started to have feelings of extreme dread and feels very anxious during this time.  He has also started developing some feelings of Agoraphobia and fear of public spaces.  Previously, like this medication regimen is working well for him.  He otherwise has not had any side effects with his medications.  He is interested in possibly trying another atypical  antipsychotic today.  He has stopped the Lexapro as he did not think it was helping.  This did not have any change in his overall mood or symptoms.  He recently lost his insurance status.  He is currently trying to regain insurance and is looking for a new psychiatrist in the area.  ROS: Per HPI  PMH: He reports that he has never smoked. He has never used smokeless tobacco. He reports current drug use. Drug: Marijuana. He reports that he does not drink alcohol.    Objective   Objective:  Physical Exam: BP 136/82 (BP Location: Left Arm, Patient Position: Sitting, Cuff Size: Large)   Pulse 86   Temp 97.8 F (36.6 C) (Oral)   Ht 6' (1.829 m)   Wt (!) 330 lb 3.2 oz (149.8 kg)   SpO2 97%   BMI 44.78 kg/m   Gen: NAD, resting comfortably CV: Regular rate and rhythm with no murmurs appreciated Pulm: Normal work of breathing, clear to auscultation bilaterally with no crackles, wheezes, or rhonchi Psych: Normal affect and thought content     Arlie Posch M. [name], MD 11/14/2018 5:02 PM

## 2018-11-27 ENCOUNTER — Telehealth: Payer: Self-pay | Admitting: Family Medicine

## 2018-11-27 ENCOUNTER — Other Ambulatory Visit: Payer: Self-pay

## 2018-11-27 ENCOUNTER — Encounter: Payer: Self-pay | Admitting: Family Medicine

## 2018-11-27 MED ORDER — VENLAFAXINE HCL ER 75 MG PO TB24: 75.0000 mg | ORAL_TABLET | Freq: Every day | ORAL | 3 refills | Status: DC

## 2018-11-27 MED ORDER — VENLAFAXINE HCL ER 75 MG PO CP24: 75.0000 mg | ORAL_CAPSULE | Freq: Every day | ORAL | 3 refills | Status: DC

## 2018-11-27 NOTE — Telephone Encounter (Signed)
Please see message and advise 

## 2018-11-27 NOTE — Telephone Encounter (Signed)
Ok with me. Please place any necessary orders. 

## 2018-11-27 NOTE — Telephone Encounter (Signed)
Rx sent to pharmacy   

## 2018-11-27 NOTE — Telephone Encounter (Signed)
See note

## 2018-11-27 NOTE — Telephone Encounter (Signed)
Copied from CRM [id number]. Topic: Quick Communication - Rx Refill/Question >> Nov 27, 2018  4:08 PM Baldo Daub L wrote: Medication: Venlafaxine HCl 75 MG Tb24  States he is uninsured and this is on the $4 list at [facility] if it is called in as capsule not tablet  Has the patient contacted their pharmacy? Yes - needs a change so he can get off the $4 list (Agent: If no, request that the patient contact the pharmacy for the refill.) (Agent: If yes, when and what did the pharmacy advise?)  Preferred Pharmacy (with phone number or street name): [facility] [address] [place] - [address] [phone] (Phone) [phone] (Fax)  Agent: Please be advised that RX refills may take up to 3 business days. We ask that you follow-up with your pharmacy.

## 2018-12-06 ENCOUNTER — Encounter: Payer: Self-pay | Admitting: Family Medicine

## 2018-12-09 ENCOUNTER — Other Ambulatory Visit: Payer: Self-pay

## 2018-12-09 MED ORDER — GABAPENTIN 100 MG PO CAPS: 100.0000 mg | ORAL_CAPSULE | Freq: Three times a day (TID) | ORAL | 3 refills | Status: DC | PRN

## 2018-12-15 ENCOUNTER — Ambulatory Visit: Payer: Self-pay | Admitting: Neurology

## 2018-12-29 ENCOUNTER — Encounter: Payer: Self-pay | Admitting: Family Medicine

## 2018-12-31 ENCOUNTER — Other Ambulatory Visit: Payer: Self-pay

## 2018-12-31 MED ORDER — OLANZAPINE 10 MG PO TABS: 10.0000 mg | ORAL_TABLET | Freq: Every day | ORAL | 1 refills | Status: DC

## 2019-01-16 ENCOUNTER — Ambulatory Visit (INDEPENDENT_AMBULATORY_CARE_PROVIDER_SITE_OTHER): Payer: Self-pay | Admitting: Family Medicine

## 2019-01-16 ENCOUNTER — Ambulatory Visit: Payer: Self-pay | Admitting: Family Medicine

## 2019-01-16 ENCOUNTER — Other Ambulatory Visit: Payer: Self-pay

## 2019-01-16 ENCOUNTER — Encounter: Payer: Self-pay | Admitting: Family Medicine

## 2019-01-16 DIAGNOSIS — F321 Major depressive disorder, single episode, moderate: Secondary | ICD-10-CM

## 2019-01-16 DIAGNOSIS — F411 Generalized anxiety disorder: Secondary | ICD-10-CM

## 2019-01-16 DIAGNOSIS — G4700 Insomnia, unspecified: Secondary | ICD-10-CM

## 2019-01-16 DIAGNOSIS — F4310 Post-traumatic stress disorder, unspecified: Secondary | ICD-10-CM

## 2019-01-16 MED ORDER — CLONAZEPAM 1 MG PO TABS: 1.0000 mg | ORAL_TABLET | Freq: Two times a day (BID) | ORAL | 0 refills | Status: DC

## 2019-01-16 NOTE — Assessment & Plan Note (Signed)
Overall stable.  We will switch Valium to Klonopin.  Continue Zyprexa 10 mg daily and Effexor 75 mg daily.  Follow-up in 4 to 6 weeks.

## 2019-01-16 NOTE — Progress Notes (Signed)
    Chief Complaint:  Omar Krause is a 28 y.o. male who presents today for a virtual office visit with a chief complaint of cough.   Assessment/Plan:  Cough Patient has several symptoms concerning for possible COVID-19 including dry cough, fever, chills, myalgias, and sore throat.  Recommended patient self isolate for the next 14 days for presumed COVID-19 infection.  Patient voiced understanding.  Recommended over-the-counter acetaminophen as needed.  Discussed reasons to return to care and seek emergent care.  PTSD (post-traumatic stress disorder) Overall stable.  We will switch Valium to Klonopin.  Continue Zyprexa 10 mg daily and Effexor 75 mg daily.  Follow-up in 4 to 6 weeks.  Insomnia Continue Zyprexa 10 mg nightly.  Generalized anxiety disorder Overall stable.  Continue Effexor 75 mg daily Zyprexa 10 mg daily, and gabapentin 100 mg 3 times daily as needed.  We will switch from Valium to Klonopin 1 mg twice daily as needed.  Depression, major, single episode, moderate (HCC) Stable.  Continue Effexor 75 mg daily and Zyprexa 10 mg daily.    Subjective:  HPI:  Cough  Symptoms started 2 days ago.  He was at the grocery store 2 weeks ago when he was in contact with somebody who had upper respiratory symptoms.  His current symptoms include cough, fever, myalgias, and shortness of breath.  Patient had approximately 2 minutes of contact with his sick contact. Cough is nonproductive. Took advil which did not help.   Depression / Anxiety / PTSD - On olanzapine 10 mg daily, gabapentin 300 mg 3 times daily as needed, Valium 5 mg every 12 hours as needed, and Effexor 75 mg daily - Overall feels like his mood is okay however does not think the Valium is working quite as well as it was before. - ROS: No reported SI or HI  ROS: Per HPI  PMH: He reports that he has never smoked. He has never used smokeless tobacco. He reports current drug use. Drug: Marijuana. He reports that he does not  drink alcohol.      Objective/Observations  Physical Exam: Gen: NAD, resting comfortably Pulm: Normal work of breathing, speaking in full sentences.  No cough noted on interview. Neuro: Grossly normal, moves all extremities Psych: Normal affect and thought content  Virtual Visit via Video   I connected with Liberato Boakye Krause on 01/16/19 at  4:00 PM EDT by a video enabled telemedicine application and verified that I am speaking with the correct person using two identifiers. I discussed the limitations of evaluation and management by telemedicine and the availability of in person appointments. The patient expressed understanding and agreed to proceed.   Patient location: Home Provider location: [place] [facility] [employer] [facility] Persons participating in the virtual visit: Myself and patient     [name]. [name], MD 01/16/2019 4:19 PM

## 2019-01-16 NOTE — Assessment & Plan Note (Signed)
Continue Zyprexa 10 mg nightly.

## 2019-01-16 NOTE — Telephone Encounter (Signed)
Please schedule patient for Webex.  Thanks!

## 2019-01-16 NOTE — Assessment & Plan Note (Signed)
Overall stable.  Continue Effexor 75 mg daily Zyprexa 10 mg daily, and gabapentin 100 mg 3 times daily as needed.  We will switch from Valium to Klonopin 1 mg twice daily as needed.

## 2019-01-16 NOTE — Assessment & Plan Note (Signed)
Stable.  Continue Effexor 75 mg daily and Zyprexa 10 mg daily.
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
## 2019-01-26 ENCOUNTER — Encounter: Payer: Self-pay | Admitting: Family Medicine

## 2019-01-26 ENCOUNTER — Other Ambulatory Visit: Payer: Self-pay

## 2019-01-26 MED ORDER — OLANZAPINE 15 MG PO TABS: 15.0000 mg | ORAL_TABLET | Freq: Every day | ORAL | 0 refills | Status: DC

## 2019-01-29 ENCOUNTER — Encounter: Payer: Self-pay | Admitting: Family Medicine

## 2019-01-29 ENCOUNTER — Ambulatory Visit (INDEPENDENT_AMBULATORY_CARE_PROVIDER_SITE_OTHER): Payer: Self-pay | Admitting: Family Medicine

## 2019-01-29 VITALS — Ht 72.0 in | Wt 307.2 lb

## 2019-01-29 DIAGNOSIS — F411 Generalized anxiety disorder: Secondary | ICD-10-CM

## 2019-01-29 DIAGNOSIS — T300 Burn of unspecified body region, unspecified degree: Secondary | ICD-10-CM

## 2019-01-29 DIAGNOSIS — F321 Major depressive disorder, single episode, moderate: Secondary | ICD-10-CM

## 2019-01-29 MED ORDER — SILVER SULFADIAZINE 1 % EX CREA: 1.0000 "application " | TOPICAL_CREAM | Freq: Every day | CUTANEOUS | 0 refills | Status: DC

## 2019-01-29 MED ORDER — ALPRAZOLAM 1 MG PO TABS: 1.0000 mg | ORAL_TABLET | Freq: Two times a day (BID) | ORAL | 0 refills | Status: DC | PRN
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
## 2019-01-29 NOTE — Telephone Encounter (Signed)
See note  Copied from CRM [id number]. Topic: General - Other >> Jan 29, 2019 11:28 AM Marylen Ponto wrote: Reason for CRM: Pt stated he was told to call back if his pharmacy would not fill the Rx for ALPRAZolam Prudy Feeler) 1 MG tablet. Pt stated [facility] would not fill the Rx because he had previously gotten another medication. Pt requests call back. Cb# [phone]

## 2019-01-29 NOTE — Assessment & Plan Note (Signed)
Discussed treatment options.  We will switch to Xanax 1 mg twice daily as needed.  Continue Effexor 75 mg daily and Zyprexa 15 mg daily.  Follow-up in a few weeks.  Consider increasing dose of Effexor or Zyprexa if symptoms are not well controlled.

## 2019-02-15 ENCOUNTER — Encounter: Payer: Self-pay | Admitting: Family Medicine

## 2019-02-17 ENCOUNTER — Ambulatory Visit (INDEPENDENT_AMBULATORY_CARE_PROVIDER_SITE_OTHER): Payer: Self-pay | Admitting: Family Medicine

## 2019-02-17 ENCOUNTER — Encounter: Payer: Self-pay | Admitting: Family Medicine

## 2019-02-17 DIAGNOSIS — F411 Generalized anxiety disorder: Secondary | ICD-10-CM

## 2019-02-17 MED ORDER — OLANZAPINE 20 MG PO TABS: 20.0000 mg | ORAL_TABLET | Freq: Every day | ORAL | 3 refills | Status: DC

## 2019-02-17 MED ORDER — VENLAFAXINE HCL ER 150 MG PO CP24: 150.0000 mg | ORAL_CAPSULE | Freq: Every day | ORAL | 3 refills | Status: DC

## 2019-02-17 MED ORDER — ALPRAZOLAM 1 MG PO TABS: 1.0000 mg | ORAL_TABLET | Freq: Two times a day (BID) | ORAL | 5 refills | Status: DC | PRN

## 2019-02-17 NOTE — Assessment & Plan Note (Signed)
Unfortunately symptoms are still not well controlled.  Will increase his dose of Zyprexa to 20 mg daily.  He will continue taking Effexor 150 mg daily.  He will also start Xanax 1 mg twice daily scheduled for the next 2 weeks.  He will follow-up with me in the next couple of weeks.  Would consider increasing dose of Effexor to 225 mg daily if symptoms are poorly controlled.

## 2019-02-17 NOTE — Progress Notes (Signed)
   Chief Complaint:  Omar Krause is a 28 y.o. male who presents today for a virtual office visit with a chief complaint of anxiety.   Assessment/Plan:  Generalized anxiety disorder Unfortunately symptoms are still not well controlled.  Will increase his dose of Zyprexa to 20 mg daily.  He will continue taking Effexor 150 mg daily.  He will also start Xanax 1 mg twice daily scheduled for the next 2 weeks.  He will follow-up with me in the next couple of weeks.  Would consider increasing dose of Effexor to 225 mg daily if symptoms are poorly controlled.    Subjective:  HPI:  Anxiety  On Effexor 150 mg daily, Zyprexa 15 mg daily, and Xanax 1 mg twice daily as needed.  Overall symptoms had done well until starting a new job earlier this week.  States that he had severe panic attack while at work that lasted for several hours.  Symptoms eventually resolved.  He has been compliant with his medications without side effects.  He is interested in increasing the dose.  No obvious triggering factors for his panic attack.  States that is the worst 1 that he is ever had.  He consider going to the hospital due to its severity.  ROS: Per HPI  PMH: He reports that he has never smoked. He has never used smokeless tobacco. He reports current drug use. Drug: Marijuana. He reports that he does not drink alcohol.      Objective/Observations  Physical Exam: Gen: NAD, resting comfortably Pulm: Normal work of breathing Neuro: Grossly normal, moves all extremities Psych: Normal affect and thought content  Virtual Visit via Video   I connected with Omar Krause on 02/17/19 at 10:40 AM EDT by a video enabled telemedicine application and verified that I am speaking with the correct person using two identifiers. I discussed the limitations of evaluation and management by telemedicine and the availability of in person appointments. The patient expressed understanding and agreed to proceed.   Patient  location: Home Provider location: [place] [facility] Persons participating in the virtual visit: Myself and Patient     [name]. [name], MD 02/17/2019 12:25 PM

## 2019-03-02 ENCOUNTER — Encounter: Payer: Self-pay | Admitting: Family Medicine

## 2019-03-03 ENCOUNTER — Other Ambulatory Visit: Payer: Self-pay

## 2019-03-03 MED ORDER — VENLAFAXINE HCL ER 75 MG PO CP24: ORAL_CAPSULE | ORAL | 1 refills | Status: DC

## 2019-03-07 ENCOUNTER — Encounter: Payer: Self-pay | Admitting: Family Medicine

## 2019-03-11 ENCOUNTER — Ambulatory Visit: Payer: Self-pay | Admitting: *Deleted

## 2019-03-11 ENCOUNTER — Encounter: Payer: Self-pay | Admitting: Physician Assistant

## 2019-03-11 ENCOUNTER — Ambulatory Visit (INDEPENDENT_AMBULATORY_CARE_PROVIDER_SITE_OTHER): Payer: Self-pay | Admitting: Physician Assistant

## 2019-03-11 DIAGNOSIS — T50905A Adverse effect of unspecified drugs, medicaments and biological substances, initial encounter: Secondary | ICD-10-CM

## 2019-03-11 NOTE — Telephone Encounter (Signed)
Please contact patient to schedule virtual visit to discuss and advise.

## 2019-03-11 NOTE — Telephone Encounter (Signed)
Pt has nausea, vomiting and diarrhea after taking venlafaxine, medication that was increased by his provider. Sometimes he would be so nauseated that he is not able to get out of the bed and vomit in the bed. He wanted his pcp to be aware of these symptoms. Notified LB at [facility] regarding pt. Will route to the office and request return call to the patient. Pt voiced understanding.  Reason for Disposition . Caller has URGENT medication question about med that PCP prescribed and triager unable to answer question  Answer Assessment - Initial Assessment Questions 1. SYMPTOMS: "Do you have any symptoms?"     yes 2. SEVERITY: If symptoms are present, ask "Are they mild, moderate or severe?"     Severe  Protocols used: MEDICATION QUESTION CALL-A-AH

## 2019-03-11 NOTE — Progress Notes (Signed)
Virtual Visit via Video   I connected with Omar Krause on 03/11/19 at  2:00 PM EDT by a video enabled telemedicine application and verified that I am speaking with the correct person using two identifiers. Location patient: Home Location provider: [facility], Office Persons participating in the virtual visit: Omar Krause, Jarold MottoSamantha Jurney Overacker PA-C  I discussed the limitations of evaluation and management by telemedicine and the availability of in person appointments. The patient expressed understanding and agreed to proceed.  Subjective:   HPI:   Medication Issue Reports n/v/d after taking venlafaxine, recently increased from 150 mg daily to 225 mg daily on 03/04/2019 under the direction of his PCP. He states that a couple of hours after taking this medication he develops abdominal cramping, nausea, vomiting and diarrhea. The symptoms resolve daily, but return after taking his medication the next day. He takes his medication with food. He denies any other medication changes at this time. He denies prior intolerance of this medication. He also denies abdominal pain, blood in stool/emesis, fever, dizziness, lightheadedness. He is hydrating well.  ROS: See pertinent positives and negatives per HPI.  Patient Active Problem List   Diagnosis Date Noted  . PTSD (post-traumatic stress disorder) 07/16/2018  . Carpal tunnel syndrome of right wrist 05/02/2018  . Depression, major, single episode, moderate (HCC) 03/31/2018  . Insomnia 03/12/2016  . Marijuana use 03/12/2016  . Asthma with acute exacerbation 09/14/2015  . HTN (hypertension) 02/02/2014  . Generalized anxiety disorder 02/02/2014  . Morbid obesity (HCC) 07/08/2013    Social History   Tobacco Use  . Smoking status: Never Smoker  . Smokeless tobacco: Never Used  Substance Use Topics  . Alcohol use: No    Frequency: Never    Current Outpatient Medications:  .  ALPRAZolam (XANAX) 1 MG tablet, Take 1 tablet (1 mg total) by  mouth 2 (two) times daily as needed for anxiety., Disp: 60 tablet, Rfl: 5 .  gabapentin (NEURONTIN) 100 MG capsule, Take 1 capsule (100 mg total) by mouth 3 (three) times daily as needed., Disp: 90 capsule, Rfl: 3 .  Multiple Vitamin (MULTIVITAMIN WITH MINERALS) TABS tablet, Take 1 tablet by mouth daily., Disp: , Rfl:  .  OLANZapine (ZYPREXA) 20 MG tablet, Take 1 tablet (20 mg total) by mouth at bedtime., Disp: 30 tablet, Rfl: 3 .  silver sulfADIAZINE (SILVADENE) 1 % cream, Apply 1 application topically daily., Disp: 50 g, Rfl: 0 .  venlafaxine XR (EFFEXOR-XR) 150 MG 24 hr capsule, Take 1 capsule (150 mg total) by mouth daily with breakfast., Disp: 30 capsule, Rfl: 3  Allergies  Allergen Reactions  . Ceftin [Cefuroxime Axetil] Itching and Other (See Comments)    Dizziness as well  . Prednisone     Worsens anxiety, causes panic attacks and insomnia.     Objective:   VITALS: Per patient if applicable, see vitals. GENERAL: Alert, appears well and in no acute distress. HEENT: Atraumatic, conjunctiva clear, no obvious abnormalities on inspection of external nose and ears. NECK: Normal movements of the head and neck. CARDIOPULMONARY: No increased WOB. Speaking in clear sentences. I:E ratio WNL.  MS: Moves all visible extremities without noticeable abnormality. PSYCH: Pleasant and cooperative, well-groomed. Speech normal rate and rhythm. Affect is appropriate. Insight and judgement are appropriate. Attention is focused, linear, and appropriate.  NEURO: CN grossly intact. Oriented as arrived to appointment on time with no prompting. Moves both UE equally.  SKIN: No obvious lesions, wounds, erythema, or cyanosis noted on face  or hands.  Assessment and Plan:   Diagnoses and all orders for this visit:  Adverse effect of drug, initial encounter   Suspect that patient is unable to tolerate increased dose of Effexor 225 mg, with no improvement of symptoms x 1 week. I offered nausea medication  however he declined. I recommended resuming prior dose of Effexor 150 mg immediately. Push fluids, eat bland foods, and rest. If any further symptoms arise, please contact us. Follow-up appointment made with PCP to discuss next steps on 03/17/2019 at 2pm.  . Reviewed expectations re: course of current medical issues. . Discussed self-management of symptoms. . Outlined signs and symptoms indicating need for more acute intervention. . Patient verbalized understanding and all questions were answered. Marland Kitchen Health Maintenance issues including appropriate healthy diet, exercise, and smoking avoidance were discussed with patient. . See orders for this visit as documented in the electronic medical record.  I discussed the assessment and treatment plan with the patient. The patient was provided an opportunity to ask questions and all were answered. The patient agreed with the plan and demonstrated an understanding of the instructions.   The patient was advised to call back or seek an in-person evaluation if the symptoms worsen or if the condition fails to improve as anticipated.   CMA or LPN served as scribe during this visit. History, Physical, and Plan performed by medical provider. The above documentation has been reviewed and is accurate and complete.  [place], [place] 03/11/2019

## 2019-03-13 ENCOUNTER — Telehealth: Payer: Self-pay | Admitting: Family Medicine

## 2019-03-13 NOTE — Telephone Encounter (Signed)
Copied from CRM [id number]. Topic: General - Other >> Mar 13, 2019  3:19 PM Jaquita Rector A wrote: Reason for CRM: Patient called to request a letter from Dr Jacquiline Doe to state that due to medical conditions he has issues performing job to the full extent. Ph# [phone]

## 2019-03-13 NOTE — Telephone Encounter (Signed)
Left voice message for patient to call clinic. Patient scheduled.

## 2019-03-13 NOTE — Telephone Encounter (Signed)
Spoke with patient he will discuss issues at scheduled appt.

## 2019-03-17 ENCOUNTER — Other Ambulatory Visit: Payer: Self-pay

## 2019-03-17 ENCOUNTER — Ambulatory Visit (INDEPENDENT_AMBULATORY_CARE_PROVIDER_SITE_OTHER): Payer: Self-pay | Admitting: Family Medicine

## 2019-03-17 ENCOUNTER — Other Ambulatory Visit: Payer: Self-pay | Admitting: Family Medicine

## 2019-03-17 ENCOUNTER — Encounter: Payer: Self-pay | Admitting: Family Medicine

## 2019-03-17 DIAGNOSIS — F321 Major depressive disorder, single episode, moderate: Secondary | ICD-10-CM

## 2019-03-17 DIAGNOSIS — F411 Generalized anxiety disorder: Secondary | ICD-10-CM

## 2019-03-17 DIAGNOSIS — J4520 Mild intermittent asthma, uncomplicated: Secondary | ICD-10-CM

## 2019-03-17 MED ORDER — NORTRIPTYLINE HCL 50 MG PO CAPS: 50.0000 mg | ORAL_CAPSULE | Freq: Every day | ORAL | 3 refills | Status: DC

## 2019-03-17 MED ORDER — ALBUTEROL SULFATE HFA 108 (90 BASE) MCG/ACT IN AERS: 2.0000 | INHALATION_SPRAY | Freq: Four times a day (QID) | RESPIRATORY_TRACT | 2 refills | Status: DC | PRN

## 2019-03-17 MED ORDER — CHLORDIAZEPOXIDE HCL 10 MG PO CAPS: 10.0000 mg | ORAL_CAPSULE | Freq: Three times a day (TID) | ORAL | 0 refills | Status: DC | PRN

## 2019-03-17 NOTE — Assessment & Plan Note (Signed)
Slightly worsened.  Will send in albuterol inhaler.

## 2019-03-17 NOTE — Telephone Encounter (Signed)
Please resend.  Was sent to incorrect pharmacy.

## 2019-03-17 NOTE — Assessment & Plan Note (Signed)
See above.  Worsening.  Will switch from Effexor to Pamelor.  He will check in with me in 1-2 weeks.

## 2019-03-17 NOTE — Telephone Encounter (Signed)
Copied from CRM [id number]. Topic: General - Other >> Mar 17, 2019  2:33 PM Jaquita Rector A wrote: Reason for CRM: Patient called to say that he need Rx called in to [facility] [address] - [place], [place] - [address] [phone] (Phone) [phone] (Fax)  They were sent to [facility]. Thank you

## 2019-03-17 NOTE — Telephone Encounter (Signed)
Resending

## 2019-03-17 NOTE — Telephone Encounter (Signed)
Copied from CRM [id number]. Topic: Quick Communication - Rx Refill/Question >> Mar 17, 2019  4:19 PM Elliot Gault wrote: Pharmacy never received chlordiazePOXIDE (LIBRIUM) 10 MG capsule, please re fax script  [facility] [address] [place] - [address] [place] [address] [phone] (Phone) [phone] (Fax)

## 2019-03-17 NOTE — Telephone Encounter (Signed)
Rx sent to pharmacy   

## 2019-03-17 NOTE — Telephone Encounter (Signed)
See note

## 2019-03-17 NOTE — Assessment & Plan Note (Addendum)
Symptoms still not controlled.  He seems to be doing well with Zyprexa 20 mg daily.  We will continue this.  He is interested in trying a TCA.  Given that he has tried and failed multiple SSRIs/SNRIs, think this is reasonable.  We will cross titrate his Effexor with nortriptyline 50 mg daily over the next 1 to 2 weeks.  Discussed potential interactions.  Discussed reasons to return to care.  He will follow-up with me in 1 to 2 weeks via mychart.  Next OV in 4-6 weeks.

## 2019-03-17 NOTE — Addendum Note (Signed)
Addended by: Ardith Dark on: 03/17/2019 05:02 PM   Modules accepted: Orders

## 2019-03-17 NOTE — Progress Notes (Signed)
    Chief Complaint:  Omar Krause is a 28 y.o. male who presents today for a virtual office visit with a chief complaint of GAS.   Assessment/Plan:   Generalized anxiety disorder Symptoms still not controlled.  He seems to be doing well with Zyprexa 20 mg daily.  We will continue this.  He is interested in trying a TCA.  Given that he has tried and failed multiple SSRIs/SNRIs, think this is reasonable.  We will cross titrate his Effexor with nortriptyline 50 mg daily over the next 1 to 2 weeks.  Discussed potential interactions.  Discussed reasons to return to care.  He will follow-up with me in 1 to 2 weeks via mychart.  Next OV in 4-6 weeks.   Depression, major, single episode, moderate (HCC) See above.  Worsening.  Will switch from Effexor to Pamelor.  He will check in with me in 1-2 weeks.   Asthma, mild intermittent Slightly worsened.  Will send in albuterol inhaler.      Subjective:  HPI:  # GAD Last seen about a month ago for this. We tried increasing his dose to 225mg  but he was unable to tolerate due to nausea.  He is currently on zyprexa 20mg  daily and effexor 150mg  daily. He is also on xanax 1mg  twice daily.  HE has unfortunately had worsening symptoms since our last visit.  However thinks that the Xanax is working well for him.  He had to stop his most recent job due to issues with anxiety and panic disorder.  He was also not hired at another job which seems to have worsened his symptoms.  He does not currently have any SI or HI however recently removed all firearms from his home due to his worsening symptoms.  Denies any specific plans to hurt himself or others.  He is interested in switching his medications today. No other obvious alleviating or aggravating factors.   # Asthma Chronic problem.  Worsened recently.  Possibly due to pollen.  Request refill of albuterol inhaler today.  ROS: Per HPI  PMH: He reports that he has never smoked. He has never used smokeless  tobacco. He reports current drug use. Drug: Marijuana. He reports that he does not drink alcohol.      Objective/Observations  Physical Exam: Gen: NAD, resting comfortably Pulm: Normal work of breathing Neuro: Grossly normal, moves all extremities Psych: Normal affect and thought content  Virtual Visit via Video   I connected with Che Cisse Krause on 03/17/19 at  2:00 PM EDT by a video enabled telemedicine application and verified that I am speaking with the correct person using two identifiers. I discussed the limitations of evaluation and management by telemedicine and the availability of in person appointments. The patient expressed understanding and agreed to proceed.   Patient location: Home Provider location: [place] [facility] Persons participating in the virtual visit: Myself and Patient     [name]. [name], MD 03/17/2019 2:46 PM

## 2019-03-26 ENCOUNTER — Encounter: Payer: Self-pay | Admitting: Family Medicine

## 2019-03-26 ENCOUNTER — Ambulatory Visit (INDEPENDENT_AMBULATORY_CARE_PROVIDER_SITE_OTHER): Payer: Self-pay | Admitting: Family Medicine

## 2019-03-26 VITALS — Ht 72.0 in | Wt 312.0 lb

## 2019-03-26 DIAGNOSIS — F411 Generalized anxiety disorder: Secondary | ICD-10-CM

## 2019-03-26 DIAGNOSIS — F321 Major depressive disorder, single episode, moderate: Secondary | ICD-10-CM

## 2019-03-26 MED ORDER — VENLAFAXINE HCL ER 150 MG PO CP24: 150.0000 mg | ORAL_CAPSULE | Freq: Every day | ORAL | Status: DC

## 2019-03-26 MED ORDER — VENLAFAXINE HCL ER 75 MG PO CP24: 75.0000 mg | ORAL_CAPSULE | Freq: Every day | ORAL | 0 refills | Status: DC

## 2019-03-26 NOTE — Progress Notes (Signed)
    Chief Complaint:  Omar Krause is a 28 y.o. male who presents today for a virtual office visit with a chief complaint of GAD.   Assessment/Plan:  Generalized anxiety disorder Worsening. Will go back to effexor as that seemed to work at least modestly.  Will cause tried.  With Pamelor over the next 1 to 2 weeks.  Would ideally like to have him back on his dose of Effexor 225 mg within 2 weeks.  Discussed potential side effects.  Also advised him to double up his dose of Librium to 20 mg 3 times daily as needed.  Thinks that his Klonopin worked best of any benzo in the past.  Once he finishes his current course of Librium, will likely switch back to [employer].  In the future potentially could increase his dose of gabapentin as he is currently on a very low dose.  He also asked about decreasing his dose of Zyprexa as he thinks it is making him too drowsy in the morning.  I think that this is a reasonable switch, however given the other changes noted above, I think we should wait for another couple of weeks before going forward with this.  He agrees with this plan.  Depression, major, single episode, moderate (HCC) Worsening.  See above.  Cross titrate Pamelor with Effexor.  Goal to have back on 225 mg of Effexor in the next couple of weeks.  Continue Zyprexa 20mg  daily.  May decrease dose in the near future as noted above.     Subjective:  HPI:  Depression / Anxiety Patient last seen for both of these problems 2 weeks ago.  At that time he was to switch to a TCA.  We cross titrated him from his dose of Effexor to nortriptyline 50 mg daily.  Overall, thinks his symptoms have significantly worsened.  He was also transition from Xanax to Librium.  Does not think this is with a good change either.  No reported SI or HI.  No reported side effects.  Has had some increased life stress recently.  Still looking for a job.  No other obvious alleviating or aggravating factors.  ROS: Per HPI  PMH:  He reports that he has never smoked. He has never used smokeless tobacco. He reports current drug use. Drug: Marijuana. He reports that he does not drink alcohol.      Objective/Observations  Physical Exam: Gen: NAD, resting comfortably Pulm: Normal work of breathing Neuro: Grossly normal, moves all extremities Psych: Normal affect and thought content  Virtual Visit via Video   I connected with Omar Krause on 03/26/19 at  3:40 PM EDT by a video enabled telemedicine application and verified that I am speaking with the correct person using two identifiers. I discussed the limitations of evaluation and management by telemedicine and the availability of in person appointments. The patient expressed understanding and agreed to proceed.   Patient location: Home Provider location: [place] [facility] Persons participating in the virtual visit: Myself and Patient     [name]. [name], MD 03/26/2019 4:09 PM

## 2019-03-26 NOTE — Assessment & Plan Note (Signed)
Worsening. Will go back to effexor as that seemed to work at least modestly.  Will cause tried.  With Pamelor over the next 1 to 2 weeks.  Would ideally like to have him back on his dose of Effexor 225 mg within 2 weeks.  Discussed potential side effects.  Also advised him to double up his dose of Librium to 20 mg 3 times daily as needed.  Thinks that his Klonopin worked best of any benzo in the past.  Once he finishes his current course of Librium, will likely switch back to [employer].  In the future potentially could increase his dose of gabapentin as he is currently on a very low dose.  He also asked about decreasing his dose of Zyprexa as he thinks it is making him too drowsy in the morning.  I think that this is a reasonable switch, however given the other changes noted above, I think we should wait for another couple of weeks before going forward with this.  He agrees with this plan.

## 2019-03-26 NOTE — Assessment & Plan Note (Signed)
Worsening.  See above.  Cross titrate Pamelor with Effexor.  Goal to have back on 225 mg of Effexor in the next couple of weeks.  Continue Zyprexa 20mg  daily.  May decrease dose in the near future as noted above.

## 2019-03-29 ENCOUNTER — Encounter: Payer: Self-pay | Admitting: Family Medicine

## 2019-03-29 DIAGNOSIS — F321 Major depressive disorder, single episode, moderate: Secondary | ICD-10-CM

## 2019-03-29 DIAGNOSIS — F411 Generalized anxiety disorder: Secondary | ICD-10-CM

## 2019-03-31 NOTE — Telephone Encounter (Signed)
Copied from [place] [id number]. Topic: General - Other >> Mar 31, 2019  1:16 PM Mcneil, Ja-Kwan wrote: Reason for CRM: Pt stated he sent a MYCHART message regarding work therapy programs but he has yet to hear back. Pt requests a call back. Cb# [phone]

## 2019-04-01 ENCOUNTER — Encounter: Payer: Self-pay | Admitting: Family Medicine

## 2019-04-01 ENCOUNTER — Telehealth (HOSPITAL_COMMUNITY): Payer: Self-pay | Admitting: Psychiatry

## 2019-04-02 NOTE — Telephone Encounter (Signed)
Advice only - Telephone call with patient to inform from the two times patient saw Dr. Modesta Messing before he chose to stop coming in to see her, there was no discussion of him wanting to file for disability or if she supported this and his ability to work?  Patient reported he tried several times to return to work but his PCP took him out and suggested he apply for disability.  Informed patient he could request [employer] request the two evaluation notes from Dr. Modesta Messing from 07/16/18 and 08/20/18 as she did diagnose him PTSD and Depression, major single episode.  Informed patient if he would like to return to see Dr. Modesta Messing for further evaluation he was welcome to make an appointment but we could not guarantee this would be supported by her then either, but would be based on her assessment.  Patient stated understanding and no appointment made at this time.  He will call back if decides this is what he would like to do or he will sign a consent for [employer] to obtain the notes already written from past evaluations.

## 2019-04-02 NOTE — Telephone Encounter (Signed)
Could you contact the patient and figure out how we can help him? Please note that I have not seen him since 07/2018, and I do not think we discussed about disability. It would be very difficult for me to make addendum to the note we already provided. Although I would be happy to see him again, I would not be able to guarantee whether or not I would support disability at this time (though we should be able to send office note if he wishes.)

## 2019-04-03 ENCOUNTER — Encounter: Payer: Self-pay | Admitting: Family Medicine

## 2019-04-03 MED ORDER — CLONAZEPAM 1 MG PO TABS: 1.0000 mg | ORAL_TABLET | Freq: Two times a day (BID) | ORAL | 0 refills | Status: DC

## 2019-04-06 NOTE — Addendum Note (Signed)
Addended by: Jasper Loser on: 04/06/2019 02:15 PM   Modules accepted: Orders

## 2019-04-07 ENCOUNTER — Encounter: Payer: Self-pay | Admitting: Family Medicine

## 2019-04-07 ENCOUNTER — Other Ambulatory Visit: Payer: Self-pay

## 2019-04-07 MED ORDER — ONDANSETRON HCL 8 MG PO TABS: 8.0000 mg | ORAL_TABLET | Freq: Three times a day (TID) | ORAL | 0 refills | Status: DC | PRN

## 2019-04-08 ENCOUNTER — Other Ambulatory Visit: Payer: Self-pay

## 2019-04-08 NOTE — Progress Notes (Signed)
Please advise 

## 2019-04-09 MED ORDER — GABAPENTIN 100 MG PO CAPS: 200.0000 mg | ORAL_CAPSULE | Freq: Three times a day (TID) | ORAL | 3 refills | Status: DC | PRN

## 2019-04-09 NOTE — Telephone Encounter (Signed)
Routed to Dr. Jerline Pain for Rx to be sent.

## 2019-04-11 ENCOUNTER — Other Ambulatory Visit: Payer: Self-pay

## 2019-04-11 ENCOUNTER — Emergency Department (HOSPITAL_COMMUNITY)
Admission: EM | Admit: 2019-04-11 | Discharge: 2019-04-12 | Disposition: A | Discharge status: Other Acute Inpatient Hospital | Payer: Self-pay | Attending: Emergency Medicine | Admitting: Emergency Medicine

## 2019-04-11 ENCOUNTER — Encounter (HOSPITAL_COMMUNITY): Payer: Self-pay | Admitting: Emergency Medicine

## 2019-04-11 DIAGNOSIS — F17210 Nicotine dependence, cigarettes, uncomplicated: Secondary | ICD-10-CM | POA: Insufficient documentation

## 2019-04-11 DIAGNOSIS — I10 Essential (primary) hypertension: Secondary | ICD-10-CM | POA: Insufficient documentation

## 2019-04-11 DIAGNOSIS — T450X2A Poisoning by antiallergic and antiemetic drugs, intentional self-harm, initial encounter: Secondary | ICD-10-CM | POA: Insufficient documentation

## 2019-04-11 DIAGNOSIS — T50904A Poisoning by unspecified drugs, medicaments and biological substances, undetermined, initial encounter: Secondary | ICD-10-CM

## 2019-04-11 DIAGNOSIS — F1290 Cannabis use, unspecified, uncomplicated: Secondary | ICD-10-CM

## 2019-04-11 DIAGNOSIS — Z20828 Contact with and (suspected) exposure to other viral communicable diseases: Secondary | ICD-10-CM | POA: Insufficient documentation

## 2019-04-11 DIAGNOSIS — F1220 Cannabis dependence, uncomplicated: Secondary | ICD-10-CM | POA: Insufficient documentation

## 2019-04-11 DIAGNOSIS — J45909 Unspecified asthma, uncomplicated: Secondary | ICD-10-CM | POA: Insufficient documentation

## 2019-04-11 DIAGNOSIS — F333 Major depressive disorder, recurrent, severe with psychotic symptoms: Secondary | ICD-10-CM

## 2019-04-11 DIAGNOSIS — F332 Major depressive disorder, recurrent severe without psychotic features: Secondary | ICD-10-CM | POA: Insufficient documentation

## 2019-04-11 DIAGNOSIS — T426X2A Poisoning by other antiepileptic and sedative-hypnotic drugs, intentional self-harm, initial encounter: Secondary | ICD-10-CM | POA: Insufficient documentation

## 2019-04-11 HISTORY — DX: Unspecified convulsions: R56.9

## 2019-04-11 LAB — COMPREHENSIVE METABOLIC PANEL
ALT: 26 U/L (ref 0–44)
AST: 19 U/L (ref 15–41)
Albumin: 4.3 g/dL (ref 3.5–5.0)
Alkaline Phosphatase: 80 U/L (ref 38–126)
Anion gap: 11 (ref 5–15)
BUN: 6 mg/dL (ref 6–20)
CO2: 22 mmol/L (ref 22–32)
Calcium: 8.9 mg/dL (ref 8.9–10.3)
Chloride: 105 mmol/L (ref 98–111)
Creatinine, Ser: 0.88 mg/dL (ref 0.61–1.24)
GFR calc Af Amer: >60 mL/min (ref >60)
GFR calc non Af Amer: >60 mL/min (ref >60)
Glucose, Bld: 79 mg/dL (ref 70–99)
Potassium: 3.8 mmol/L (ref 3.5–5.1)
Sodium: 138 mmol/L (ref 135–145)
Total Bilirubin: 0.6 mg/dL (ref 0.3–1.2)
Total Protein: 7.3 g/dL (ref 6.5–8.1)

## 2019-04-11 LAB — CBC WITH DIFFERENTIAL/PLATELET
Abs Immature Granulocytes: 0.02 10*3/uL (ref 0.00–0.07)
Basophils Absolute: 0.1 10*3/uL (ref 0.0–0.1)
Basophils Relative: 1 %
Eosinophils Absolute: 0.3 10*3/uL (ref 0.0–0.5)
Eosinophils Relative: 3 %
HCT: 45 % (ref 39.0–52.0)
Hemoglobin: 14.7 g/dL (ref 13.0–17.0)
Immature Granulocytes: 0 %
Lymphocytes Relative: 37 %
Lymphs Abs: 3.5 10*3/uL (ref 0.7–4.0)
MCH: 28.6 pg (ref 26.0–34.0)
MCHC: 32.7 g/dL (ref 30.0–36.0)
MCV: 87.5 fL (ref 80.0–100.0)
Monocytes Absolute: 0.6 10*3/uL (ref 0.1–1.0)
Monocytes Relative: 7 %
Neutro Abs: 4.9 10*3/uL (ref 1.7–7.7)
Neutrophils Relative %: 52 %
Platelets: 275 10*3/uL (ref 150–400)
RBC: 5.14 MIL/uL (ref 4.22–5.81)
RDW: 13.9 % (ref 11.5–15.5)
WBC: 9.4 10*3/uL (ref 4.0–10.5)
nRBC: 0 % (ref 0.0–0.2)

## 2019-04-11 LAB — RAPID URINE DRUG SCREEN, HOSP PERFORMED
Amphetamines: NOT DETECTED
Barbiturates: NOT DETECTED
Benzodiazepines: POSITIVE — AB
Cocaine: NOT DETECTED
Opiates: NOT DETECTED
Tetrahydrocannabinol: POSITIVE — AB

## 2019-04-11 LAB — SALICYLATE LEVEL: Salicylate Lvl: <7 mg/dL (ref 2.8–30.0)

## 2019-04-11 LAB — ETHANOL: Alcohol, Ethyl (B): <10 mg/dL (ref <10)

## 2019-04-11 LAB — ACETAMINOPHEN LEVEL: Acetaminophen (Tylenol), Serum: <10 ug/mL — ABNORMAL LOW (ref 10–30)

## 2019-04-11 LAB — CK: Total CK: 128 U/L (ref 49–397)

## 2019-04-11 MED ORDER — CLONAZEPAM 0.5 MG PO TABS
1.0000 mg | ORAL_TABLET | Freq: Two times a day (BID) | ORAL | Status: DC
  Administered 2019-04-11 – 2019-04-12 (×2): 1 mg via ORAL
  Filled 2019-04-11 (×2): qty 2

## 2019-04-11 MED ORDER — NICOTINE 21 MG/24HR TD PT24
21.0000 mg | MEDICATED_PATCH | Freq: Every day | TRANSDERMAL | Status: DC
  Administered 2019-04-11 – 2019-04-12 (×2): 21 mg via TRANSDERMAL
  Filled 2019-04-11 (×2): qty 1

## 2019-04-11 MED ORDER — ALBUTEROL SULFATE HFA 108 (90 BASE) MCG/ACT IN AERS: 2.0000 | INHALATION_SPRAY | Freq: Four times a day (QID) | RESPIRATORY_TRACT | Status: DC | PRN

## 2019-04-11 MED ORDER — ACETAMINOPHEN 325 MG PO TABS: 650.0000 mg | ORAL_TABLET | ORAL | Status: DC | PRN

## 2019-04-11 MED ORDER — OLANZAPINE 5 MG PO TABS
20.0000 mg | ORAL_TABLET | Freq: Every day | ORAL | Status: DC
  Administered 2019-04-11: 20 mg via ORAL
  Filled 2019-04-11: qty 4

## 2019-04-11 MED ORDER — ONDANSETRON HCL 4 MG PO TABS: 4.0000 mg | ORAL_TABLET | Freq: Three times a day (TID) | ORAL | Status: DC | PRN

## 2019-04-11 MED ORDER — ALUM & MAG HYDROXIDE-SIMETH 200-200-20 MG/5ML PO SUSP: 30.0000 mL | Freq: Four times a day (QID) | ORAL | Status: DC | PRN

## 2019-04-11 MED ORDER — VENLAFAXINE HCL ER 37.5 MG PO CP24
150.0000 mg | ORAL_CAPSULE | Freq: Every day | ORAL | Status: DC
  Administered 2019-04-12: 08:00:00 150 mg via ORAL
  Filled 2019-04-11: qty 4

## 2019-04-11 NOTE — ED Notes (Signed)
Patient reports taking Benadryl 500mg  along with smoking marijuana on Thursday night in order to "get a high."  Patient states shortly after he had a seizure in which he has a hx. Patient took a total of 9 grams on gabapentin and 1mg  of Clozapine last night over the course of 4 hours from 12am-4am in order to prevent seizure. Patient states "I know I went overboard." Patient denies any other symptoms other than dizziness and blurred vision. Pupils dilated but reactive. Patient denies this to be a suicide attempt. Poison Control to be contacted.
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
## 2019-04-11 NOTE — ED Notes (Signed)
Spoke with Cristie Hem from [employer], pt cleared from them.

## 2019-04-11 NOTE — ED Notes (Signed)
Patient gave his bag and cell phone for his mother to take home.

## 2019-04-11 NOTE — ED Triage Notes (Addendum)
Pt reports that he took a "whole bottle" of gabapentin last night to "prevent a seizure." Pt states he did it purposefully, but was not trying to harm himself. Pt denies recent SI, but has hx of. Also requesting help for his addiction to benzos and gabapentin. Pt is accompanied by mother.

## 2019-04-11 NOTE — ED Notes (Signed)
Poison Control control contacted due to ingestion of gabapentin. [name] pharmacist suggests CPK, tylenol, salicylate, EKG, CMP, and supportive care. Suggested 6hr observation from indigestion or until patient is un symptomatic. Common symptoms of GI upset, hypotension, resp depression. CNS depression, slurred speech, and bradycardia. Patient only reports dizziness and blurred vision. If EKG shows prolonged intervals, would be indicator that other medications are involved. If suspected Effexor XR involved 18 hr observation from indigestion. EDPA aware and is in room talking to patient. Poison Control to monitor patient's status.

## 2019-04-11 NOTE — ED Provider Notes (Signed)
[facility] Provider Note   CSN: [id number] Arrival date & time: 04/11/19  1351    History   Chief Complaint Chief Complaint  Patient presents with  . Drug Overdose    HPI Omar Krause is a 28 y.o. male with history of anxiety, asthma, depression, hypertension, obesity, seizures presents for evaluation after intentional drug overdose.  He reports that on Thursday evening 2 nights ago he took 500 mg of Benadryl and smoked some marijuana.  At around midnight that evening he was laying in bed when he began to feel "weird ", experienced some convulsing of his bilateral upper extremities, and states that the next thing he knew he was laying on the floor and was confused.  He experienced some bowel incontinence but no bladder incontinence or intraoral injury.  He believes that he had a seizure and states he has had 1 other seizure prior to this with similar presentation.  He states he did not seek any medical care at the time because he does not have health insurance.  Last night between the hours of midnight and 4 AM he reports that he took a total of 9 g of gabapentin in an attempt to prevent himself from having another seizure.  He denies any headache but noticed recently that his vision was blurred bilaterally.  He denies numbness or weakness in his extremities.  He states that initially after taking the gabapentin he was feeling somewhat lightheaded but this has resolved.  He denies nausea, vomiting, abdominal pain, chest pain, shortness of breath, fevers, or cough.  He states that he was not attempting to harm himself and denies any suicidal ideation or homicidal ideation today.  He does report seeing "visions of things dancing around" yesterday.  He states "I am a drug addict and I need help.  I think I need to go to a rehab facility".      The history is provided by the patient.    Past Medical History:  Diagnosis Date  . Anxiety   . Asthma    No inhaler at home,  has not had issues since he was child  . Depression   . Hypertension   . Morbid obesity ([facility])   . Nausea   . Palpitations   . Seizures [facility](HCC)     Patient Active Problem List   Diagnosis Date Noted  . PTSD (post-traumatic stress disorder) 07/16/2018  . Carpal tunnel syndrome of right wrist 05/02/2018  . Depression, major, single episode, moderate ([facility]) 03/31/2018  . Insomnia 03/12/2016  . Marijuana use 03/12/2016  . Asthma, mild intermittent 09/14/2015  . HTN (hypertension) 02/02/2014  . Generalized anxiety disorder 02/02/2014  . Morbid obesity ([facility]) 07/08/2013    Past Surgical History:  Procedure Laterality Date  . CIRCUMCISION    . TONSILLECTOMY AND ADENOIDECTOMY          Home Medications    Prior to Admission medications   Medication Sig Start Date End Date Taking? Authorizing Provider  albuterol (VENTOLIN HFA) 108 (90 Base) MCG/ACT inhaler Inhale 2 puffs into the lungs every 6 (six) hours as needed for wheezing or shortness of breath. 03/17/19  Yes Ardith DarkParker, Caleb M, MD  clonazePAM (KLONOPIN) 1 MG tablet Take 1 tablet (1 mg total) by mouth 2 (two) times daily. 04/03/19  Yes Ardith DarkParker, Caleb M, MD  diphenhydrAMINE (BENADRYL) 25 mg capsule Take 25 mg by mouth once.   Yes [provider]  gabapentin (NEURONTIN) 100 MG capsule Take 2 capsules (200  mg total) by mouth 3 (three) times daily as needed. 04/09/19  Yes Vivi Barrack, MD  Multiple Vitamin (MULTIVITAMIN WITH MINERALS) TABS tablet Take 1 tablet by mouth daily.   Yes [provider]  OLANZapine (ZYPREXA) 20 MG tablet Take 1 tablet (20 mg total) by mouth at bedtime. 02/17/19  Yes Vivi Barrack, MD  ondansetron (ZOFRAN) 8 MG tablet Take 1 tablet (8 mg total) by mouth every 8 (eight) hours as needed for nausea or vomiting. 04/07/19  Yes Vivi Barrack, MD  venlafaxine XR (EFFEXOR XR) 150 MG 24 hr capsule Take 1 capsule (150 mg total) by mouth daily with breakfast. 03/26/19  Yes Vivi Barrack, MD  venlafaxine  XR (EFFEXOR XR) 75 MG 24 hr capsule Take 1 capsule (75 mg total) by mouth daily with breakfast. 03/26/19  Yes Vivi Barrack, MD  chlordiazePOXIDE (LIBRIUM) 10 MG capsule Take 1 capsule (10 mg total) by mouth 3 (three) times daily as needed for anxiety. Patient not taking: Reported on 04/11/2019 03/17/19   Vivi Barrack, MD    Family History Family History  Problem Relation Age of Onset  . Arthritis Mother   . Diabetes Mother   . Alcohol abuse Mother   . Alcohol abuse Father   . Arthritis Father   . Hyperlipidemia Father   . Stroke Father   . Hypertension Father   . Mental illness Father   . Diabetes Father   . Bipolar disorder Father   . Heart attack Father   . Arthritis Maternal Grandmother   . Stroke Maternal Grandmother   . Diabetes Maternal Grandmother   . Arthritis Maternal Grandfather   . Heart attack Maternal Grandfather 50  . Diabetes Maternal Grandfather     Social History Social History   Tobacco Use  . Smoking status: Current Every Day Smoker    Packs/day: 0.50    Types: Cigarettes  . Smokeless tobacco: Never Used  Substance Use Topics  . Alcohol use: No    Frequency: Never  . Drug use: Yes    Types: Marijuana    Comment: last use 2 days ago     Allergies   Ceftin [cefuroxime axetil], Escitalopram, and Prednisone   Review of Systems Review of Systems  Constitutional: Negative for chills and fever.  Eyes: Negative for photophobia and visual disturbance.  Respiratory: Negative for cough and shortness of breath.   Cardiovascular: Negative for chest pain.  Gastrointestinal: Negative for abdominal pain, nausea and vomiting.  Neurological: Positive for seizures (?possible) and light-headedness (resolved). Negative for weakness, numbness and headaches.  All other systems reviewed and are negative.    Physical Exam Updated Vital Signs BP (!) 142/75   Pulse 80   Temp 98.2 F (36.8 C) (Oral)   Resp 20   Ht 6' (1.829 m)   Wt 135.2 kg   SpO2 98%    BMI 40.42 kg/m   Physical Exam Vitals signs and nursing note reviewed.  Constitutional:      General: He is not in acute distress.    Appearance: He is well-developed.  HENT:     Head: Normocephalic and atraumatic.  Eyes:     General:        Right eye: No discharge.        Left eye: No discharge.     Extraocular Movements: Extraocular movements intact.     Conjunctiva/sclera: Conjunctivae normal.     Pupils: Pupils are equal, round, and reactive to light.  Comments: Pupils dilated, equally reactive to light  Neck:     Musculoskeletal: Normal range of motion.     Vascular: No JVD.     Trachea: No tracheal deviation.  Cardiovascular:     Rate and Rhythm: Normal rate and regular rhythm.     Pulses: Normal pulses.  Pulmonary:     Effort: Pulmonary effort is normal.     Breath sounds: Normal breath sounds.  Abdominal:     General: Abdomen is flat. Bowel sounds are normal. There is no distension.     Palpations: Abdomen is soft.     Tenderness: There is no abdominal tenderness. There is no guarding or rebound.  Skin:    General: Skin is warm and dry.     Findings: No erythema.  Neurological:     General: No focal deficit present.     Mental Status: He is alert and oriented to person, place, and time.     Comments: Mental Status:  Alert, thought content appropriate, able to give a coherent history. Speech fluent without evidence of aphasia. Able to follow 2 step commands without difficulty.  Cranial Nerves:  II:  Peripheral visual fields grossly normal, pupils equal, round, reactive to light Krause,IV, VI: ptosis not present, extra-ocular motions intact bilaterally  V,VII: smile symmetric, facial light touch sensation equal VIII: hearing grossly normal to voice  X: uvula elevates symmetrically  XI: bilateral shoulder shrug symmetric and strong XII: midline tongue extension without fassiculations Motor:  Normal tone. 5/5 strength of BUE and BLE major muscle groups including  strong and equal grip strength and dorsiflexion/plantar flexion Sensory: light touch normal in all extremities. Cerebellar: Romberg sign absent Gait: normal gait and balance. Able to walk on toes and heels with ease.     Psychiatric:        Attention and Perception: Attention normal.        Mood and Affect: Affect is blunt.        Behavior: Behavior is withdrawn.        Thought Content: Thought content does not include homicidal ideation. Thought content does not include homicidal plan.        Judgment: Judgment is impulsive.      ED Treatments / Results  Labs (all labs ordered are listed, but only abnormal results are displayed) Labs Reviewed  ACETAMINOPHEN LEVEL - Abnormal; Notable for the following components:      Result Value   Acetaminophen (Tylenol), Serum <10 (*)    All other components within normal limits  RAPID URINE DRUG SCREEN, HOSP PERFORMED - Abnormal; Notable for the following components:   Benzodiazepines POSITIVE (*)    Tetrahydrocannabinol POSITIVE (*)    All other components within normal limits  COMPREHENSIVE METABOLIC PANEL  SALICYLATE LEVEL  ETHANOL  CBC WITH DIFFERENTIAL/PLATELET  CK    EKG EKG Interpretation  Date/Time:  Saturday April 11 2019 14:12:29 EDT Ventricular Rate:  86 PR Interval:    QRS Duration: 97 QT Interval:  341 QTC Calculation: 408 R Axis:   81 Text Interpretation:  Sinus rhythm Borderline T wave abnormalities Confirmed by Vanetta MuldersZackowski, Scott [id number]) on 04/11/2019 3:29:35 PM   Radiology No results found.  Procedures Procedures (including critical care time)  Medications Ordered in ED Medications  acetaminophen (TYLENOL) tablet 650 mg (has no administration in time range)  ondansetron (ZOFRAN) tablet 4 mg (has no administration in time range)  alum & mag hydroxide-simeth (MAALOX/MYLANTA) 200-200-20 MG/5ML suspension 30 mL (has no administration in time range)  albuterol (VENTOLIN HFA) 108 (90 Base) MCG/ACT inhaler 2 puff  (has no administration in time range)  clonazePAM (KLONOPIN) tablet 1 mg (1 mg Oral Given 04/11/19 2122)  OLANZapine (ZYPREXA) tablet 20 mg (20 mg Oral Given 04/11/19 2122)  venlafaxine XR (EFFEXOR-XR) 24 hr capsule 150 mg (has no administration in time range)  nicotine (NICODERM CQ - dosed in mg/24 hours) patch 21 mg (21 mg Transdermal Patch Applied 04/11/19 1755)     Initial Impression / Assessment and Plan / ED Course  I have reviewed the triage vital signs and the nursing notes.  Pertinent labs & imaging results that were available during my care of the patient were reviewed by me and considered in my medical decision making (see chart for details).        Patient presenting for evaluation after intentionally ingesting 9 g of gabapentin over the course of 4 hours last night.  He is afebrile, usually hypertensive with improvement while in the ED.  Vital signs otherwise stable.  He is nontoxic in appearance.  No focal neurologic deficits on examination.  He apparently has had a seizure once before but is not currently on seizure medications.  He attempted to take his gabapentin to avoid coming to the hospital.  Given no headache, doubt subarachnoid hemorrhage, intracranial hemorrhage, meningitis.  I encouraged him to follow-up with his PCP or neurologist on an outpatient basis for management of his seizures although they could be related to his substance abuse as he states that he did smoke marijuana and took 500 mg of Benadryl prior to his seizure on Thursday night.  Poison control was consulted and spoke to [employer] and provided recommendations for blood work and observation.  His lab work shows no leukocytosis, no anemia, no metabolic derangements.  CK is within normal limits.  No renal insufficiency.  His acetaminophen, salicylate, and ethanol levels are all negative.  His UDS is positive for benzos and THC which is consistent with the history provided to me.  He is medically cleared for  psychiatric evaluation at this time.  TTS recommends inpatient admission.  Of note, patient is here voluntarily and may require IVC if he attempts to leave prior to psychiatric assessment.  On reassessment patient resting comfortably no apparent distress, no new symptoms.  Final Clinical Impressions(s) / ED Diagnoses   Final diagnoses:  Drug overdose, undetermined intent, initial encounter  Severe episode of recurrent major depressive disorder, with psychotic features [facility](HCC)  Marijuana use    ED Discharge Orders    None       Bennye AlmFawze, Raffaella Edison A, PA-C 04/11/19 2132    Eber HongMiller, Brian, MD 04/12/19 [id number]    Eber HongMiller, Brian, MD 04/12/19 [phone]

## 2019-04-11 NOTE — ED Notes (Signed)
This RN gave pt's mother his bag with his cell phone in it

## 2019-04-12 ENCOUNTER — Encounter (HOSPITAL_COMMUNITY): Payer: Self-pay | Admitting: *Deleted

## 2019-04-12 ENCOUNTER — Inpatient Hospital Stay (HOSPITAL_COMMUNITY)
Admission: AD | Admit: 2019-04-12 | Discharge: 2019-04-16 | DRG: 885 | Disposition: A | Discharge status: Home / Self Care | Payer: Federal, State, Local not specified - Other | Source: Intra-hospital | Attending: Psychiatry | Admitting: Psychiatry

## 2019-04-12 ENCOUNTER — Other Ambulatory Visit: Payer: Self-pay

## 2019-04-12 DIAGNOSIS — Z915 Personal history of self-harm: Secondary | ICD-10-CM | POA: Diagnosis not present

## 2019-04-12 DIAGNOSIS — Z888 Allergy status to other drugs, medicaments and biological substances status: Secondary | ICD-10-CM

## 2019-04-12 DIAGNOSIS — R030 Elevated blood-pressure reading, without diagnosis of hypertension: Secondary | ICD-10-CM | POA: Diagnosis present

## 2019-04-12 DIAGNOSIS — F332 Major depressive disorder, recurrent severe without psychotic features: Principal | ICD-10-CM | POA: Diagnosis present

## 2019-04-12 DIAGNOSIS — Z79899 Other long term (current) drug therapy: Secondary | ICD-10-CM | POA: Diagnosis not present

## 2019-04-12 DIAGNOSIS — Z6841 Body Mass Index (BMI) 40.0 and over, adult: Secondary | ICD-10-CM | POA: Diagnosis not present

## 2019-04-12 DIAGNOSIS — G4700 Insomnia, unspecified: Secondary | ICD-10-CM | POA: Diagnosis present

## 2019-04-12 DIAGNOSIS — R569 Unspecified convulsions: Secondary | ICD-10-CM | POA: Diagnosis present

## 2019-04-12 DIAGNOSIS — J45909 Unspecified asthma, uncomplicated: Secondary | ICD-10-CM | POA: Diagnosis present

## 2019-04-12 DIAGNOSIS — R45851 Suicidal ideations: Secondary | ICD-10-CM | POA: Diagnosis not present

## 2019-04-12 DIAGNOSIS — Z818 Family history of other mental and behavioral disorders: Secondary | ICD-10-CM | POA: Diagnosis not present

## 2019-04-12 DIAGNOSIS — Z1159 Encounter for screening for other viral diseases: Secondary | ICD-10-CM

## 2019-04-12 DIAGNOSIS — F4310 Post-traumatic stress disorder, unspecified: Secondary | ICD-10-CM | POA: Diagnosis present

## 2019-04-12 DIAGNOSIS — F17210 Nicotine dependence, cigarettes, uncomplicated: Secondary | ICD-10-CM | POA: Diagnosis present

## 2019-04-12 DIAGNOSIS — I10 Essential (primary) hypertension: Secondary | ICD-10-CM | POA: Diagnosis present

## 2019-04-12 DIAGNOSIS — Z811 Family history of alcohol abuse and dependence: Secondary | ICD-10-CM

## 2019-04-12 DIAGNOSIS — T50992A Poisoning by other drugs, medicaments and biological substances, intentional self-harm, initial encounter: Secondary | ICD-10-CM | POA: Diagnosis not present

## 2019-04-12 DIAGNOSIS — T450X2A Poisoning by antiallergic and antiemetic drugs, intentional self-harm, initial encounter: Secondary | ICD-10-CM | POA: Diagnosis present

## 2019-04-12 DIAGNOSIS — R440 Auditory hallucinations: Secondary | ICD-10-CM | POA: Diagnosis present

## 2019-04-12 LAB — SARS CORONAVIRUS 2 BY RT PCR (HOSPITAL ORDER, PERFORMED IN ~~LOC~~ HOSPITAL LAB): SARS Coronavirus 2: NEGATIVE

## 2019-04-12 MED ORDER — ONDANSETRON HCL 4 MG PO TABS: 8.0000 mg | ORAL_TABLET | Freq: Three times a day (TID) | ORAL | Status: DC | PRN

## 2019-04-12 MED ORDER — MAGNESIUM HYDROXIDE 400 MG/5ML PO SUSP: 30.0000 mL | Freq: Every day | ORAL | Status: DC | PRN

## 2019-04-12 MED ORDER — VENLAFAXINE HCL ER 75 MG PO CP24
225.0000 mg | ORAL_CAPSULE | Freq: Every day | ORAL | Status: DC
  Administered 2019-04-13 – 2019-04-16 (×4): 225 mg via ORAL
  Filled 2019-04-12 (×7): qty 1

## 2019-04-12 MED ORDER — NICOTINE 21 MG/24HR TD PT24
21.0000 mg | MEDICATED_PATCH | Freq: Every day | TRANSDERMAL | Status: DC
  Administered 2019-04-13 – 2019-04-16 (×4): 21 mg via TRANSDERMAL
  Filled 2019-04-12 (×8): qty 1

## 2019-04-12 MED ORDER — ALUM & MAG HYDROXIDE-SIMETH 200-200-20 MG/5ML PO SUSP: 30.0000 mL | ORAL | Status: DC | PRN

## 2019-04-12 MED ORDER — CHLORDIAZEPOXIDE HCL 10 MG PO CAPS: 10.0000 mg | ORAL_CAPSULE | Freq: Three times a day (TID) | ORAL | Status: DC | PRN

## 2019-04-12 MED ORDER — TRAZODONE HCL 50 MG PO TABS
50.0000 mg | ORAL_TABLET | Freq: Every evening | ORAL | Status: DC | PRN
  Administered 2019-04-13: 50 mg via ORAL

## 2019-04-12 MED ORDER — OLANZAPINE 10 MG PO TABS
20.0000 mg | ORAL_TABLET | Freq: Every day | ORAL | Status: DC
  Administered 2019-04-12 – 2019-04-15 (×4): 20 mg via ORAL
  Filled 2019-04-12 (×7): qty 2
  Filled 2019-04-12: qty 14

## 2019-04-12 MED ORDER — ALBUTEROL SULFATE HFA 108 (90 BASE) MCG/ACT IN AERS: 1.0000 | INHALATION_SPRAY | RESPIRATORY_TRACT | Status: DC | PRN

## 2019-04-12 MED ORDER — VENLAFAXINE HCL ER 37.5 MG PO CP24
75.0000 mg | ORAL_CAPSULE | Freq: Every day | ORAL | Status: DC
  Administered 2019-04-12: 75 mg via ORAL
  Filled 2019-04-12: qty 2

## 2019-04-12 MED ORDER — HYDROXYZINE HCL 25 MG PO TABS
25.0000 mg | ORAL_TABLET | Freq: Three times a day (TID) | ORAL | Status: DC | PRN
  Filled 2019-04-12: qty 1

## 2019-04-12 MED ORDER — CLONAZEPAM 1 MG PO TABS
1.0000 mg | ORAL_TABLET | Freq: Two times a day (BID) | ORAL | Status: DC | PRN
  Administered 2019-04-12 – 2019-04-15 (×5): 1 mg via ORAL
  Filled 2019-04-12 (×5): qty 1

## 2019-04-12 MED ORDER — ADULT MULTIVITAMIN W/MINERALS CH
1.0000 | ORAL_TABLET | Freq: Every day | ORAL | Status: DC
  Administered 2019-04-13 – 2019-04-16 (×4): 1 via ORAL
  Filled 2019-04-12 (×7): qty 1

## 2019-04-12 MED ORDER — ACETAMINOPHEN 325 MG PO TABS: 650.0000 mg | ORAL_TABLET | Freq: Four times a day (QID) | ORAL | Status: DC | PRN

## 2019-04-12 NOTE — ED Notes (Signed)
Patient was provided with the phone to make his second call of the day.

## 2019-04-12 NOTE — ED Notes (Signed)
[place] called spoke with [facility] Joann and notified that patient was medically cleared. AC reports that she will notify day shift of information.

## 2019-04-12 NOTE — ED Notes (Signed)
Pt being IVC'd

## 2019-04-12 NOTE — ED Notes (Signed)
Family updated on transport of the patient to [facility].

## 2019-04-12 NOTE — ED Notes (Signed)
Meal provided 
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
## 2019-04-12 NOTE — Progress Notes (Signed)
Per [place], RN, [facility], pt has been accepted to [facility] bed 406-1. Accepting provider is Earleen Newport, NP. Attending provider is Dr. Mallie Darting, MD. Patient can arrive when transportation is available. Number for report is [phone]. CSW spoke with Jarrett Soho, Financial controller, regarding disposition.  Chalmers Guest. Guerry Bruin, MSW, [place] Work/Disposition Phone: [phone] Fax: [phone]

## 2019-04-12 NOTE — ED Notes (Signed)
Pt walked out of room and slammed the bathroom door. When this RN asked him what was wrong? He wouldn't answer.

## 2019-04-12 NOTE — ED Notes (Signed)
Communications called to transport patient to [facility] at this time.

## 2019-04-12 NOTE — Tx Team (Signed)
Initial Treatment Plan 04/12/2019 7:37 PM [name] YIF:[id number]    PATIENT STRESSORS: Financial difficulties Medication change or noncompliance Occupational concerns Substance abuse   PATIENT STRENGTHS: Ability for insight Average or above average intelligence Capable of independent living Communication skills General fund of knowledge Motivation for treatment/growth Physical Health Supportive family/friends Work skills   PATIENT IDENTIFIED PROBLEMS: "anxiety"  "depression"  "panic"  "substance abuse"  "suicidal thoughts"             DISCHARGE CRITERIA:  Ability to meet basic life and health needs Adequate post-discharge living arrangements Improved stabilization in mood, thinking, and/or behavior Medical problems require only outpatient monitoring Motivation to continue treatment in a less acute level of care Need for constant or close observation no longer present Reduction of life-threatening or endangering symptoms to within safe limits Safe-care adequate arrangements made Verbal commitment to aftercare and medication compliance Withdrawal symptoms are absent or subacute and managed without 24-hour nursing intervention  PRELIMINARY DISCHARGE PLAN: Attend aftercare/continuing care group Attend PHP/IOP Attend 12-step recovery group Outpatient therapy Return to previous living arrangement  PATIENT/FAMILY INVOLVEMENT: This treatment plan has been presented to and reviewed with the patient, Omar Krause.  The patient and family have been given the opportunity to ask questions and make suggestions.  Cammy Copa, [place] 04/12/2019, 7:37 PM

## 2019-04-12 NOTE — Plan of Care (Signed)
D: Patient is alert, pleasant, and cooperative. Denies SI, HI, AVH, and verbally contracts for safety. Interaction is minimal. Patient denies physical symptoms/pain.    A: Medications administered per MD order. Support provided. Patient educated on safety on the unit and medications. Routine safety checks every 15 minutes. Patient stated understanding to tell nurse about any new physical symptoms. Patient understands to tell staff of any needs.     R: No adverse drug reactions noted. Patient verbally contracts for safety. Patient remains safe at this time and will continue to monitor.   Problem: Education: Goal: Knowledge of [facility] Education information/materials will improve Outcome: Progressing   Patient oriented to the unit. Patient remains safe and will continue to monitor.   NOVEL CORONAVIRUS (COVID-19) DAILY CHECK-OFF SYMPTOMS - answer yes or no to each - every day NO YES  Have you had a fever in the past 24 hours?  Fever (Temp > 37.80C / 100F) X   Have you had any of these symptoms in the past 24 hours? New Cough  Sore Throat   Shortness of Breath  Difficulty Breathing  Unexplained Body Aches   X   Have you had any one of these symptoms in the past 24 hours not related to allergies?   Runny Nose  Nasal Congestion  Sneezing   X   If you have had runny nose, nasal congestion, sneezing in the past 24 hours, has it worsened?  X   EXPOSURES - check yes or no X   Have you traveled outside the state in the past 14 days?  X   Have you been in contact with someone with a confirmed diagnosis of COVID-19 or PUI in the past 14 days without wearing appropriate PPE?  X   Have you been living in the same home as a person with confirmed diagnosis of COVID-19 or a PUI (household contact)?    X   Have you been diagnosed with COVID-19?    X              What to do next: Answered NO to all: Answered YES to anything:   Proceed with unit schedule Follow the [facility]  Inpatient Flowsheet.

## 2019-04-12 NOTE — ED Notes (Signed)
[employer] serving IVC paper work to pt.

## 2019-04-13 DIAGNOSIS — R45851 Suicidal ideations: Secondary | ICD-10-CM

## 2019-04-13 DIAGNOSIS — T50992A Poisoning by other drugs, medicaments and biological substances, intentional self-harm, initial encounter: Secondary | ICD-10-CM

## 2019-04-13 DIAGNOSIS — F332 Major depressive disorder, recurrent severe without psychotic features: Secondary | ICD-10-CM

## 2019-04-13 LAB — TSH: TSH: 1.896 u[IU]/mL (ref 0.350–4.500)

## 2019-04-13 MED ORDER — OXCARBAZEPINE 150 MG PO TABS
75.0000 mg | ORAL_TABLET | Freq: Two times a day (BID) | ORAL | Status: DC
  Administered 2019-04-13 – 2019-04-16 (×7): 75 mg via ORAL
  Filled 2019-04-13 (×5): qty 0.5
  Filled 2019-04-13: qty 7
  Filled 2019-04-13 (×3): qty 0.5
  Filled 2019-04-13: qty 7
  Filled 2019-04-13: qty 1
  Filled 2019-04-13 (×3): qty 0.5

## 2019-04-13 NOTE — Tx Team (Signed)
Interdisciplinary Treatment and Diagnostic Plan Update  04/13/2019 Time of Session: 09:55am Adir Dadisman III MRN: [id number]  Principal Diagnosis: <principal problem not specified>  Secondary Diagnoses: Active Problems:   * No active hospital problems. *   Current Medications:  Current Facility-Administered Medications  Medication Dose Route Frequency Provider Last Rate Last Dose  . acetaminophen (TYLENOL) tablet 650 mg  650 mg Oral Q6H PRN Money, Travis B, FNP      . albuterol (VENTOLIN HFA) 108 (90 Base) MCG/ACT inhaler 1-2 puff  1-2 puff Inhalation Q4H PRN Money, Travis B, FNP      . alum & mag hydroxide-simeth (MAALOX/MYLANTA) 200-200-20 MG/5ML suspension 30 mL  30 mL Oral Q4H PRN Money, Travis B, FNP      . clonazePAM (KLONOPIN) tablet 1 mg  1 mg Oral BID PRN Money, Travis B, FNP   1 mg at 04/13/19 0756  . hydrOXYzine (ATARAX/VISTARIL) tablet 25 mg  25 mg Oral TID PRN Money, Travis B, FNP      . magnesium hydroxide (MILK OF MAGNESIA) suspension 30 mL  30 mL Oral Daily PRN Money, Travis B, FNP      . multivitamin with minerals tablet 1 tablet  1 tablet Oral Daily Money, Travis B, FNP   1 tablet at 04/13/19 0756  . nicotine (NICODERM CQ - dosed in mg/24 hours) patch 21 mg  21 mg Transdermal Daily Clary, Greg Lawson, MD   21 mg at 04/13/19 0757  . OLANZapine (ZYPREXA) tablet 20 mg  20 mg Oral QHS Money, Travis B, FNP   20 mg at 04/12/19 2154  . OXcarbazepine (TRILEPTAL) tablet 75 mg  75 mg Oral BID Clary, Greg Lawson, MD   75 mg at 04/13/19 1030  . traZODone (DESYREL) tablet 50 mg  50 mg Oral QHS PRN Money, Travis B, FNP      . venlafaxine XR (EFFEXOR-XR) 24 hr capsule 225 mg  225 mg Oral Q breakfast Money, Travis B, FNP   225 mg at 04/13/19 0756   PTA Medications: Medications Prior to Admission  Medication Sig Dispense Refill Last Dose  . albuterol (VENTOLIN HFA) 108 (90 Base) MCG/ACT inhaler Inhale 2 puffs into the lungs every 6 (six) hours as needed for wheezing or shortness of  breath. 1 Inhaler 2   . chlordiazePOXIDE (LIBRIUM) 10 MG capsule Take 1 capsule (10 mg total) by mouth 3 (three) times daily as needed for anxiety. (Patient not taking: Reported on 04/11/2019) 90 capsule 0   . clonazePAM (KLONOPIN) 1 MG tablet Take 1 tablet (1 mg total) by mouth 2 (two) times daily. 60 tablet 0   . diphenhydrAMINE (BENADRYL) 25 mg capsule Take 25 mg by mouth once.     . gabapentin (NEURONTIN) 100 MG capsule Take 2 capsules (200 mg total) by mouth 3 (three) times daily as needed. 180 capsule 3   . Multiple Vitamin (MULTIVITAMIN WITH MINERALS) TABS tablet Take 1 tablet by mouth daily.     . OLANZapine (ZYPREXA) 20 MG tablet Take 1 tablet (20 mg total) by mouth at bedtime. 30 tablet 3   . ondansetron (ZOFRAN) 8 MG tablet Take 1 tablet (8 mg total) by mouth every 8 (eight) hours as needed for nausea or vomiting. 30 tablet 0   . venlafaxine XR (EFFEXOR XR) 150 MG 24 hr capsule Take 1 capsule (150 mg total) by mouth daily with breakfast.     . venlafaxine XR (EFFEXOR XR) 75 MG 24 hr capsule Take 1 capsule (75 mg total) by   mouth daily with breakfast. 90 capsule 0     Patient Stressors: Financial difficulties Medication change or noncompliance Occupational concerns Substance abuse  Patient Strengths: Ability for insight Average or above average intelligence Capable of independent living Communication skills General fund of knowledge Motivation for treatment/growth Physical Health Supportive family/friends Work skills  Treatment Modalities: Medication Management, Group therapy, Case management,  1 to 1 session with clinician, Psychoeducation, Recreational therapy.   Physician Treatment Plan for Primary Diagnosis: <principal problem not specified> Long Term Goal(s): Improvement in symptoms so as ready for discharge Improvement in symptoms so as ready for discharge   Short Term Goals: Ability to identify changes in lifestyle to reduce recurrence of condition will  improve Ability to verbalize feelings will improve Ability to disclose and discuss suicidal ideas Ability to demonstrate self-control will improve Ability to identify and develop effective coping behaviors will improve Ability to maintain clinical measurements within normal limits will improve Ability to identify triggers associated with substance abuse/mental health issues will improve Ability to identify changes in lifestyle to reduce recurrence of condition will improve Ability to verbalize feelings will improve Ability to disclose and discuss suicidal ideas Ability to demonstrate self-control will improve Ability to identify and develop effective coping behaviors will improve Ability to maintain clinical measurements within normal limits will improve Ability to identify triggers associated with substance abuse/mental health issues will improve  Medication Management: Evaluate patient's response, side effects, and tolerance of medication regimen.  Therapeutic Interventions: 1 to 1 sessions, Unit Group sessions and Medication administration.  Evaluation of Outcomes: Not Met  Physician Treatment Plan for Secondary Diagnosis: Active Problems:   * No active hospital problems. *  Long Term Goal(s): Improvement in symptoms so as ready for discharge Improvement in symptoms so as ready for discharge   Short Term Goals: Ability to identify changes in lifestyle to reduce recurrence of condition will improve Ability to verbalize feelings will improve Ability to disclose and discuss suicidal ideas Ability to demonstrate self-control will improve Ability to identify and develop effective coping behaviors will improve Ability to maintain clinical measurements within normal limits will improve Ability to identify triggers associated with substance abuse/mental health issues will improve Ability to identify changes in lifestyle to reduce recurrence of condition will improve Ability to verbalize  feelings will improve Ability to disclose and discuss suicidal ideas Ability to demonstrate self-control will improve Ability to identify and develop effective coping behaviors will improve Ability to maintain clinical measurements within normal limits will improve Ability to identify triggers associated with substance abuse/mental health issues will improve     Medication Management: Evaluate patient's response, side effects, and tolerance of medication regimen.  Therapeutic Interventions: 1 to 1 sessions, Unit Group sessions and Medication administration.  Evaluation of Outcomes: Not Met   RN Treatment Plan for Primary Diagnosis: <principal problem not specified> Long Term Goal(s): Knowledge of disease and therapeutic regimen to maintain health will improve  Short Term Goals: Ability to participate in decision making will improve, Ability to verbalize feelings will improve, Ability to disclose and discuss suicidal ideas, Ability to identify and develop effective coping behaviors will improve and Compliance with prescribed medications will improve  Medication Management: RN will administer medications as ordered by provider, will assess and evaluate patient's response and provide education to patient for prescribed medication. RN will report any adverse and/or side effects to prescribing provider.  Therapeutic Interventions: 1 on 1 counseling sessions, Psychoeducation, Medication administration, Evaluate responses to treatment, Monitor vital signs and CBGs as  ordered, Perform/monitor CIWA, COWS, AIMS and Fall Risk screenings as ordered, Perform wound care treatments as ordered.  Evaluation of Outcomes: Not Met   LCSW Treatment Plan for Primary Diagnosis: <principal problem not specified> Long Term Goal(s): Safe transition to appropriate next level of care at discharge, Engage patient in therapeutic group addressing interpersonal concerns.  Short Term Goals: Engage patient in aftercare  planning with referrals and resources and Increase skills for wellness and recovery  Therapeutic Interventions: Assess for all discharge needs, 1 to 1 time with Social worker, Explore available resources and support systems, Assess for adequacy in community support network, Educate family and significant other(s) on suicide prevention, Complete Psychosocial Assessment, Interpersonal group therapy.  Evaluation of Outcomes: Not Met   Progress in Treatment: Attending groups: No. Participating in groups: No. Taking medication as prescribed: Yes. Toleration medication: Yes. Family/Significant other contact made: No, will contact:  will contact if given consent to contact Patient understands diagnosis: Yes. Discussing patient identified problems/goals with staff: Yes. Medical problems stabilized or resolved: Yes. Denies suicidal/homicidal ideation: Yes. Issues/concerns per patient self-inventory: No. Other:   New problem(s) identified: No, Describe:  None  New Short Term/Long Term Goal(s): Medication stabilization, elimination of SI thoughts, and development of a comprehensive mental wellness plan.   Patient Goals:  "to learn better pill management"  Discharge Plan or Barriers: CSW will continue to follow up for appropriate referrals and possible discharge planning  Reason for Continuation of Hospitalization: Anxiety Depression Medication stabilization  Estimated Length of Stay: 2-3 days  Attendees: Patient: 04/13/2019  Physician:Dr. Greg Clary, MD 04/13/2019  Nursing:Patrice, RN 04/13/2019  RN Care Manager: 04/13/2019  Social Worker: , LCSW 04/13/2019  Recreational Therapist: 04/13/2019  Other: 04/13/2019  Other: 04/13/2019  Other: 04/13/2019      Scribe for Treatment Team:  M , LCSW 04/13/2019 3:04 PM 

## 2019-04-13 NOTE — Progress Notes (Addendum)
Pt observe in dayroom, seen watching TV quietly. Pt appears to stay to himself. Pt denies SI/HI/AVH at this time. Pt requesting klonopin and trazodone HS for sleep. Support offered. Will continue with POC.

## 2019-04-13 NOTE — Progress Notes (Signed)
D: Patient is pleasant with staff.  He was asked if he needed anything, and he replied no.  He denies any physical pain.  He rates his depression and anxiety as a 5.  He denies any AVH/HI.  He is observed in the day room sitting quietly with his peers.  His goal today is to work on "medication management."  He denies any thoughts of self harm.  His affect is flat; restricted.  A: Continue to monitor medication management and MD orders.  Safety checks completed every 15 minutes per protocol.  Offer support and encouragement as needed.  R: Patient is receptive to staff; his behavior is appropriate.
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
## 2019-04-13 NOTE — Progress Notes (Signed)
[place] NOVEL CORONAVIRUS (COVID-19) DAILY CHECK-OFF SYMPTOMS - answer yes or no to each - every day NO YES  Have you had a fever in the past 24 hours?  . Fever (Temp > 37.80C / 100F) X   Have you had any of these symptoms in the past 24 hours? . New Cough .  Sore Throat  .  Shortness of Breath .  Difficulty Breathing .  Unexplained Body Aches   X   Have you had any one of these symptoms in the past 24 hours not related to allergies?   . Runny Nose .  Nasal Congestion .  Sneezing   X   If you have had runny nose, nasal congestion, sneezing in the past 24 hours, has it worsened?  X   EXPOSURES - check yes or no X   Have you traveled outside the state in the past 14 days?  X   Have you been in contact with someone with a confirmed diagnosis of COVID-19 or PUI in the past 14 days without wearing appropriate PPE?  X   Have you been living in the same home as a person with confirmed diagnosis of COVID-19 or a PUI (household contact)?    X   Have you been diagnosed with COVID-19?    X              What to do next: Answered NO to all: Answered YES to anything:   Proceed with unit schedule Follow the [facility] Inpatient Flowsheet.   Mask was given but not worn.  
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
## 2019-04-14 DIAGNOSIS — F332 Major depressive disorder, recurrent severe without psychotic features: Principal | ICD-10-CM

## 2019-04-14 NOTE — Progress Notes (Signed)
   04/14/19 2136  COVID-19 Daily Checkoff  Have you had a fever (temp > 37.80C/100F)  in the past 24 hours?  No  COVID-19 EXPOSURE  Have you traveled outside the state in the past 14 days? No  Have you been in contact with someone with a confirmed diagnosis of COVID-19 or PUI in the past 14 days without wearing appropriate PPE? No  Have you been living in the same home as a person with confirmed diagnosis of COVID-19 or a PUI (household contact)? No  Have you been diagnosed with COVID-19? No

## 2019-04-14 NOTE — Progress Notes (Signed)
[facility] MD Progress Note  04/14/2019 3:42 PM Omar Krause  MRN:  [id number] Subjective: Currently patient states he is feeling better than on admission.  Currently denies suicidal ideations.  Presents future oriented and states he is hopeful for discharge soon, stating that he wants to return home to continue taking care of his mother.  At this time does not endorse medication side effects.  Objective: Have discussed case with treatment team and have met with patient. 28 year old male.  Reports 2 recent overdoses (on gabapentin and on Benadryl).  Of note denies suicidal intent and states that he was "trying to get high", due to having no access to cannabis at the time.  Reportedly did have a seizure following overdose.  Patient reported some depression, anxiety, auditory hallucinations on admission.  As per chart notes patient presented with some irritability/aggression/escalation in ED.  Was admitted under IVC.  Today patient reports he is feeling better.  He currently denies suicidal ideations and as above states that recent overdoses were not associated with suicidal or self-injurious intent but rather as an attempt to become intoxicated.  He reports feeling regretful about this and states "I realize it was a dumb thing to do". At this time he denies any lingering side effects related to the overdose and presents fully alert, attentive. Currently also minimizes depression, states he is feeling better, denies any suicidal ideations, and is expressing hope to be discharged soon in order to return home and reunite with his family.  Patient's blood pressure has trended high.  Today 155/105.  No associated symptoms.  We reviewed.  Patient states that his blood pressure at home tends to be normal and he suspects that he has some "lab coat" related anxiety that artificially increases BP reading.  We discussed starting an antihypertensive medication but he states he prefers to wait until he is discharged in  order to discuss with his PCP. He is visible on unit, in dayroom, no disruptive or agitated behaviors, vaguely anxious on approach.   Labs reviewed EKG NSR, QTc 408 , TSH 1.89.  Principal Problem: Major depressive disorder, recurrent episode, severe (HCC) Diagnosis: Principal Problem:   Major depressive disorder, recurrent episode, severe ([place])  Total Time spent with patient: 20 minutes  Past Psychiatric History:   Past Medical History:  Past Medical History:  Diagnosis Date  . Anxiety   . Asthma    No inhaler at home, has not had issues since he was child  . Depression   . Hypertension   . Morbid obesity ([place])   . Nausea   . Palpitations   . Seizures ([place])     Past Surgical History:  Procedure Laterality Date  . CIRCUMCISION    . TONSILLECTOMY AND ADENOIDECTOMY     Family History:  Family History  Problem Relation Age of Onset  . Arthritis Mother   . Diabetes Mother   . Alcohol abuse Mother   . Alcohol abuse Father   . Arthritis Father   . Hyperlipidemia Father   . Stroke Father   . Hypertension Father   . Mental illness Father   . Diabetes Father   . Bipolar disorder Father   . Heart attack Father   . Arthritis Maternal Grandmother   . Stroke Maternal Grandmother   . Diabetes Maternal Grandmother   . Arthritis Maternal Grandfather   . Heart attack Maternal Grandfather 50  . Diabetes Maternal Grandfather    Family Psychiatric  History:  Social History:  Social History  Substance and Sexual Activity  Alcohol Use No  . Frequency: Never     Social History   Substance and Sexual Activity  Drug Use Yes  . Frequency: 4.0 times per week  . Types: Marijuana   Comment: last use 2 days ago    Social History   Socioeconomic History  . Marital status: Single    Spouse name: Not on file  . Number of children: 0  . Years of education: Not on file  . Highest education level: Bachelor's degree (e.g., BA, AB, BS)  Occupational History  . Occupation:  Ship broker   Social Needs  . Financial resource strain: Not on file  . Food insecurity    Worry: Not on file    Inability: Not on file  . Transportation needs    Medical: Not on file    Non-medical: Not on file  Tobacco Use  . Smoking status: Current Every Day Smoker    Packs/day: 0.50    Types: Cigarettes  . Smokeless tobacco: Never Used  Substance and Sexual Activity  . Alcohol use: No    Frequency: Never  . Drug use: Yes    Frequency: 4.0 times per week    Types: Marijuana    Comment: last use 2 days ago  . Sexual activity: Not Currently    Birth control/protection: None  Lifestyle  . Physical activity    Days per week: Not on file    Minutes per session: Not on file  . Stress: Not on file  Relationships  . Social Herbalist on phone: Not on file    Gets together: Not on file    Attends religious service: Not on file    Active member of club or organization: Not on file    Attends meetings of clubs or organizations: Not on file    Relationship status: Not on file  Other Topics Concern  . Not on file  Social History Narrative   Patient is right-handed. He states his mother lives with him. He lives in a one level home, 4 steps to enter. He drinks one energy drink a day. He walks his dog daily.   Additional Social History:    Pain Medications: see MAR Prescriptions: see MAR Over the Counter: see MAR History of alcohol / drug use?: Yes Longest period of sobriety (when/how long): 2 months Negative Consequences of Use: Financial, Work / School Withdrawal Symptoms: Other (Comment)(anxiety, depression) Name of Substance 1: THC 1 - Age of First Use: 28 yrs old 1 - Amount (size/oz): 1/4 oz weekly 1 - Frequency: couple times weekly 1 - Duration: since age 63 yrs old 82 - Last Use / Amount: 04/09/2019  Sleep: Improving  Appetite:  Improving  Current Medications: Current Facility-Administered Medications  Medication Dose Route Frequency Provider Last Rate  Last Dose  . acetaminophen (TYLENOL) tablet 650 mg  650 mg Oral Q6H PRN Money, Lowry Ram, FNP      . albuterol (VENTOLIN HFA) 108 (90 Base) MCG/ACT inhaler 1-2 puff  1-2 puff Inhalation Q4H PRN Money, Lowry Ram, FNP      . alum & mag hydroxide-simeth (MAALOX/MYLANTA) 200-200-20 MG/5ML suspension 30 mL  30 mL Oral Q4H PRN Money, Lowry Ram, FNP      . clonazePAM (KLONOPIN) tablet 1 mg  1 mg Oral BID PRN Money, Lowry Ram, FNP   1 mg at 04/13/19 2128  . hydrOXYzine (ATARAX/VISTARIL) tablet 25 mg  25 mg Oral TID PRN Money, Lowry Ram,  FNP      . magnesium hydroxide (MILK OF MAGNESIA) suspension 30 mL  30 mL Oral Daily PRN Money, Darnelle Maffucci B, FNP      . multivitamin with minerals tablet 1 tablet  1 tablet Oral Daily Money, Lowry Ram, FNP   1 tablet at 04/14/19 0817  . nicotine (NICODERM CQ - dosed in mg/24 hours) patch 21 mg  21 mg Transdermal Daily Sharma Covert, MD   21 mg at 04/14/19 0817  . OLANZapine (ZYPREXA) tablet 20 mg  20 mg Oral QHS Money, Lowry Ram, FNP   20 mg at 04/13/19 2128  . OXcarbazepine (TRILEPTAL) tablet 75 mg  75 mg Oral BID Sharma Covert, MD   75 mg at 04/14/19 0817  . traZODone (DESYREL) tablet 50 mg  50 mg Oral QHS PRN Money, Lowry Ram, FNP   50 mg at 04/13/19 2128  . venlafaxine XR (EFFEXOR-XR) 24 hr capsule 225 mg  225 mg Oral Q breakfast Money, Lowry Ram, FNP   225 mg at 04/14/19 2119    Lab Results:  Results for orders placed or performed during the hospital encounter of 04/12/19 (from the past 48 hour(s))  TSH     Status: None   Collection Time: 04/13/19  6:28 PM  Result Value Ref Range   TSH 1.896 0.350 - 4.500 uIU/mL    Comment: Performed by a 3rd Generation assay with a functional sensitivity of <=0.01 uIU/mL. Performed at [facility], [place] [address], [place], [place] [address]     Blood Alcohol level:  Lab Results  Component Value Date   [facility] <10 04/11/2019   ETH <11 81/44/8185    Metabolic Disorder Labs: Lab Results  Component Value Date    HGBA1C 5.3 09/06/2017   MPG 105 09/06/2017   No results found for: PROLACTIN Lab Results  Component Value Date   CHOL 169 09/06/2017   TRIG 229 (H) 09/06/2017   HDL 26 (L) 09/06/2017   CHOLHDL 6.5 (H) 09/06/2017   VLDL 23.4 01/06/2016   LDLCALC 108 (H) 09/06/2017   LDLCALC 137 (H) 01/06/2016    Physical Findings: AIMS: Facial and Oral Movements Muscles of Facial Expression: None, normal Lips and Perioral Area: None, normal Jaw: None, normal Tongue: None, normal,Extremity Movements Upper (arms, wrists, hands, fingers): None, normal Lower (legs, knees, ankles, toes): None, normal, Trunk Movements Neck, shoulders, hips: None, normal, Overall Severity Severity of abnormal movements (highest score from questions above): None, normal Incapacitation due to abnormal movements: None, normal Patient's awareness of abnormal movements (rate only patient's report): No Awareness, Dental Status Current problems with teeth and/or dentures?: No Does patient usually wear dentures?: No  CIWA:  CIWA-Ar Total: 1 COWS:  COWS Total Score: 2  Musculoskeletal: Strength & Muscle Tone: within normal limits Gait & Station: normal Patient leans: N/A  Psychiatric Specialty Exam: Physical Exam  ROS no chest pain, no shortness of breath, no cough, no fever, no chills  Blood pressure (!) 155/105, pulse 79, temperature 97.6 F (36.4 C), temperature source Oral, resp. rate 16, height 6' (1.829 m), weight 136.1 kg, SpO2 100 %.Body mass index is 40.69 kg/m.  General Appearance: Fairly Groomed  Eye Contact:  Fair-improves during session  Speech:  Normal Rate  Volume:  Normal  Mood:  Reports improved mood and today minimizes depression, states he feels better  Affect:  Vaguely anxious  Thought Process:  Linear and Descriptions of Associations: Intact  Orientation:  Other:  Fully alert and attentive  Thought  Content:  No hallucinations, no delusions  Suicidal Thoughts:  No currently denies suicidal or  self-injurious ideations, also denies homicidal or violent ideations  Homicidal Thoughts:  No  Memory:  Recent and remote grossly intact  Judgement:  Fair/improving  Insight:  Fair  Psychomotor Activity:  Normal  Concentration:  Concentration: Good and Attention Span: Good  Recall:  Good  Fund of Knowledge:  Good  Language:  Good  Akathisia:  Negative  Handed:  Right  AIMS (if indicated):     Assets:  Communication Skills Desire for Improvement Resilience  ADL's:  Intact  Cognition:  WNL  Sleep:  Number of Hours: 6.75   Assessment:  28 year old male.  Reports 2 recent overdoses (on gabapentin and on Benadryl).  Of note denies suicidal intent and states that he was "trying to get high", due to having no access to cannabis at the time.  Reportedly did have a seizure following overdose.  Patient reported some depression, anxiety, auditory hallucinations on admission.  As per chart notes patient presented with some irritability/aggression/escalation in ED.  Was admitted under IVC.  At present patient denies suicidal ideations and insists that recent overdoses were not suicidal in intent.Marland Kitchen He is not endorsing significant depression or neurovegetative symptoms at this time.  He is presenting future oriented and hopeful for discharge soon.  Currently denies medication side effects.  Treatment Plan Summary: Daily contact with patient to assess and evaluate symptoms and progress in treatment, Medication management, Plan inpatient treatment and Medications as below Treatment plan reviewed as below today 6/23 Encourage group and milieu participation to work on coping skills and symptom reduction Continue Effexor XR to 25 mg daily for depression and anxiety Continue Zyprexa 20 mg nightly for mood disorder Continue Trileptal 75 mg twice daily for mood disorder Continue Klonopin 1 mg twice daily PRN for anxiety Discontinue trazodone.  Check lipid panel, hemoglobin A1c Treatment team working on  disposition planning options Jenne Campus, MD 04/14/2019, 3:42 PM

## 2019-04-14 NOTE — BHH Group Notes (Signed)
Adult Psychoeducational Group Note  Date:  04/14/2019 Time:  10:24 AM  Group Topic/Focus:  Goals Group:   The focus of this group is to help patients establish daily goals to achieve during treatment and discuss how the patient can incorporate goal setting into their daily lives to aide in recovery.  Participation Level:  Active  Participation Quality:  Appropriate  Affect:  Appropriate  Cognitive:  Appropriate  Insight: Appropriate  Engagement in Group:  Engaged  Modes of Intervention:  Orientation  Additional Comments:  Pt attended and participated in orientation/goals group. Pt goal for today is to focus on discharge plan and taking care of mom when he get out of the hospital.   Omar Krause 04/14/2019, 10:24 AM

## 2019-04-14 NOTE — Progress Notes (Signed)
Spiritual care group on grief and loss facilitated by chaplain Genever Hentges  Group Goal:  Support / Education around grief and loss Members engage in facilitated group support and psycho social education.  Group Description:  Following introductions and group rules,  Group members engaged in facilitated group dialog and support around topic of loss, with particular support around experiences of loss in their lives. Group Identified types of loss (relationships / self / things) and identified patterns, circumstances, and changes that precipitate losses. Reflected on thoughts / feelings around loss, normalized grief responses, and recognized variety in grief experience. Patient Progress:   
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
## 2019-04-14 NOTE — Progress Notes (Signed)
Pt rates depression 1/10, anxiety 2/10, and hopelessness 0. Pt denies SI/HI. Pt reported good sleep last night. Pt compliant with taking meds and denies any side effects. Pt expressed he's feeling better today and would like to go home.  Medications reviewed with pt. Verbal support provided. Pt encouraged to attend groups. 15 minute checks performed for safety.  Pt compliant with tx plan.

## 2019-04-15 LAB — HEMOGLOBIN A1C
Hgb A1c MFr Bld: 5.3 % (ref 4.8–5.6)
Mean Plasma Glucose: 105.41 mg/dL

## 2019-04-15 LAB — LIPID PANEL
Cholesterol: 219 mg/dL — ABNORMAL HIGH (ref 0–200)
HDL: 25 mg/dL — ABNORMAL LOW (ref >40)
LDL Cholesterol: 154 mg/dL — ABNORMAL HIGH (ref 0–99)
Total CHOL/HDL Ratio: 8.8 RATIO
Triglycerides: 199 mg/dL — ABNORMAL HIGH (ref <150)
VLDL: 40 mg/dL (ref 0–40)

## 2019-04-15 MED ORDER — CLONIDINE HCL 0.1 MG PO TABS
0.1000 mg | ORAL_TABLET | Freq: Once | ORAL | Status: AC
  Administered 2019-04-15: 0.1 mg via ORAL
  Filled 2019-04-15 (×2): qty 1

## 2019-04-15 MED ORDER — LOSARTAN POTASSIUM 50 MG PO TABS
50.0000 mg | ORAL_TABLET | Freq: Every day | ORAL | Status: DC
  Administered 2019-04-15 – 2019-04-16 (×2): 50 mg via ORAL
  Filled 2019-04-15 (×4): qty 1
  Filled 2019-04-15: qty 7

## 2019-04-15 MED ORDER — CLONAZEPAM 0.5 MG PO TABS
0.5000 mg | ORAL_TABLET | Freq: Two times a day (BID) | ORAL | Status: DC | PRN
  Administered 2019-04-15: 0.5 mg via ORAL
  Filled 2019-04-15: qty 1

## 2019-04-15 NOTE — Progress Notes (Addendum)
[facility] MD Progress Note  04/15/2019 11:39 AM Omar Krause  MRN:  [id number] Subjective: Reports he is feeling better. Denies suicidal ideations. States, regarding recent overdose, " I have learned from my mistake, I realize I could have died ". States he is motivated in " being there for my mother", whom he lives with. Currently denies medication side effects.   Objective: Have discussed case with treatment team and have met with patient. 28 year old singlemale.  Reports 2 recent overdoses (on Gabapentin and on Benadryl).  Of note denies suicidal intent and states that he was "trying to get high", due to having no access to cannabis at the time.  Reportedly did have a seizure following overdose.  Patient reported some depression, anxiety, auditory hallucinations on admission.  As per chart notes patient presented with some irritability/aggression/escalation in ED.  Was admitted under IVC.  Patient reports he is feeling better and currently denies significant depression, states " I am feeling better today than I have felt in a long time". Describes decreased anxiety as well as improving mood. Denies suicidal ideations ( he reports recent overdoses were not suicidal in nature).  BP remains elevated- we reviewed , today agrees to start antidepressant. We discussed options- he remembers having been prescribed Losartan for a period of time in the past without side effects. Patient visible in milieu, no disruptive or agitated behaviors.  Labs reviewed- HgbA1C 5.3, Lipid Panel- cholesterol 219,Triglycerides 199. Denies medication side effects.     Principal Problem: Major depressive disorder, recurrent episode, severe (HCC) Diagnosis: Principal Problem:   Major depressive disorder, recurrent episode, severe ([place])  Total Time spent with patient: 20 minutes  Past Psychiatric History:   Past Medical History:  Past Medical History:  Diagnosis Date  . Anxiety   . Asthma    No inhaler at home, has  not had issues since he was child  . Depression   . Hypertension   . Morbid obesity ([place])   . Nausea   . Palpitations   . Seizures ([place])     Past Surgical History:  Procedure Laterality Date  . CIRCUMCISION    . TONSILLECTOMY AND ADENOIDECTOMY     Family History:  Family History  Problem Relation Age of Onset  . Arthritis Mother   . Diabetes Mother   . Alcohol abuse Mother   . Alcohol abuse Father   . Arthritis Father   . Hyperlipidemia Father   . Stroke Father   . Hypertension Father   . Mental illness Father   . Diabetes Father   . Bipolar disorder Father   . Heart attack Father   . Arthritis Maternal Grandmother   . Stroke Maternal Grandmother   . Diabetes Maternal Grandmother   . Arthritis Maternal Grandfather   . Heart attack Maternal Grandfather 50  . Diabetes Maternal Grandfather    Family Psychiatric  History:  Social History:  Social History   Substance and Sexual Activity  Alcohol Use No  . Frequency: Never     Social History   Substance and Sexual Activity  Drug Use Yes  . Frequency: 4.0 times per week  . Types: Marijuana   Comment: last use 2 days ago    Social History   Socioeconomic History  . Marital status: Single    Spouse name: Not on file  . Number of children: 0  . Years of education: Not on file  . Highest education level: Bachelor's degree (e.g., BA, AB, BS)  Occupational History  .  Occupation: Ship broker   Social Needs  . Financial resource strain: Not on file  . Food insecurity    Worry: Not on file    Inability: Not on file  . Transportation needs    Medical: Not on file    Non-medical: Not on file  Tobacco Use  . Smoking status: Current Every Day Smoker    Packs/day: 0.50    Types: Cigarettes  . Smokeless tobacco: Never Used  Substance and Sexual Activity  . Alcohol use: No    Frequency: Never  . Drug use: Yes    Frequency: 4.0 times per week    Types: Marijuana    Comment: last use 2 days ago  . Sexual activity:  Not Currently    Birth control/protection: None  Lifestyle  . Physical activity    Days per week: Not on file    Minutes per session: Not on file  . Stress: Not on file  Relationships  . Social Herbalist on phone: Not on file    Gets together: Not on file    Attends religious service: Not on file    Active member of club or organization: Not on file    Attends meetings of clubs or organizations: Not on file    Relationship status: Not on file  Other Topics Concern  . Not on file  Social History Narrative   Patient is right-handed. He states his mother lives with him. He lives in a one level home, 4 steps to enter. He drinks one energy drink a day. He walks his dog daily.   Additional Social History:    Pain Medications: see MAR Prescriptions: see MAR Over the Counter: see MAR History of alcohol / drug use?: Yes Longest period of sobriety (when/how long): 2 months Negative Consequences of Use: Financial, Work / School Withdrawal Symptoms: Other (Comment)(anxiety, depression) Name of Substance 1: THC 1 - Age of First Use: 28 yrs old 1 - Amount (size/oz): 1/4 oz weekly 1 - Frequency: couple times weekly 1 - Duration: since age 61 yrs old 86 - Last Use / Amount: 04/09/2019  Sleep: Improving  Appetite:  Improving  Current Medications: Current Facility-Administered Medications  Medication Dose Route Frequency Provider Last Rate Last Dose  . acetaminophen (TYLENOL) tablet 650 mg  650 mg Oral Q6H PRN Money, Lowry Ram, FNP      . albuterol (VENTOLIN HFA) 108 (90 Base) MCG/ACT inhaler 1-2 puff  1-2 puff Inhalation Q4H PRN Money, Lowry Ram, FNP      . alum & mag hydroxide-simeth (MAALOX/MYLANTA) 200-200-20 MG/5ML suspension 30 mL  30 mL Oral Q4H PRN Money, Lowry Ram, FNP      . clonazePAM (KLONOPIN) tablet 1 mg  1 mg Oral BID PRN Money, Lowry Ram, FNP   1 mg at 04/15/19 0815  . hydrOXYzine (ATARAX/VISTARIL) tablet 25 mg  25 mg Oral TID PRN Money, Lowry Ram, FNP      .  magnesium hydroxide (MILK OF MAGNESIA) suspension 30 mL  30 mL Oral Daily PRN Money, Lowry Ram, FNP      . multivitamin with minerals tablet 1 tablet  1 tablet Oral Daily Money, Lowry Ram, FNP   1 tablet at 04/15/19 0813  . nicotine (NICODERM CQ - dosed in mg/24 hours) patch 21 mg  21 mg Transdermal Daily Sharma Covert, MD   21 mg at 04/15/19 7681  . OLANZapine (ZYPREXA) tablet 20 mg  20 mg Oral QHS Money, Lowry Ram, FNP  20 mg at 04/14/19 2136  . OXcarbazepine (TRILEPTAL) tablet 75 mg  75 mg Oral BID Sharma Covert, MD   75 mg at 04/15/19 0813  . venlafaxine XR (EFFEXOR-XR) 24 hr capsule 225 mg  225 mg Oral Q breakfast Money, Lowry Ram, FNP   225 mg at 04/15/19 4035    Lab Results:  Results for orders placed or performed during the hospital encounter of 04/12/19 (from the past 48 hour(s))  TSH     Status: None   Collection Time: 04/13/19  6:28 PM  Result Value Ref Range   TSH 1.896 0.350 - 4.500 uIU/mL    Comment: Performed by a 3rd Generation assay with a functional sensitivity of <=0.01 uIU/mL. Performed at [facility], [place] [address] [place], [place] [address]   Lipid panel     Status: Abnormal   Collection Time: 04/15/19  6:57 AM  Result Value Ref Range   Cholesterol 219 (H) 0 - 200 mg/dL   Triglycerides 199 (H) <150 mg/dL   HDL 25 (L) >40 mg/dL   Total CHOL/HDL Ratio 8.8 RATIO   VLDL 40 0 - 40 mg/dL   LDL Cholesterol 154 (H) 0 - 99 mg/dL    Comment:        Total Cholesterol/HDL:CHD Risk Coronary Heart Disease Risk Table                     Men   Women  1/2 Average Risk   3.4   3.3  Average Risk       5.0   4.4  2 X Average Risk   9.6   7.1  3 X Average Risk  23.4   11.0        Use the calculated Patient Ratio above and the CHD Risk Table to determine the patient's CHD Risk.        ATP Krause CLASSIFICATION (LDL):  <100     mg/dL   Optimal  100-129  mg/dL   Near or Above                    Optimal  130-159  mg/dL   Borderline  160-189  mg/dL    High  >190     mg/dL   Very High Performed at [facility] [address] [place],  [address]   Hemoglobin A1c     Status: None   Collection Time: 04/15/19  6:57 AM  Result Value Ref Range   Hgb A1c MFr Bld 5.3 4.8 - 5.6 %    Comment: (NOTE) Pre diabetes:          5.7%-6.4% Diabetes:              >6.4% Glycemic control for   <7.0% adults with diabetes    Mean Plasma Glucose 105.41 mg/dL    Comment: Performed at [facility] [address] [place],  [address]    Blood Alcohol level:  Lab Results  Component Value Date   [facility] <10 04/11/2019   ETH <11 24/46/9507    Metabolic Disorder Labs: Lab Results  Component Value Date   HGBA1C 5.3 04/15/2019   MPG 105.41 04/15/2019   MPG 105 09/06/2017   No results found for: PROLACTIN Lab Results  Component Value Date   CHOL 219 (H) 04/15/2019   TRIG 199 (H) 04/15/2019   HDL 25 (L) 04/15/2019   CHOLHDL 8.8 04/15/2019   VLDL 40 04/15/2019  LDLCALC 154 (H) 04/15/2019   LDLCALC 108 (H) 09/06/2017    Physical Findings: AIMS: Facial and Oral Movements Muscles of Facial Expression: None, normal Lips and Perioral Area: None, normal Jaw: None, normal Tongue: None, normal,Extremity Movements Upper (arms, wrists, hands, fingers): None, normal Lower (legs, knees, ankles, toes): None, normal, Trunk Movements Neck, shoulders, hips: None, normal, Overall Severity Severity of abnormal movements (highest score from questions above): None, normal Incapacitation due to abnormal movements: None, normal Patient's awareness of abnormal movements (rate only patient's report): No Awareness, Dental Status Current problems with teeth and/or dentures?: No Does patient usually wear dentures?: No  CIWA:  CIWA-Ar Total: 1 COWS:  COWS Total Score: 2  Musculoskeletal: Strength & Muscle Tone: within normal limits Gait & Station: normal Patient leans: N/A  Psychiatric Specialty Exam: Physical Exam  ROS  no chest pain, no shortness of breath, no cough, no fever, no chills  Blood pressure (!) 164/101, pulse 85, temperature 97.6 F (36.4 C), temperature source Oral, resp. rate 20, height 6' (1.829 m), weight 136.1 kg, SpO2 100 %.Body mass index is 40.69 kg/m.  General Appearance: Well Groomed  Eye Contact:  improving   Speech:  Normal Rate  Volume:  Normal  Mood:  reports feeling better, denies feeling depressed at this time  Affect:  Appropriate and less anxious   Thought Process:  Linear and Descriptions of Associations: Intact  Orientation:  Other:  Fully alert and attentive  Thought Content:  No hallucinations, no delusions  Suicidal Thoughts:  No currently denies suicidal or self-injurious ideations, also denies homicidal or violent ideations  Homicidal Thoughts:  No  Memory:  Recent and remote grossly intact  Judgement:  improving  Insight:  Fair/ improving  Psychomotor Activity:  Normal  Concentration:  Concentration: Good and Attention Span: Good  Recall:  Good  Fund of Knowledge:  Good  Language:  Good  Akathisia:  Negative  Handed:  Right  AIMS (if indicated):     Assets:  Communication Skills Desire for Improvement Resilience  ADL's:  Intact  Cognition:  WNL  Sleep:  Number of Hours: 6   Assessment:  28 year old male.  Reports 2 recent overdoses (on gabapentin and on Benadryl).  Of note denies suicidal intent and states that he was "trying to get high", due to having no access to cannabis at the time.  Reportedly did have a seizure following overdose.  Patient reported some depression, anxiety, auditory hallucinations on admission.  As per chart notes patient presented with some irritability/aggression/escalation in ED.  Was admitted under IVC.  Today he reports feeling significantly better, affect presents improved/ less anxious, denies suicidal ideations, presents future oriented, looking forward to returning home. Currently tolerating medications well . BP remains  elevated, and states he has been found to have high blood pressure in the past . Today interested in starting antihypertensive medication and states tolerated Losartan trial well in the past .   *Of note, patient has been on Effexor XR for several months, and dose was increased two to three weeks ago. We have reviewed that Effexor can potentially contributed to elevated BP. Patient reports he prefers to continue Effexor XR at this time as it has been effective , is feeling better. He also reports BP had been elevated prior to initiating Effexor XR trial.     Treatment Plan Summary: Daily contact with patient to assess and evaluate symptoms and progress in treatment, Medication management, Plan inpatient treatment and Medications as below Treatment plan reviewed as  below today 6/24 Encourage group and milieu participation to work on coping skills and symptom reduction Continue Effexor XR  225 mg daily for depression and anxiety Continue Zyprexa 20 mg nightly for mood disorder Continue Trileptal 75 mg twice daily for mood disorder Decrease Klonopin to 0.5  mg twice daily PRN for anxiety Start Losartan 50 mgrs QDAY for HTN Treatment team working on disposition planning options Jenne Campus, MD 04/15/2019, 11:39 AM   Patient ID: Omar Krause, male   DOB: 21-Mar-1991, 28 y.o.   MRN: [id number]

## 2019-04-15 NOTE — Progress Notes (Signed)
D:  Omar Krause was in the day room much of the evening.  He was noted playing cards with peers.  He reported that he had a much better day today and is hoping to be discharged Thursday.  He denied SI/HI or A/V hallucinations.  He reported some anxiety and Klonopin was given with his hs medications.  He is currently resting with his eyes closed and appears to be asleep. A:  1:1 with RN for support and encouragement.  Medications as ordered.  Q 15 minute checks maintained for safety.  Encouraged participation in group and unit activities.   R:  [name] remains safe on the unit.  We will continue to monitor the progress towards his goals.

## 2019-04-15 NOTE — Plan of Care (Signed)
  Problem: Education: Goal: Knowledge of [facility] Education information/materials will improve Outcome: Progressing   Problem: Education: Goal: Knowledge of the prescribed therapeutic regimen will improve Outcome: Progressing   Problem: Coping: Goal: Coping ability will improve Outcome: Progressing

## 2019-04-15 NOTE — Progress Notes (Signed)
Pt blunted in affect and depressed in mood. Pt reports some anxiety. Pt's blood pressure continues to run high this evening at 170/80. Provider on call notified, one time dose of Clonidine 0.1mg  provided. Pt denies SI/HI/AVH and contracts for safety.

## 2019-04-15 NOTE — Progress Notes (Signed)
Patient ID: Omar Krause, male   DOB: March 29, 1991, 28 y.o.   MRN: [id number]  Nursing Progress Note [id number]  On initial approach, patient is seen up in the milieu. Patient presents blunted but does brighten during interactions. Patient compliant with scheduled medications. Patient is seen attending groups and visible in the milieu. Patient currently denies SI/HI/AVH. Patient BP remains elevated; provider notified. Patient complains of anxiety this morning and is provided PRN Klonopin with relief.  Patient is educated about and provided medication per provider's orders. Patient safety maintained with q15 min safety checks and low fall risk precautions. Emotional support given, 1:1 interaction, and active listening provided. Patient encouraged to attend meals, groups, and work on treatment plan and goals. Labs, vital signs and patient behavior monitored throughout shift. Patient encouraged to wear mask when in the milieu and is educated about coronavirus infection control precautions.  Patient refusing to wear mask on the unit but is compliant to wear mask off unit. Patient contracts for safety with staff. Patient remains safe on the unit at this time and agrees to come to staff with any issues/concerns. Patient is interacting with peers appropriately on the unit. Will continue to support and monitor.   Patient's self-inventory sheet Rated Energy Level  Normal  Rated Sleep  Good  Rated Appetite  Good  Rated Anxiety (0-10)  1  Rated Hopelessness (0-10)  0  Rated Depression (0-10)  2  Daily Goal  "go home"  Any Additional Comments:  N/A

## 2019-04-15 NOTE — [facility] Group Notes (Signed)
Adult Psychoeducational Group Note  Date:  04/15/2019 Time:  9:17 AM  Group Topic/Focus:  Goals Group:   The focus of this group is to help patients establish daily goals to achieve during treatment and discuss how the patient can incorporate goal setting into their daily lives to aide in recovery.  Participation Level:  Active  Participation Quality:  Appropriate  Affect:  Appropriate  Cognitive:  Appropriate  Insight: Appropriate  Engagement in Group:  Engaged  Modes of Intervention:  Problem-solving  Additional Comments:  Pt attended and participated in orientation/goals group. Pt goal is to work on discharge plan.   Huel Cote 04/15/2019, 9:17 AM

## 2019-04-15 NOTE — Plan of Care (Signed)
  Problem: Education: Goal: Knowledge of the prescribed therapeutic regimen will improve Outcome: Progressing   Problem: Coping: Goal: Coping ability will improve Outcome: Progressing   

## 2019-04-15 NOTE — [facility] Group Notes (Signed)
Occupational Therapy Group Note  Date:  04/15/2019 Time:  12:37 PM  Group Topic/Focus:  Self Esteem Action Plan:   The focus of this group is to help patients create a plan to continue to build self-esteem after discharge.  Participation Level:  Active  Participation Quality:  Appropriate  Affect:  Flat  Cognitive:  Appropriate  Insight: Improving  Engagement in Group:  Engaged  Modes of Intervention:  Activity, Discussion, Education and Socialization  Additional Comments:    S: "My cats help relieve stress"  O: OT tx with focus on self esteem building this date. Education given on definition of self esteem, with both causes of low and high self esteem identified. Activity given for pt to identify a positive/aspiring trait for each letter of the alphabet. Pt to work with peers to help complete activity and build positive thinking.   A: Pt presents with flat affect, engaged and participatory throughout session. Pt shares how his cats help to increase his self esteem, but job loss and financial strain can decrease his self esteem. Pt completed A-Z activity without VC's.  P: OT group will be x1 per week while pt inpatient.  Zenovia Jarred, MSOT, OTR/L Behavioral Health OT/ Acute Relief OT PHP Office: [place] 04/15/2019, 12:37 PM

## 2019-04-15 NOTE — Progress Notes (Signed)
Patient ID: Omar Krause, male   DOB: [birth year], 28 y.o.   MRN: [id number]  [place] NOVEL CORONAVIRUS (COVID-19) DAILY CHECK-OFF SYMPTOMS - answer yes or no to each - every day NO YES  Have you had a fever in the past 24 hours?  . Fever (Temp > 37.80C / 100F) X   Have you had any of these symptoms in the past 24 hours? . New Cough .  Sore Throat  .  Shortness of Breath .  Difficulty Breathing .  Unexplained Body Aches   X   Have you had any one of these symptoms in the past 24 hours not related to allergies?   . Runny Nose .  Nasal Congestion .  Sneezing   X   If you have had runny nose, nasal congestion, sneezing in the past 24 hours, has it worsened?  X   EXPOSURES - check yes or no X   Have you traveled outside the state in the past 14 days?  X   Have you been in contact with someone with a confirmed diagnosis of COVID-19 or PUI in the past 14 days without wearing appropriate PPE?  X   Have you been living in the same home as a person with confirmed diagnosis of COVID-19 or a PUI (household contact)?    X   Have you been diagnosed with COVID-19?    X              What to do next: Answered NO to all: Answered YES to anything:   Proceed with unit schedule Follow the [facility] Inpatient Flowsheet.

## 2019-04-16 MED ORDER — VENLAFAXINE HCL ER 75 MG PO CP24
225.0000 mg | ORAL_CAPSULE | Freq: Every day | ORAL | Status: DC
  Filled 2019-04-16: qty 3

## 2019-04-16 MED ORDER — LOSARTAN POTASSIUM 50 MG PO TABS: 50.0000 mg | ORAL_TABLET | Freq: Every day | ORAL | 0 refills | Status: DC

## 2019-04-16 MED ORDER — NICOTINE 21 MG/24HR TD PT24: 21.0000 mg | MEDICATED_PATCH | Freq: Every day | TRANSDERMAL | 0 refills | Status: DC

## 2019-04-16 MED ORDER — VENLAFAXINE HCL ER 75 MG PO CP24: 225.0000 mg | ORAL_CAPSULE | Freq: Every day | ORAL | 0 refills | Status: DC

## 2019-04-16 MED ORDER — OLANZAPINE 20 MG PO TABS: 20.0000 mg | ORAL_TABLET | Freq: Every day | ORAL | 0 refills | Status: DC

## 2019-04-16 MED ORDER — OXCARBAZEPINE 150 MG PO TABS: 75.0000 mg | ORAL_TABLET | Freq: Two times a day (BID) | ORAL | 0 refills | Status: DC

## 2019-04-16 NOTE — Discharge Summary (Addendum)
Physician Discharge Summary Note  Patient:  Omar Krause is an 28 y.o., male MRN:  [id number] DOB:  1990/12/31 Patient phone:  [phone] (home)  Patient address:   [address] [place] [id number],  Total Time spent with patient: 15 minutes  Date of Admission:  04/12/2019 Date of Discharge: 04/16/19  Reason for Admission:  Overdose on Benadryl and gabapentin  Principal Problem: Major depressive disorder, recurrent episode, severe (HCC) Discharge Diagnoses: Principal Problem:   Major depressive disorder, recurrent episode, severe (HCC)   Past Psychiatric History: Patient denied any previous psychiatric admissions.  He was followed by an outpatient psychiatrist until the fall 2019.  He had not returned to that physician.  His psychiatric medicines have been written by his primary care provider.  Past Medical History:  Past Medical History:  Diagnosis Date  . Anxiety   . Asthma    No inhaler at home, has not had issues since he was child  . Depression   . Hypertension   . Morbid obesity (HCC)   . Nausea   . Palpitations   . Seizures (HCC)     Past Surgical History:  Procedure Laterality Date  . CIRCUMCISION    . TONSILLECTOMY AND ADENOIDECTOMY     Family History:  Family History  Problem Relation Age of Onset  . Arthritis Mother   . Diabetes Mother   . Alcohol abuse Mother   . Alcohol abuse Father   . Arthritis Father   . Hyperlipidemia Father   . Stroke Father   . Hypertension Father   . Mental illness Father   . Diabetes Father   . Bipolar disorder Father   . Heart attack Father   . Arthritis Maternal Grandmother   . Stroke Maternal Grandmother   . Diabetes Maternal Grandmother   . Arthritis Maternal Grandfather   . Heart attack Maternal Grandfather 50  . Diabetes Maternal Grandfather    Family Psychiatric  History: Father with alcohol use disorder. Social History:  Social History   Substance and Sexual Activity  Alcohol Use No  . Frequency:  Never     Social History   Substance and Sexual Activity  Drug Use Yes  . Frequency: 4.0 times per week  . Types: Marijuana   Comment: last use 2 days ago    Social History   Socioeconomic History  . Marital status: Single    Spouse name: Not on file  . Number of children: 0  . Years of education: Not on file  . Highest education level: Bachelor's degree (e.g., BA, AB, BS)  Occupational History  . Occupation: Consulting civil engineertudent   Social Needs  . Financial resource strain: Not on file  . Food insecurity    Worry: Not on file    Inability: Not on file  . Transportation needs    Medical: Not on file    Non-medical: Not on file  Tobacco Use  . Smoking status: Current Every Day Smoker    Packs/day: 0.50    Types: Cigarettes  . Smokeless tobacco: Never Used  Substance and Sexual Activity  . Alcohol use: No    Frequency: Never  . Drug use: Yes    Frequency: 4.0 times per week    Types: Marijuana    Comment: last use 2 days ago  . Sexual activity: Not Currently    Birth control/protection: None  Lifestyle  . Physical activity    Days per week: Not on file    Minutes per session: Not on  file  . Stress: Not on file  Relationships  . Social Musicianconnections    Talks on phone: Not on file    Gets together: Not on file    Attends religious service: Not on file    Active member of club or organization: Not on file    Attends meetings of clubs or organizations: Not on file    Relationship status: Not on file  Other Topics Concern  . Not on file  Social History Narrative   Patient is right-handed. He states his mother lives with him. He lives in a one level home, 4 steps to enter. He drinks one energy drink a day. He walks his dog daily.    Hospital Course:  From admission H&P: Patient is a 28 year old male with a reported past psychiatric history significant for posttraumatic stress disorder as well as major depression who presented to the [facility] emergency department on 04/11/2019  after an intentional overdose. The patient stated that he had an ingested 500 mg of Benadryl and it attempt to become altered and "high", but suffered some form of a seizure. He stated he had taken the Benadryl because he had run out of marijuana and wanted to get high. He was afraid he was going to have another seizure, and then ingested 90-100 mg gabapentin capsules. He stated that he had been depressed recently and felt helpless, hopeless and worthless. He denies that today. He stated he quit his job at [employer] in April of this year after having a panic attack which was not controlled. He was previously treated by a psychiatrist, but had not seen them since somewhere between April and October 2019. He admitted to one previous suicide attempt approximately a year ago. He did not seek assistance for that. He denied any previous psychiatric admissions. His posttraumatic stress disorder was secondary to living with his alcoholic father. He continues currently on Zyprexa, venlafaxine and Klonopin. He did admit to auditory hallucinations in the evening before he goes to sleep. He stated that was the reason why they had started the Zyprexa in the first place. He was admitted to the hospital for evaluation and stabilization.  [name] was admitted after overdose on Benadryl and gabapentin. Zyprexa was continued. Effexor was increased, and Trileptal was started. He participated in group therapy on the unit. [facility]. He stabilized with medication and therapy. He was discharged on the medications listed below. He has shown improvement with improved mood, affect, sleep, appetite, and interaction. He denies any SI/HI/AVH and contracts for safety. He agrees to [facility] and Dr. Jimmey RalphParker (see below). He is provided with prescriptions for medications upon discharge. His mother is picking him up for discharge home.  Physical Findings: AIMS:  Facial and Oral Movements Muscles of Facial Expression: None, normal Lips and Perioral Area: None, normal Jaw: None, normal Tongue: None, normal,Extremity Movements Upper (arms, wrists, hands, fingers): None, normal Lower (legs, knees, ankles, toes): None, normal, Trunk Movements Neck, shoulders, hips: None, normal, Overall Severity Severity of abnormal movements (highest score from questions above): None, normal Incapacitation due to abnormal movements: None, normal Patient's awareness of abnormal movements (rate only patient's report): No Awareness, Dental Status Current problems with teeth and/or dentures?: No Does patient usually wear dentures?: No  CIWA:  CIWA-Ar Total: 1 COWS:  COWS Total Score: 2  Musculoskeletal: Strength & Muscle Tone: within normal limits Gait & Station: normal Patient leans: N/A  Psychiatric Specialty  Exam: Physical Exam  Nursing note and vitals reviewed. Constitutional: He is oriented to person, place, and time. He appears well-developed and well-nourished.  Cardiovascular: Normal rate.  Respiratory: Effort normal.  Neurological: He is alert and oriented to person, place, and time.    Review of Systems  Constitutional: Negative.   Psychiatric/Behavioral: Positive for depression (stable on medication) and substance abuse (THC). Negative for hallucinations and suicidal ideas. The patient is not nervous/anxious and does not have insomnia.     Blood pressure 129/79, pulse 75, temperature 97.6 F (36.4 C), temperature source Oral, resp. rate 16, height 6' (1.829 m), weight 136.1 kg, SpO2 100 %.Body mass index is 40.69 kg/m.  See MD's discharge SRA     Have you used any form of tobacco in the last 30 days? (Cigarettes, Smokeless Tobacco, Cigars, and/or Pipes): Yes  Has this patient used any form of tobacco in the last 30 days? (Cigarettes, Smokeless Tobacco, Cigars, and/or Pipes) Yes, a prescription for an FDA-approved medication for tobacco cessation  was offered at discharge.   Blood Alcohol level:  Lab Results  Component Value Date   [facility] <10 04/11/2019   ETH <11 08/13/2014    Metabolic Disorder Labs:  Lab Results  Component Value Date   HGBA1C 5.3 04/15/2019   MPG 105.41 04/15/2019   MPG 105 09/06/2017   No results found for: PROLACTIN Lab Results  Component Value Date   CHOL 219 (H) 04/15/2019   TRIG 199 (H) 04/15/2019   HDL 25 (L) 04/15/2019   CHOLHDL 8.8 04/15/2019   VLDL 40 04/15/2019   LDLCALC 154 (H) 04/15/2019   LDLCALC 108 (H) 09/06/2017    See Psychiatric Specialty Exam and Suicide Risk Assessment completed by Attending Physician prior to discharge.  Discharge destination:  Home  Is patient on multiple antipsychotic therapies at discharge:  No   Has Patient had three or more failed trials of antipsychotic monotherapy by history:  No  Recommended Plan for Multiple Antipsychotic Therapies: NA  Discharge Instructions    Discharge instructions   Complete by: As directed    Patient is instructed to take all prescribed medications as recommended. Report any side effects or adverse reactions to your outpatient psychiatrist. Patient is instructed to abstain from alcohol and illegal drugs while on prescription medications. In the event of worsening symptoms, patient is instructed to call the crisis hotline, 911, or go to the nearest emergency department for evaluation and treatment.     Allergies as of 04/16/2019      Reactions   Ceftin [cefuroxime Axetil] Itching, Other (See Comments)   Dizziness as well   Escitalopram Nausea And Vomiting   Prednisone    Worsens anxiety, causes panic attacks and insomnia.       Medication List    STOP taking these medications   chlordiazePOXIDE 10 MG capsule Commonly known as: LIBRIUM   clonazePAM 1 MG tablet Commonly known as: KLONOPIN   diphenhydrAMINE 25 mg capsule Commonly known as: BENADRYL   gabapentin 100 MG capsule Commonly known as: NEURONTIN    ondansetron 8 MG tablet Commonly known as: Zofran     TAKE these medications     Indication  albuterol 108 (90 Base) MCG/ACT inhaler Commonly known as: VENTOLIN HFA Inhale 2 puffs into the lungs every 6 (six) hours as needed for wheezing or shortness of breath.  Indication: Asthma   losartan 50 MG tablet Commonly known as: COZAAR Take 1 tablet (50 mg total) by mouth daily. Start taking on: April 17, 2019  Indication: High Blood Pressure Disorder   multivitamin with minerals Tabs tablet Take 1 tablet by mouth daily.  Indication: Supplementation   nicotine 21 mg/24hr patch Commonly known as: NICODERM CQ - dosed in mg/24 hours Place 1 patch (21 mg total) onto the skin daily. Start taking on: April 17, 2019  Indication: Nicotine Addiction   OLANZapine 20 MG tablet Commonly known as: ZyPREXA Take 1 tablet (20 mg total) by mouth at bedtime.  Indication: Mood   OXcarbazepine 150 MG tablet Commonly known as: TRILEPTAL Take 0.5 tablets (75 mg total) by mouth 2 (two) times daily.  Indication: Mood   venlafaxine XR 75 MG 24 hr capsule Commonly known as: EFFEXOR-XR Take 3 capsules (225 mg total) by mouth daily with breakfast. Start taking on: April 17, 2019 What changed:   how much to take  Another medication with the same name was removed. Continue taking this medication, and follow the directions you see here.  Indication: Mood      Follow-up Information    Vivi Barrack, MD Follow up on 04/21/2019.   Specialty: Family Medicine Why: Virtual medication management appointment is Tuesday, 6/30 at 9:40a.  A WebEx link will be sent to your email address for the appointment.  Contact information: [place] [address] [phone]        [facility], [facility] Follow up.   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  The therapist will contact you for an appointment.  Contact information: [place] [address] [phone]           Follow-up recommendations: Activity as tolerated. Diet as recommended by primary care physician. Keep all scheduled follow-up appointments as recommended.   Comments:   Patient is instructed to take all prescribed medications as recommended. Report any side effects or adverse reactions to your outpatient psychiatrist. Patient is instructed to abstain from alcohol and illegal drugs while on prescription medications. In the event of worsening symptoms, patient is instructed to call the crisis hotline, 911, or go to the nearest emergency department for evaluation and treatment.  Signed: Connye Burkitt, NP 04/16/2019, 1:57 PM   Patient seen, Suicide Assessment Completed.  Disposition Plan Reviewed

## 2019-04-16 NOTE — BHH Suicide Risk Assessment (Signed)
[facility]   Principal Problem: Major depressive disorder, recurrent episode, severe (HCC) Discharge Diagnoses: Principal Problem:   Major depressive disorder, recurrent episode, severe (HCC)   Total Time spent with patient: 30 minutes  Musculoskeletal: Strength & Muscle Tone: within normal limits Gait & Station: normal Patient leans: N/A  Psychiatric Specialty Exam: ROS denies headache, no chest pain, no shortness of breath, no cough, no fever, no vomiting , no diarrhea  Blood pressure 129/79, pulse 75, temperature 97.6 F (36.4 C), temperature source Oral, resp. rate 16, height 6' (1.829 m), weight 136.1 kg, SpO2 100 %.Body mass index is 40.69 kg/m.  General Appearance: Well Groomed  Eye Contact::  Good  Speech:  Normal Rate409  Volume:  Normal  Mood:  denies feeling depressed at this time, and describes mood as 9/10 with 10 being best   Affect:  Appropriate and mildly anxious   Thought Process:  Linear and Descriptions of Associations: Intact  Orientation:  Other:  fully alert and attentive  Thought Content:  no hallucinations, no delusions   Suicidal Thoughts:  No denies suicidal or self injurious ideations, denies homicidal or violent ideations  Homicidal Thoughts:  No  Memory:  recent and remote grossly intact   Judgement:  Other:  improving   Insight:  improving   Psychomotor Activity:  Normal  Concentration:  Good  Recall:  Good  Fund of Knowledge:Good  Language: Good  Akathisia:  Negative  Handed:  Right  AIMS (if indicated):     Assets:  Communication Skills Desire for Improvement Social Support  Sleep:  Number of Hours: 6.75  Cognition: WNL  ADL's:  Intact   Mental Status Per Nursing Assessment::   On Admission:  Suicidal ideation indicated by patient, Self-harm thoughts  Demographic Factors:  27, no children, lives with mother, currently unemployed   Loss Factors: Unemployment   Historical Factors: History of  depression, anxiety, no prior suicidal attempts, no prior history of psychiatric admissions .   Risk Reduction Factors:   Sense of responsibility to family and Positive coping skills or problem solving skills  Continued Clinical Symptoms:  Currently he reports feeling a lot better than on admission.  Presents with improved mood.  Affect is more reactive, slightly anxious.  No thought disorder.  No hallucinations, no delusions.  No suicidal ideations.  No homicidal ideations.  Future oriented, looking forward to returning home, helping his mother with daily activities, and getting a job in the near future. He states that recent overdoses were not suicidal and intention, rather he was trying to get "high".  This time states he realizes that this was a mistake and states that he is motivated in sobriety/abstinent and at this time "I am never going to do that again".   On unit presents calm, pleasant on approach, visible in dayroom. Denies medication side effects. We reviewed medication side effects, including potential risk of metabolic and motor side effects associated with Olanzapine. ( Of note, patient reports has been on Olanzapine for several months and states he has lost weight). BP improved, currently 129/79.  Cognitive Features That Contribute To Risk:  No gross cognitive deficits noted upon discharge. Is alert , attentive, and oriented x 3   Suicide Risk:  Mild:  Suicidal ideation of limited frequency, intensity, duration, and specificity.  There are no identifiable plans, no associated intent, mild dysphoria and related symptoms, good self-control (both objective and subjective assessment), few other risk factors, and identifiable protective factors, including available and  accessible social support.  Follow-up Information    Vivi Barrack, MD Follow up on 04/21/2019.   Specialty: Family Medicine Why: Virtual medication management appointment is Tuesday, 6/30 at 9:40a.  A WebEx link  will be sent to your email address for the appointment.  Contact information: [place] [address] [phone]           Plan Of Care/Follow-up recommendations:  Activity:  as tolerated  Diet:  heart healthy Tests:  NA Other:  See below  Patient is expressing readiness for discharge and is leaving unit in good spirits Plans to return home Plans to follow up as above  Jenne Campus, MD 04/16/2019, 9:33 AM

## 2019-04-16 NOTE — Progress Notes (Signed)
Patient ID: Omar Krause, male   DOB: 05-12-1991, 28 y.o.   MRN: [id number]  Discharge Note  Patient denies SI/HI and states readiness for discharge.  Written and verbal discharge instructions reviewed with the patient. Patient accepting to information and verbalized understanding with no concerns. All belongings returned to patient from the unit and secured lockers. Patient has completed their Suicide Safety Plan and has been provided Suicide Prevention resources. Patient provided an opportunity to complete and return Patient Satisfaction Survey.   Patient was safely escorted to the lobby for discharge. Patient discharged from [facility] with prescriptions, personal belongings, follow-up appointment in place and discharge paperwork.

## 2019-04-16 NOTE — Progress Notes (Signed)
  [facility] Adult Case Management Discharge Plan : Will you be returning to the same living situation after discharge:  Yes,  patient is returning home with his mother At discharge, do you have transportation home?: Yes,  patient's mother is picking him up Do you have the ability to pay for your medications: No.  Release of information consent forms completed and in the chart;  Patient's signature needed at discharge.  Patient to Follow up at: Follow-up Information    Vivi Barrack, MD Follow up on 04/21/2019.   Specialty: Family Medicine Why: Virtual medication management appointment is Tuesday, 6/30 at 9:40a.  A WebEx link will be sent to your email address for the appointment.  Contact information: [place] [address] [phone]        [facility] Follow up.   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  The therapist will contact you for an appointment.  Contact information: [place] [address] [phone]           Next level of care provider has access to [place] and Suicide Prevention discussed: Yes,  with the patient's mother  Have you used any form of tobacco in the last 30 days? (Cigarettes, Smokeless Tobacco, Cigars, and/or Pipes): Yes  Has patient been referred to the Quitline?: Patient refused referral  Patient has been referred for addiction treatment: N/A  Marylee Floras, [place] 04/16/2019, 10:39 AM

## 2019-04-16 NOTE — BHH Suicide Risk Assessment (Signed)
[facility] INPATIENT:  Family/Significant Other Suicide Prevention Education  Suicide Prevention Education:  Education Completed; with mother, Trig Mcbryar [phone]) has been identified by the patient as the family member/significant other with whom the patient will be residing, and identified as the person(s) who will aid the patient in the event of a mental health crisis (suicidal ideations/suicide attempt).  With written consent from the patient, the family member/significant other has been provided the following suicide prevention education, prior to the and/or following the discharge of the patient.  The suicide prevention education provided includes the following:  Suicide risk factors  Suicide prevention and interventions  National Suicide Hotline telephone number  [facility] assessment telephone number  [facility] Emergency Assistance [place] and/or Residential Mobile Crisis Unit telephone number  Request made of family/significant other to:  Remove weapons (e.g., guns, rifles, knives), all items previously/currently identified as safety concern.    Remove drugs/medications (over-the-counter, prescriptions, illicit drugs), all items previously/currently identified as a safety concern.  The family member/significant other verbalizes understanding of the suicide prevention education information provided.  The family member/significant other agrees to remove the items of safety concern listed above.  Edd Fabian reports that she does not have any questions or concerns regarding the patient discharging today. She reports that the patient will return home with her, and that she will pick the patient up at discharge.   Marylee Floras 04/16/2019, 9:18 AM

## 2019-04-20 ENCOUNTER — Telehealth: Payer: Self-pay | Admitting: Family Medicine

## 2019-04-20 NOTE — Telephone Encounter (Signed)
[facility] Nonclinical Telephone Record [facility] Client Site [facility] Type Call Who Is Calling Patient / Member / Family / Caregiver Caller Name [place] Phone Number [phone] Patient Name Omar Krause Patient DOB August 20, 1991 Call Type Message Only Information Provided Reason for Call Request for General Office Information Initial Comment Caller states that one of his family members was diagnosed with probable covid, that they got from him. He was in the behavioral health hospital and is wanting to let them know he thinks he has covid.

## 2019-04-21 ENCOUNTER — Ambulatory Visit: Payer: Self-pay | Admitting: Family Medicine

## 2019-04-21 NOTE — Telephone Encounter (Signed)
Noted  

## 2019-04-22 ENCOUNTER — Ambulatory Visit (INDEPENDENT_AMBULATORY_CARE_PROVIDER_SITE_OTHER): Payer: Self-pay | Admitting: Family Medicine

## 2019-04-22 DIAGNOSIS — F321 Major depressive disorder, single episode, moderate: Secondary | ICD-10-CM

## 2019-04-22 DIAGNOSIS — F411 Generalized anxiety disorder: Secondary | ICD-10-CM

## 2019-04-22 DIAGNOSIS — I10 Essential (primary) hypertension: Secondary | ICD-10-CM

## 2019-04-22 MED ORDER — PROPRANOLOL HCL ER 60 MG PO CP24: 60.0000 mg | ORAL_CAPSULE | Freq: Every day | ORAL | 0 refills | Status: DC

## 2019-04-22 NOTE — Assessment & Plan Note (Signed)
Continue losartan 50 mg daily.  Given his concurrent anxiety, also start propranolol 60 mg once daily.  Discussed potential side effects.  Recommend he continue home blood pressure monitoring with goal 140/90 or lower.  He will follow-up with me in a few weeks for this.

## 2019-04-22 NOTE — Assessment & Plan Note (Signed)
Continue current regimen of Zyprexa 20 mg daily, Effexor 225 mg daily, and Trileptal 75 mg twice daily.  He will be following up with behavioral specialist soon.

## 2019-04-22 NOTE — Assessment & Plan Note (Signed)
Stable.  C GAD A/P.  Continue Effexor to 25 mg daily, Trileptal 75 mg twice daily, and Zyprexa 20 mg daily.

## 2019-04-22 NOTE — Progress Notes (Signed)
    Chief Complaint:  Omar Krause is a 28 y.o. male who presents today for a virtual office visit with a chief complaint of depression/anxiety.   Assessment/Plan:  Generalized anxiety disorder Continue current regimen of Zyprexa 20 mg daily, Effexor 225 mg daily, and Trileptal 75 mg twice daily.  He will be following up with behavioral specialist soon.  HTN (hypertension) Continue losartan 50 mg daily.  Given his concurrent anxiety, also start propranolol 60 mg once daily.  Discussed potential side effects.  Recommend he continue home blood pressure monitoring with goal 140/90 or lower.  He will follow-up with me in a few weeks for this.  Depression, major, single episode, moderate (HCC) Stable.  C GAD A/P.  Continue Effexor to 25 mg daily, Trileptal 75 mg twice daily, and Zyprexa 20 mg daily.     Subjective:  HPI:  Depression/anxiety Patient was seen about a month ago for this.  Unfortunately he has had significant worsening of his symptoms and was recently admitted to behavioral health due with overdose of gabapentin and Benadryl.  Patient denies today that he had any suicidal ideation.  Tells me that he had severe anxiety and took several Benadryl to help with this.  He says without that he had a seizure and then took several doses of gabapentin to help combat this.  He had excessive grogginess and then went to the emergency room.  While in the ED, patient regained awareness and asked to go home however he was informed that he would be going to a psychiatric facility.  He agreed to this due to his worsening anxiety.  He was admitted to a behavioral health hospital for 4 days and was discharged on 04/16/2019.  While admitted, he was continued on the Zyprexa.  His dose of Effexor was increased to 225mg  daily and he was started on Trileptal 75mg  twice daily.   He was discharged about a week ago.  Overall thinks that his symptoms have been "okay".  Anxiety is much better controlled with the  above regimen.  He is planning on following up with behavioral therapy very soon.  Does not have any current SI or HI.  Essential hypertension Patient also reports that his blood pressure was elevated while admitted into the 160-170 range.  He has been taking his losartan 50 mg daily without any issues.  He is interested in starting a beta-blocker today to help with this intention also with his anxiety.  ROS: Per HPI  PMH: He reports that he has been smoking cigarettes. He has been smoking about 0.50 packs per day. He has never used smokeless tobacco. He reports current drug use. Frequency: 4.00 times per week. Drug: Marijuana. He reports that he does not drink alcohol.      Objective/Observations  Physical Exam: Gen: NAD, resting comfortably Pulm: Normal work of breathing Neuro: Grossly normal, moves all extremities Psych: Normal affect and thought content  Virtual Visit via Video   I connected with Omar Krause on 04/22/19 at 11:20 AM EDT by a video enabled telemedicine application and verified that I am speaking with the correct person using two identifiers. I discussed the limitations of evaluation and management by telemedicine and the availability of in person appointments. The patient expressed understanding and agreed to proceed.   Patient location: Home Provider location: [place] participating in the virtual visit: Myself and Patient     Algis Greenhouse. Jerline Pain, MD 04/22/2019 11:56 AM

## 2019-04-25 ENCOUNTER — Encounter: Payer: Self-pay | Admitting: Family Medicine

## 2019-04-29 ENCOUNTER — Encounter: Payer: Self-pay | Admitting: Family Medicine

## 2019-05-05 ENCOUNTER — Other Ambulatory Visit: Payer: Self-pay

## 2019-05-05 ENCOUNTER — Encounter: Payer: Self-pay | Admitting: Family Medicine

## 2019-05-05 MED ORDER — PROPRANOLOL HCL ER 120 MG PO CP24: 120.0000 mg | ORAL_CAPSULE | Freq: Every day | ORAL | 3 refills | Status: DC

## 2019-05-07 ENCOUNTER — Other Ambulatory Visit: Payer: Self-pay | Admitting: Family Medicine

## 2019-05-08 MED ORDER — CLONAZEPAM 0.5 MG PO TABS: 0.5000 mg | ORAL_TABLET | Freq: Two times a day (BID) | ORAL | 1 refills | Status: DC | PRN

## 2019-05-08 NOTE — Telephone Encounter (Signed)
Please advise 

## 2019-05-08 NOTE — Telephone Encounter (Signed)
Sent in rx for lower dose as he has requested.  Algis Greenhouse. Jerline Pain, MD 05/08/2019 8:46 AM

## 2019-05-09 ENCOUNTER — Encounter: Payer: Self-pay | Admitting: Family Medicine

## 2019-05-12 ENCOUNTER — Other Ambulatory Visit: Payer: Self-pay

## 2019-05-12 MED ORDER — MIRTAZAPINE 15 MG PO TABS: 15.0000 mg | ORAL_TABLET | Freq: Every day | ORAL | 2 refills | Status: DC

## 2019-05-20 ENCOUNTER — Telehealth: Payer: Self-pay | Admitting: Family Medicine

## 2019-05-20 NOTE — Telephone Encounter (Signed)
Medication Refill - Medication:  OXcarbazepine (TRILEPTAL) 150 MG tablet  Has the patient contacted their pharmacy? Yes.   Preferred Pharmacy (with phone number or street name):  [place], [place] - [address] [phone] (Phone) [phone] (Fax)   Agent: Please be advised that RX refills may take up to 3 business days. We ask that you follow-up with your pharmacy.

## 2019-05-22 NOTE — Telephone Encounter (Signed)
Rx request,this was never filled by you.

## 2019-05-24 ENCOUNTER — Encounter: Payer: Self-pay | Admitting: Family Medicine

## 2019-05-25 ENCOUNTER — Other Ambulatory Visit: Payer: Self-pay

## 2019-05-25 MED ORDER — VENLAFAXINE HCL ER 75 MG PO CP24: 75.0000 mg | ORAL_CAPSULE | Freq: Every day | ORAL | 1 refills | Status: DC

## 2019-05-25 MED ORDER — OXCARBAZEPINE 150 MG PO TABS: 75.0000 mg | ORAL_TABLET | Freq: Two times a day (BID) | ORAL | 3 refills | Status: DC

## 2019-05-25 NOTE — Telephone Encounter (Signed)
Rx sent to pharmacy   

## 2019-05-25 NOTE — Telephone Encounter (Signed)
Ok with me. Please place any necessary orders. 

## 2019-05-26 ENCOUNTER — Encounter: Payer: Self-pay | Admitting: Family Medicine

## 2019-05-28 ENCOUNTER — Encounter: Payer: Self-pay | Admitting: Family Medicine

## 2019-05-29 ENCOUNTER — Other Ambulatory Visit: Payer: Self-pay

## 2019-05-29 MED ORDER — GABAPENTIN 300 MG PO CAPS: 300.0000 mg | ORAL_CAPSULE | Freq: Three times a day (TID) | ORAL | 3 refills | Status: DC

## 2019-05-29 MED ORDER — MIRTAZAPINE 30 MG PO TABS: 30.0000 mg | ORAL_TABLET | Freq: Every day | ORAL | 3 refills | Status: DC

## 2019-05-29 MED ORDER — MIRTAZAPINE 30 MG PO TABS: 15.0000 mg | ORAL_TABLET | Freq: Every day | ORAL | 3 refills | Status: DC

## 2019-06-01 ENCOUNTER — Other Ambulatory Visit: Payer: Self-pay

## 2019-06-01 DIAGNOSIS — Z20822 Contact with and (suspected) exposure to covid-19: Secondary | ICD-10-CM

## 2019-06-02 LAB — NOVEL CORONAVIRUS, NAA: SARS-CoV-2, NAA: NOT DETECTED

## 2019-06-04 ENCOUNTER — Other Ambulatory Visit: Payer: Self-pay

## 2019-06-04 ENCOUNTER — Encounter: Payer: Self-pay | Admitting: Family Medicine

## 2019-06-04 MED ORDER — MIRTAZAPINE 30 MG PO TABS: 30.0000 mg | ORAL_TABLET | Freq: Every day | ORAL | 3 refills | Status: DC

## 2019-06-07 ENCOUNTER — Encounter: Payer: Self-pay | Admitting: Family Medicine

## 2019-06-09 ENCOUNTER — Other Ambulatory Visit: Payer: Self-pay

## 2019-06-09 MED ORDER — DICLOFENAC SODIUM 75 MG PO TBEC: 75.0000 mg | DELAYED_RELEASE_TABLET | Freq: Two times a day (BID) | ORAL | 0 refills | Status: DC

## 2019-06-09 MED ORDER — BUPROPION HCL ER (XL) 150 MG PO TB24: 150.0000 mg | ORAL_TABLET | Freq: Every day | ORAL | 3 refills | Status: DC

## 2019-06-10 ENCOUNTER — Encounter: Payer: Self-pay | Admitting: Family Medicine

## 2019-06-11 ENCOUNTER — Other Ambulatory Visit: Payer: Self-pay

## 2019-06-11 MED ORDER — GABAPENTIN 300 MG PO CAPS: 300.0000 mg | ORAL_CAPSULE | Freq: Three times a day (TID) | ORAL | 3 refills | Status: DC

## 2019-06-11 NOTE — Telephone Encounter (Signed)
Hi!    I'm writing you today to inform you I have successfully weaned off the Klonopin until I'm able to take it as needed. Given that I still have episodes, though not as long lasting or severe, I was wondering if it would be a good idea to go back to where we were months ago with me taking them as needed. I understand that might not be doable because of my cannabis use (quitting is going well).

## 2019-06-11 NOTE — Telephone Encounter (Signed)
Added to other open message   

## 2019-06-11 NOTE — Telephone Encounter (Signed)
It's another two message evening, I apologize. I'm writing you today because I've been doing some research on the topic of marijuana, since it DOES help my severe anxiety and depression more than anything else. We're getting somewhere with the current course of treatment, but I do still have symptoms as stated in my previous message. Would something like sativex would or marinol be off the table as a substitute for the plant?

## 2019-06-11 NOTE — Telephone Encounter (Signed)
Added to another open message  

## 2019-06-12 ENCOUNTER — Other Ambulatory Visit: Payer: Self-pay | Admitting: Family Medicine

## 2019-06-12 ENCOUNTER — Encounter: Payer: Self-pay | Admitting: Family Medicine

## 2019-06-12 NOTE — Telephone Encounter (Signed)
Requested medication (s) are due for refill today:  No   Requested medication (s) are on the active medication list:  yes  Future visit scheduled:  no  Last Refill: 06/04/19; #30; RF x 3   Note to clinic:  Pt. Requested refill because he threw his prescription away; attempted to call pt. To discuss this further.  Left vm to call office to discuss.  Please advise on if pt. Can get another prescription filled at this time.   Requested Prescriptions  Pending Prescriptions Disp Refills   mirtazapine (REMERON) 30 MG tablet 30 tablet 3    Sig: Take 1 tablet (30 mg total) by mouth at bedtime.     Psychiatry: Antidepressants - mirtazapine Failed - 06/12/2019 11:16 AM      Failed - Triglycerides in normal range and within 360 days    Triglycerides  Date Value Ref Range Status  04/15/2019 199 (H) <150 mg/dL Final         Failed - Total Cholesterol in normal range and within 360 days    Cholesterol  Date Value Ref Range Status  04/15/2019 219 (H) 0 - 200 mg/dL Final         Passed - AST in normal range and within 360 days    AST  Date Value Ref Range Status  04/11/2019 19 15 - 41 U/L Final         Passed - ALT in normal range and within 360 days    ALT  Date Value Ref Range Status  04/11/2019 26 0 - 44 U/L Final         Passed - WBC in normal range and within 360 days    WBC  Date Value Ref Range Status  04/11/2019 9.4 4.0 - 10.5 K/uL Final         Passed - Valid encounter within last 6 months    Recent Outpatient Visits          1 month ago Generalized anxiety disorder   [place] [facility], Algis Greenhouse, MD   2 months ago Generalized anxiety disorder   [place] [facility], Algis Greenhouse, MD   2 months ago Depression, major, single episode, moderate [facility])   [facility], Algis Greenhouse, MD   3 months ago Adverse effect of drug, initial encounter   [place], [place], [place]   3 months  ago Generalized anxiety disorder   [facility] [address] [place], Algis Greenhouse, MD             Passed - Completed PHQ-2 or PHQ-9 in the last 360 days.

## 2019-06-12 NOTE — Telephone Encounter (Signed)
See note

## 2019-06-12 NOTE — Telephone Encounter (Signed)
Pt states his mirtazipine was thrown away and that he is needing to get it refilled again. Please advise.   [place], [place] - [id number]  [address] [place] [address]  Phone: [phone] Fax: [phone]  Not a 24 hour pharmacy; exact hours not known.

## 2019-06-15 ENCOUNTER — Telehealth: Payer: Self-pay | Admitting: Family Medicine

## 2019-06-15 NOTE — Telephone Encounter (Signed)
[facility] [place] RECORD [facility] Patient Name: Omar Krause Gender: Male DOB: 1991-08-11 Age: 28 Y 11 M 11 D Return Phone Number: [phone] (Primary), [phone] (Secondary) Address: City/State/Zip: [place] [address] Client [facility] Client Site [facility] [place] Night Physician Dimas Chyle- MD Contact Type Call Who Is Calling Patient / Member / Family / Caregiver Call Type Triage / Clinical Relationship To Patient Self Return Phone Number [phone] (Primary) Chief Complaint Hearing Loss Reason for Call Symptomatic / Request for [place] states he has a decrease in hearing Translation No Nurse Assessment Guidelines Guideline Title Affirmed Question Affirmed Notes Nurse Date/Time (Eastern Time) Disp. Time Eilene Ghazi Time) Disposition Final User 06/14/2019 4:02:51 PM Attempt made - message left Oren Bracket 06/14/2019 4:15:49 PM FINAL ATTEMPT MADE - no message left

## 2019-06-15 NOTE — Telephone Encounter (Signed)
Could be a side effect of the diclofenac - would like for him to stop if he can for a few days and let me know if not improving.  Algis Greenhouse. Jerline Pain, MD 06/15/2019 2:20 PM

## 2019-06-15 NOTE — Telephone Encounter (Signed)
Patient stated both ears has decreased hearing and ringing in right ear started yesterday. Offered to schedule appt. patient declined.Please advise

## 2019-06-15 NOTE — Telephone Encounter (Signed)
Notified patient he voices understanding 

## 2019-06-18 ENCOUNTER — Encounter: Payer: Self-pay | Admitting: Family Medicine

## 2019-06-19 ENCOUNTER — Other Ambulatory Visit: Payer: Self-pay

## 2019-06-19 MED ORDER — LOSARTAN POTASSIUM 50 MG PO TABS: 50.0000 mg | ORAL_TABLET | Freq: Every day | ORAL | 3 refills | Status: DC

## 2019-06-22 ENCOUNTER — Encounter: Payer: Self-pay | Admitting: Family Medicine

## 2019-06-23 ENCOUNTER — Encounter: Payer: Self-pay | Admitting: Family Medicine

## 2019-06-25 ENCOUNTER — Encounter: Payer: Self-pay | Admitting: Family Medicine

## 2019-07-02 ENCOUNTER — Encounter: Payer: Self-pay | Admitting: Family Medicine

## 2019-07-03 NOTE — Telephone Encounter (Signed)
Left voice message for patient to call clinic.  

## 2019-07-05 ENCOUNTER — Encounter: Payer: Self-pay | Admitting: Family Medicine

## 2019-07-07 ENCOUNTER — Other Ambulatory Visit: Payer: Self-pay

## 2019-07-07 ENCOUNTER — Other Ambulatory Visit: Payer: Self-pay | Admitting: Family Medicine

## 2019-07-07 MED ORDER — ALPRAZOLAM 1 MG PO TABS: 1.0000 mg | ORAL_TABLET | Freq: Two times a day (BID) | ORAL | 0 refills | Status: DC | PRN

## 2019-07-07 MED ORDER — CLONAZEPAM 0.5 MG PO TABS: 0.5000 mg | ORAL_TABLET | Freq: Two times a day (BID) | ORAL | 1 refills | Status: DC | PRN

## 2019-07-07 NOTE — Progress Notes (Signed)
Last OV:  04/22/2019 Next OV:  None Last Fill:  06/05/2019  Please advise.

## 2019-07-10 ENCOUNTER — Other Ambulatory Visit: Payer: Self-pay

## 2019-07-10 ENCOUNTER — Encounter: Payer: Self-pay | Admitting: Family Medicine

## 2019-07-10 MED ORDER — PANTOPRAZOLE SODIUM 40 MG PO TBEC: 40.0000 mg | DELAYED_RELEASE_TABLET | Freq: Every day | ORAL | 0 refills | Status: DC

## 2019-07-11 ENCOUNTER — Other Ambulatory Visit: Payer: Self-pay | Admitting: Family Medicine

## 2019-07-27 ENCOUNTER — Encounter: Payer: Self-pay | Admitting: Family Medicine

## 2019-08-03 ENCOUNTER — Encounter: Payer: Self-pay | Admitting: *Deleted

## 2019-08-03 ENCOUNTER — Emergency Department (HOSPITAL_COMMUNITY)
Admission: EM | Admit: 2019-08-03 | Discharge: 2019-08-03 | Disposition: A | Discharge status: Home / Self Care | Payer: Self-pay | Attending: Emergency Medicine | Admitting: Emergency Medicine

## 2019-08-03 ENCOUNTER — Encounter (HOSPITAL_COMMUNITY): Payer: Self-pay | Admitting: *Deleted

## 2019-08-03 ENCOUNTER — Emergency Department (HOSPITAL_COMMUNITY): Payer: Self-pay

## 2019-08-03 ENCOUNTER — Other Ambulatory Visit: Payer: Self-pay

## 2019-08-03 ENCOUNTER — Encounter: Payer: Self-pay | Admitting: Family Medicine

## 2019-08-03 DIAGNOSIS — Z5321 Procedure and treatment not carried out due to patient leaving prior to being seen by health care provider: Secondary | ICD-10-CM | POA: Insufficient documentation

## 2019-08-03 NOTE — ED Triage Notes (Signed)
Pt going down the steps today and fell. Denies hitting his head, pt reached out with left hand and hit left middle finger while trying to reach for railing.  Pt states he is unable to bend left middle finger.  Pt is right handed.

## 2019-08-03 NOTE — Telephone Encounter (Signed)
This encounter was created in error - please disregard.

## 2019-08-03 NOTE — ED Notes (Signed)
Xray tech states that they have called for pt x 3 with no response, registration called and have not seen pt as well.

## 2019-08-06 ENCOUNTER — Encounter: Payer: Self-pay | Admitting: Family Medicine

## 2019-08-06 ENCOUNTER — Ambulatory Visit (INDEPENDENT_AMBULATORY_CARE_PROVIDER_SITE_OTHER): Payer: Self-pay | Admitting: Family Medicine

## 2019-08-06 ENCOUNTER — Other Ambulatory Visit: Payer: Self-pay

## 2019-08-06 VITALS — BP 150/91 | HR 59 | Temp 97.7°F | Ht 72.0 in | Wt 338.0 lb

## 2019-08-06 DIAGNOSIS — M79645 Pain in left finger(s): Secondary | ICD-10-CM

## 2019-08-06 MED ORDER — ACETAMINOPHEN-CODEINE #3 300-30 MG PO TABS: 1.0000 | ORAL_TABLET | ORAL | 0 refills | Status: DC | PRN

## 2019-08-06 MED ORDER — OLANZAPINE 20 MG PO TABS: 10.0000 mg | ORAL_TABLET | Freq: Every day | ORAL | 0 refills | Status: DC

## 2019-08-06 NOTE — Patient Instructions (Signed)
It was very nice to see you today!  I think you may have broken your finger.  Please keep the area taped for the next 1 to 2 weeks.  You can continue taking Tylenol and ibuprofen as needed.  I will also send in some Tylenol with codeine to help with severe pain  Please let me know if your symptoms do not improve the next 1 to 2 weeks.  Take care, Dr Jerline Pain  Please try these tips to maintain a healthy lifestyle:   Eat at least 3 REAL meals and 1-2 snacks per day.  Aim for no more than 5 hours between eating.  If you eat breakfast, please do so within one hour of getting up.    Obtain twice as many fruits/vegetables as protein or carbohydrate foods for both lunch and dinner. (Half of each meal should be fruits/vegetables, one quarter protein, and one quarter starchy carbs)   Cut down on sweet beverages. This includes juice, soda, and sweet tea.    Exercise at least 150 minutes every week.

## 2019-08-06 NOTE — Progress Notes (Signed)
   Chief Complaint:  Omar Krause is a 28 y.o. male who presents for same day appointment with a chief complaint of finger pain.   Assessment/Plan:  Finger pain/back pain/shoulder pain/hip pain Concern for fractured finger on his physical exam.  He is neurovascular intact distally.  Patient declined x-ray today.  Will place in a finger splint and buddy tape.  Advised him to leave this in place as much as possible over the next couple of weeks.  He can continue taking over-the-counter medications as needed for pain.  Also send in short supply of Tylenol 3.  Otherwise the rest of his exam is unremarkable.  Anticipate full improvement over the next couple weeks.  Discussed reasons return to care.  Follow-up as needed.     Subjective:  HPI:  Finger Pain Patient unfortunately suffered a fall a couple of days ago.  States that he was walking her dog when the leash became tangled behind his legs and he fell backwards.  He reached up to grab the ground with his left hand however was unable to grab it.  He fell on his back and his left side.  Since then he has had pain into the distal part of his left middle finger.  Is also having some right shoulder pain, bilateral back pain, and left hip pain.  He has tried taking ibuprofen, diclofenac, and Tylenol with modest improvement.  No other specific treatments tried.  Pain is mostly located in his finger.  Certain movements make the pain worse.  No reported weakness or numbness.  No other obvious aggravating or alleviating factors.  ROS: Per HPI  PMH: He reports that he has been smoking cigarettes. He has been smoking about 0.50 packs per day. He has never used smokeless tobacco. He reports previous drug use. Frequency: 4.00 times per week. Drug: Marijuana. He reports that he does not drink alcohol.      Objective:  Physical Exam: BP (!) 150/91   Pulse (!) 59   Temp 97.7 F (36.5 C)   Ht 6' (1.829 m)   Wt (!) 338 lb (153.3 kg)   SpO2 97%   BMI  45.84 kg/m   Wt Readings from Last 3 Encounters:  08/06/19 (!) 338 lb (153.3 kg)  08/03/19 (!) 320 lb (145.2 kg)  04/11/19 298 lb (135.2 kg)  Gen: NAD, resting comfortably MSK: -Neck: No deformities.  Full range of motion -Right upper extremity: No deformities.  Tender to palpation at right [facility] joint.  Full range of motion throughout.  Strength 5 out of 5 including rotator cuff testing.  Neurovascular intact distally.  Mild pain elicited with crossover test -Left upper extremity: Left middle finger with edema and erythema, predominantly at distal tip.  Distal phalanx of third digit very tender to palpation.  Limited range of motion due to pain and edema.  He is able to flex and extend this digit.  Neurovascular intact distally -Back: No deformities.  Tender to palpation along bilateral lower lumbar paraspinal muscles -Lower extremities: No deformities.  Tender to palpation along left greater trochanter.  Full range of motion in all directions.  Neurovascular intact distally Neuro: Normal Gait     Jametta Moorehead M. Jerline Pain, MD 08/06/2019 3:34 PM

## 2019-08-12 ENCOUNTER — Other Ambulatory Visit: Payer: Self-pay | Admitting: Family Medicine

## 2019-08-13 NOTE — Telephone Encounter (Signed)
Please advise 

## 2019-08-16 ENCOUNTER — Other Ambulatory Visit: Payer: Self-pay | Admitting: Family Medicine

## 2019-08-21 ENCOUNTER — Encounter: Payer: Self-pay | Admitting: Family Medicine

## 2019-08-24 NOTE — Telephone Encounter (Signed)
Please call pt and schedule virtual appointment with Dr. Jerline Pain for tomorrow.

## 2019-08-24 NOTE — Telephone Encounter (Signed)
LVM FOR PATIENT TO CALL BACK AND SCHEDULE A VIRTUAL VISIT

## 2019-09-01 ENCOUNTER — Other Ambulatory Visit: Payer: Self-pay | Admitting: Family Medicine

## 2019-09-10 ENCOUNTER — Other Ambulatory Visit: Payer: Self-pay | Admitting: Family Medicine

## 2019-09-12 IMAGING — MR MR HEAD WO/W CM
14 series · 48 of 48 positions shown · IV contrast (multihance)
Comparison: 20 cc MultiHance

CLINICAL DATA: Transient alteration of awareness. Panic attacks.
Blackouts. Seizure-like activity. Duration of symptoms 1 month.

EXAM:
MRI HEAD WITHOUT AND WITH CONTRAST
TECHNIQUE: Multiplanar, multiecho pulse sequences of the brain and surrounding
structures were obtained without and with intravenous contrast.
CONTRAST:  20mL MULTIHANCE GADOBENATE DIMEGLUMINE 529 MG/ML IV SOLN

[Series 5: T1 · sagittal · 4.0mm · 0.75mm/px · 3 of 39 slices shown (1 of 3)]
[im 1/39]
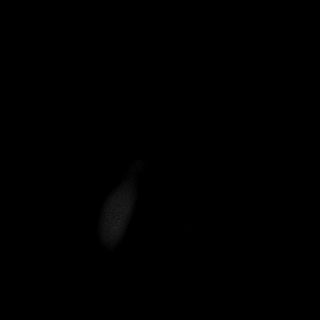
[im 20/39]
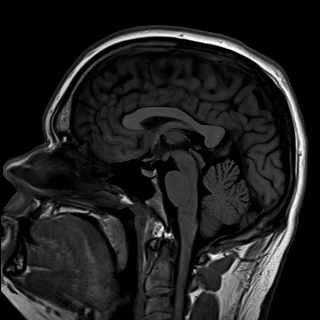
[im 39/39]
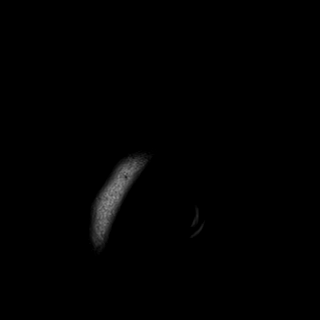

[Series 6: DWI · axial · 3.0mm · 1.50mm/px · z∈[-81,+93]mm · 7 of 108 slices shown (1 of 4)]
[im 1/108]
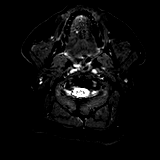
[im 18/108]
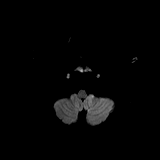
[im 36/108]
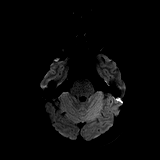
[im 54/108]
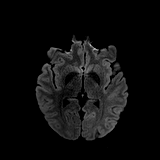
[im 72/108]
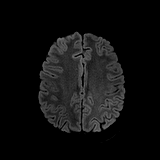
[im 90/108]
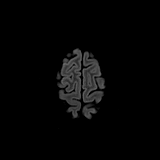
[im 108/108]
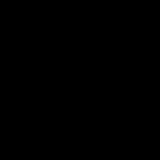

[Series 7: DWI · axial · 3.0mm · 1.50mm/px · z∈[-81,+93]mm · 3 of 54 slices shown (2 of 4)]
[im 1/54]
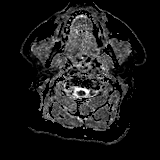
[im 27/54]
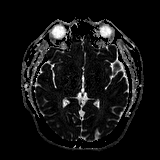
[im 54/54]
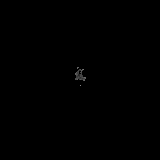

[Series 8: DWI · coronal · 5.0mm · 1.44mm/px · 4 of 71 slices shown (3 of 4)]
[im 1/71]
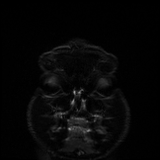
[im 24/71]
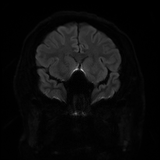
[im 47/71]
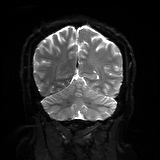
[im 71/71]
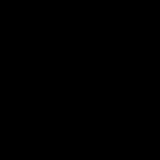

[Series 9: DWI · coronal · 5.0mm · 1.44mm/px · 2 of 35 slices shown (4 of 4)]
[im 1/35]
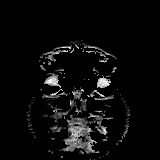
[im 35/35]
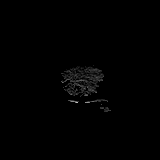

[Series 10: T2 · axial · 4.0mm · 0.38mm/px · z∈[-79,+87]mm · 2 of 33 slices shown (1 of 2)]
[im 1/33]
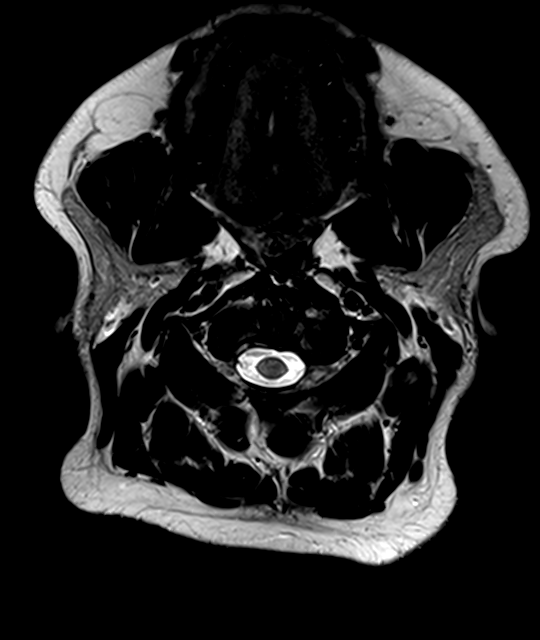
[im 33/33]
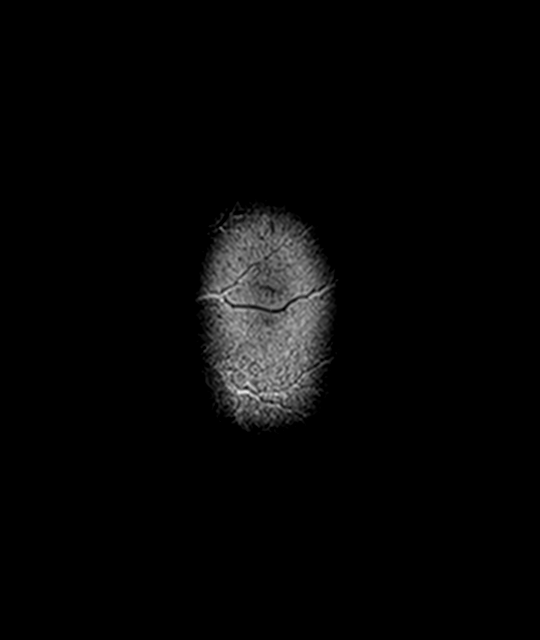

[Series 11: T2 · coronal · 3.0mm · 0.47mm/px · 1 of 25 slices shown (2 of 2)]
[im 1/25]
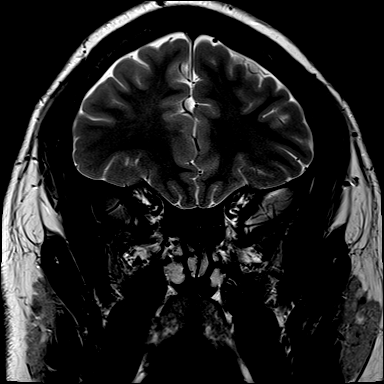

[Series 12: FLAIR · coronal · 3.0mm · 0.56mm/px · 1 of 25 slices shown (1 of 2)]
[im 1/25]
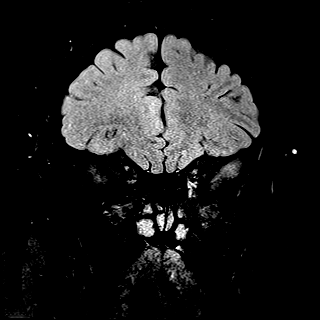

[Series 13: FLAIR · axial · 3.0mm · 0.72mm/px · 1 of 26 slices shown (2 of 2)]
[im 1/26]
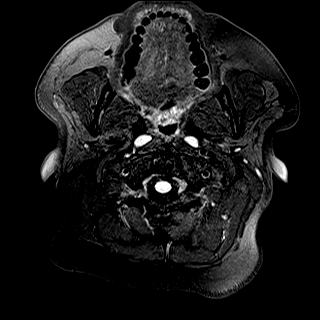

[Series 15: swi_images · axial · 4.0mm · 0.94mm/px · z∈[-85,+86]mm · 2 of 44 slices shown]
[im 1/44]
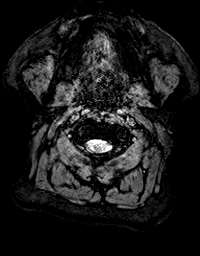
[im 44/44]
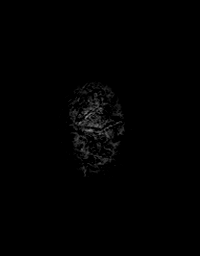

[Series 16: T1 · axial · 1.0mm · 0.94mm/px · z∈[-66,+93]mm · 9 of 160 slices shown (2 of 3)]
[im 1/160]
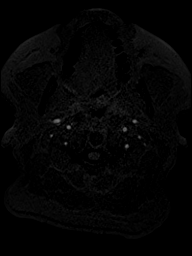
[im 20/160]
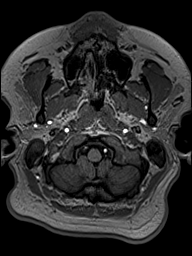
[im 40/160]
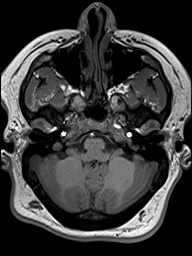
[im 60/160]
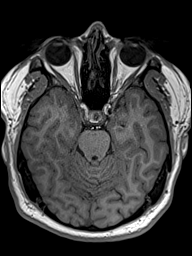
[im 80/160]
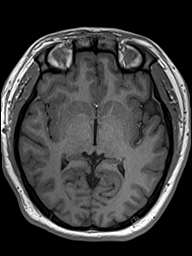
[im 100/160]
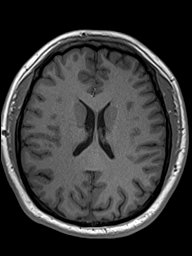
[im 120/160]
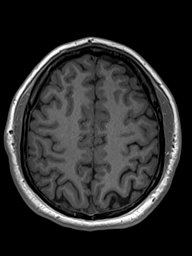
[im 140/160]
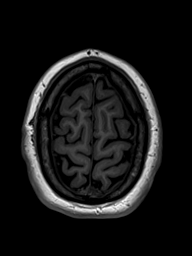
[im 160/160]
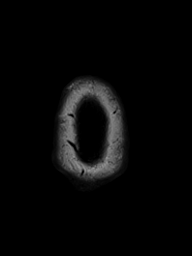

[Series 17: T2 post-contrast · coronal · 4.0mm · 0.38mm/px · 2 of 37 slices shown]
[im 1/37]
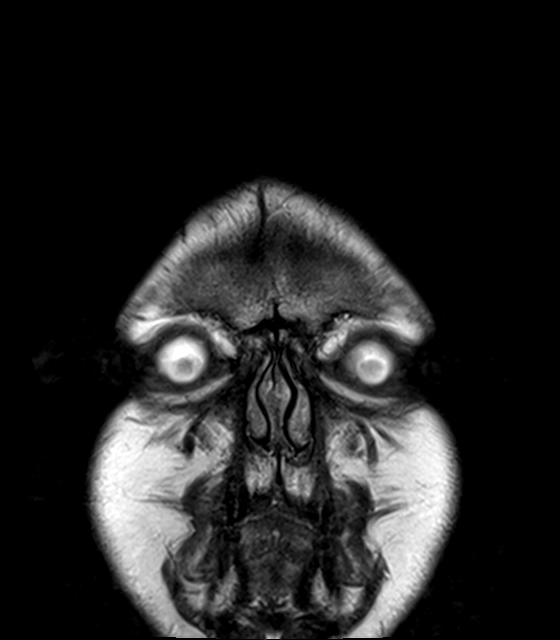
[im 37/37]
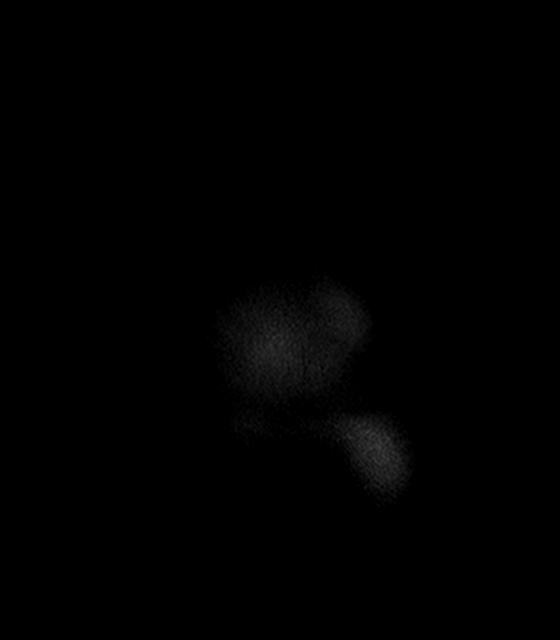

[Series 18: T1 · axial · 1.0mm · 0.94mm/px · z∈[-66,+93]mm · 9 of 160 slices shown (3 of 3)]
[im 1/160]
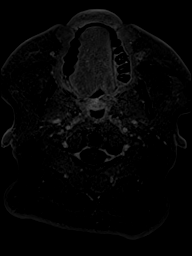
[im 20/160]
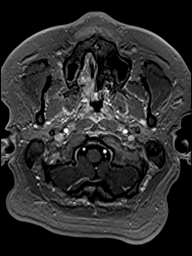
[im 40/160]
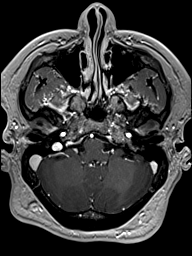
[im 60/160]
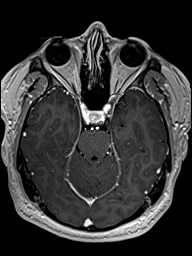
[im 80/160]
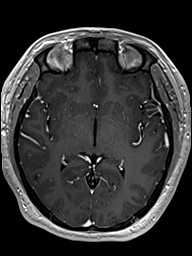
[im 100/160]
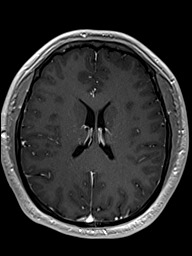
[im 120/160]
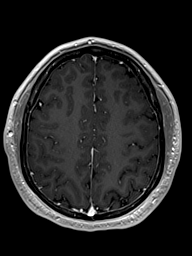
[im 140/160]
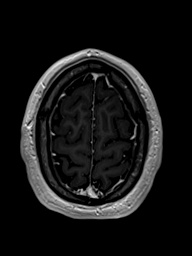
[im 160/160]
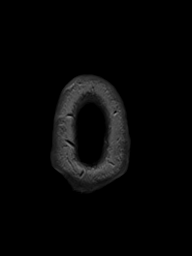

[Series 19: T1 post-contrast · coronal · 4.0mm · 0.72mm/px · 2 of 37 slices shown]
[im 1/37]
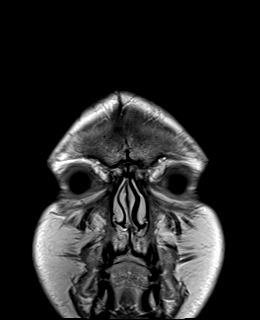
[im 37/37]
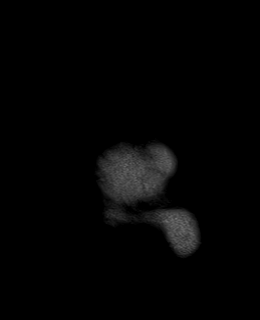

[48 of 48 positions shown; findings below may reference images not displayed]

FINDINGS: Brain: The brain has a normal appearance without evidence of
malformation, atrophy, old or acute small or large vessel
infarction, mass lesion, hemorrhage, hydrocephalus or extra-axial
collection. Mesial temporal lobes appear normal and symmetric. After
contrast administration, no abnormal enhancement occurs.

Vascular: Major vessels at the base of the brain show flow. Venous
sinuses appear patent.

Skull and upper cervical spine: Normal.

Sinuses/Orbits: Clear/normal.

Other: None significant.
IMPRESSION: Normal examination. No abnormality seen to explain the presenting
symptoms.

## 2019-10-02 ENCOUNTER — Other Ambulatory Visit: Payer: Self-pay | Admitting: Family Medicine

## 2019-10-11 ENCOUNTER — Encounter: Payer: Self-pay | Admitting: Emergency Medicine

## 2019-10-11 ENCOUNTER — Other Ambulatory Visit: Payer: Self-pay

## 2019-10-11 ENCOUNTER — Ambulatory Visit (INDEPENDENT_AMBULATORY_CARE_PROVIDER_SITE_OTHER): Payer: Self-pay

## 2019-10-11 ENCOUNTER — Other Ambulatory Visit: Payer: Self-pay | Admitting: Family Medicine

## 2019-10-11 ENCOUNTER — Ambulatory Visit
Admission: EM | Admit: 2019-10-11 | Discharge: 2019-10-11 | Disposition: A | Discharge status: Home / Self Care | Payer: Self-pay | Attending: Emergency Medicine | Admitting: Emergency Medicine

## 2019-10-11 DIAGNOSIS — R059 Cough, unspecified: Secondary | ICD-10-CM

## 2019-10-11 DIAGNOSIS — Z20822 Contact with and (suspected) exposure to covid-19: Secondary | ICD-10-CM

## 2019-10-11 DIAGNOSIS — R05 Cough: Secondary | ICD-10-CM

## 2019-10-11 DIAGNOSIS — R0602 Shortness of breath: Secondary | ICD-10-CM

## 2019-10-11 DIAGNOSIS — Z20828 Contact with and (suspected) exposure to other viral communicable diseases: Secondary | ICD-10-CM

## 2019-10-11 MED ORDER — ALBUTEROL SULFATE HFA 108 (90 BASE) MCG/ACT IN AERS: 1.0000 | INHALATION_SPRAY | Freq: Four times a day (QID) | RESPIRATORY_TRACT | 0 refills | Status: DC | PRN

## 2019-10-11 MED ORDER — BENZONATATE 100 MG PO CAPS: 100.0000 mg | ORAL_CAPSULE | Freq: Three times a day (TID) | ORAL | 0 refills | Status: DC

## 2019-10-11 NOTE — Discharge Instructions (Addendum)
Chest X-ray was negative COVID testing ordered.  It will take between 2-7 days for test results.  Someone will contact you regarding abnormal results.    In the meantime: You should remain isolated in your home for 10 days from symptom onset  Get plenty of rest and push fluids Tessalon Perles prescribed for cough Use medications daily for symptom relief Use OTC medications like ibuprofen or tylenol as needed fever or pain Call or go to the ED if you have any new or worsening symptoms such as fever, worsening cough, shortness of breath, chest tightness, chest pain, turning blue, changes in mental status, etc..Marland Kitchen

## 2019-10-11 NOTE — ED Triage Notes (Signed)
Pt presents with c/o cough for past 2 weeks, pt is tachyonic with ambulation and states he fell mild sob

## 2019-10-11 NOTE — ED Provider Notes (Addendum)
[facility]    CSN: [id number] Arrival date & time: 10/11/19  1142      History   Chief Complaint Chief Complaint  Patient presents with  . Cough  . Shortness of Breath    HPI Omar Krause is a 28 y.o. male.   Omar Krause presented to the urgent care with a complaint of cough and shortness of breath for the past 2 weeks.  Denies sick exposure to COVID, flu or strep.  Denies recent travel.  Denies aggravating or alleviating symptoms.  Denies previous COVID infection.   Denies fever, chills, fatigue, nasal congestion, rhinorrhea, sore throat,  wheezing, chest pain, nausea, vomiting, changes in bowel or bladder habits.    The history is provided by the patient. No language interpreter was used.  Cough Associated symptoms: shortness of breath   Shortness of Breath Associated symptoms: cough     Past Medical History:  Diagnosis Date  . Anxiety   . Asthma    No inhaler at home, has not had issues since he was child  . Depression   . Hypertension   . Morbid obesity (HCC)   . Nausea   . Palpitations   . Seizures [facility](HCC)     Patient Active Problem List   Diagnosis Date Noted  . PTSD (post-traumatic stress disorder) 07/16/2018  . Carpal tunnel syndrome of right wrist 05/02/2018  . Depression, major, single episode, moderate (HCC) 03/31/2018  . Insomnia 03/12/2016  . Marijuana use 03/12/2016  . Asthma, mild intermittent 09/14/2015  . HTN (hypertension) 02/02/2014  . Generalized anxiety disorder 02/02/2014  . Morbid obesity (HCC) 07/08/2013    Past Surgical History:  Procedure Laterality Date  . CIRCUMCISION    . TONSILLECTOMY AND ADENOIDECTOMY         Home Medications    Prior to Admission medications   Medication Sig Start Date End Date Taking? Authorizing Provider  acetaminophen-codeine (TYLENOL #3) 300-30 MG tablet Take 1-2 tablets by mouth every 4 (four) hours as needed for moderate pain. 08/06/19   Ardith DarkParker, Caleb M, MD  albuterol  (VENTOLIN HFA) 108 (90 Base) MCG/ACT inhaler Inhale 2 puffs into the lungs every 6 (six) hours as needed for wheezing or shortness of breath. 03/17/19   Ardith DarkParker, Caleb M, MD  ALPRAZolam (XANAX) 1 MG tablet TAKE 1 TABLET (1 MG TOTAL) BY MOUTH 2 (TWO) TIMES DAILY AS NEEDED FOR ANXIETY. 08/13/19   Ardith DarkParker, Caleb M, MD  gabapentin (NEURONTIN) 300 MG capsule Take 1 capsule (300 mg total) by mouth 3 (three) times daily. 06/11/19   Ardith DarkParker, Caleb M, MD  losartan (COZAAR) 50 MG tablet Take 1 tablet (50 mg total) by mouth daily. 06/19/19   Ardith DarkParker, Caleb M, MD  mirtazapine (REMERON) 30 MG tablet Take 1 tablet (30 mg total) by mouth at bedtime. 06/04/19   Ardith DarkParker, Caleb M, MD  Multiple Vitamin (MULTIVITAMIN WITH MINERALS) TABS tablet Take 1 tablet by mouth daily.    [provider]  nicotine (NICODERM CQ - DOSED IN MG/24 HOURS) 21 mg/24hr patch Place 1 patch (21 mg total) onto the skin daily. 04/17/19   Aldean BakerSykes, Janet E, NP  OLANZapine (ZYPREXA) 10 MG tablet TAKE 1 TABLET BY MOUTH AT BEDTIME 09/10/19   Ardith DarkParker, Caleb M, MD  OLANZapine (ZYPREXA) 20 MG tablet Take 0.5 tablets (10 mg total) by mouth at bedtime. 08/06/19   Ardith DarkParker, Caleb M, MD  ondansetron (ZOFRAN) 8 MG tablet TAKE 1 TABLET BY MOUTH EVERY 8 HOURS AS NEEDED FOR NAUSEA  OR VOMITING 09/01/19   Vivi Barrack, MD  OXcarbazepine (TRILEPTAL) 150 MG tablet Take 0.5 tablets (75 mg total) by mouth 2 (two) times daily. 05/25/19   Vivi Barrack, MD  pantoprazole (PROTONIX) 40 MG tablet Take 1 tablet (40 mg total) by mouth daily. 07/10/19   Vivi Barrack, MD  propranolol ER (INDERAL LA) 120 MG 24 hr capsule Take 1 capsule (120 mg total) by mouth daily. 05/05/19   Vivi Barrack, MD  venlafaxine XR (EFFEXOR-XR) 150 MG 24 hr capsule TAKE 1 CAPSULE BY MOUTH ONCE DAILY WITH BREAKFAST 10/05/19   Vivi Barrack, MD  venlafaxine XR (EFFEXOR-XR) 75 MG 24 hr capsule Take 1 capsule (75 mg total) by mouth daily with breakfast. 05/25/19   Vivi Barrack, MD    Family  History Family History  Problem Relation Age of Onset  . Arthritis Mother   . Diabetes Mother   . Alcohol abuse Mother   . Alcohol abuse Father   . Arthritis Father   . Hyperlipidemia Father   . Stroke Father   . Hypertension Father   . Mental illness Father   . Diabetes Father   . Bipolar disorder Father   . Heart attack Father   . Arthritis Maternal Grandmother   . Stroke Maternal Grandmother   . Diabetes Maternal Grandmother   . Arthritis Maternal Grandfather   . Heart attack Maternal Grandfather 50  . Diabetes Maternal Grandfather     Social History Social History   Tobacco Use  . Smoking status: Current Every Day Smoker    Packs/day: 0.50    Types: Cigarettes  . Smokeless tobacco: Never Used  Substance Use Topics  . Alcohol use: No  . Drug use: Not Currently    Frequency: 4.0 times per week    Types: Marijuana    Comment: denies use, last used weeks ago per pt     Allergies   Ceftin [cefuroxime axetil], Escitalopram, and Prednisone   Review of Systems Review of Systems  Constitutional: Negative.   HENT: Negative.   Respiratory: Positive for cough and shortness of breath.   Cardiovascular: Negative.   Gastrointestinal: Negative.   Neurological: Negative.   ROS: All other are negatives   Physical Exam Triage Vital Signs ED Triage Vitals  Enc Vitals Group     BP 10/11/19 1156 (!) 156/77     Pulse Rate 10/11/19 1156 60     Resp 10/11/19 1156 (!) 24     Temp 10/11/19 1156 98.9 F (37.2 C)     Temp src --      SpO2 10/11/19 1156 98 %     Weight --      Height --      Head Circumference --      Peak Flow --      Pain Score 10/11/19 1153 0     Pain Loc --      Pain Edu? --      Excl. in [place]? --    No data found.  Updated Vital Signs BP (!) 156/77   Pulse 60   Temp 98.9 F (37.2 C)   Resp (!) 24   SpO2 98%   Visual Acuity Right Eye Distance:   Left Eye Distance:   Bilateral Distance:    Right Eye Near:   Left Eye Near:      Bilateral Near:     Physical Exam Vitals and nursing note reviewed.  Constitutional:      General:  He is not in acute distress.    Appearance: Normal appearance. He is normal weight. He is not ill-appearing or toxic-appearing.  HENT:     Head: Normocephalic.     Right Ear: Tympanic membrane, ear canal and external ear normal. There is no impacted cerumen.     Left Ear: Ear canal and external ear normal. There is no impacted cerumen.     Nose: Nose normal. No congestion.     Mouth/Throat:     Mouth: Mucous membranes are moist.     Pharynx: No oropharyngeal exudate or posterior oropharyngeal erythema.  Cardiovascular:     Rate and Rhythm: Normal rate and regular rhythm.     Pulses: Normal pulses.     Heart sounds: Normal heart sounds. No murmur.  Pulmonary:     Effort: Pulmonary effort is normal. No respiratory distress.     Breath sounds: Normal breath sounds. No stridor. No wheezing, rhonchi or rales.  Chest:     Chest wall: No tenderness.  Abdominal:     General: Abdomen is flat. Bowel sounds are normal. There is no distension.     Palpations: There is no mass.  Skin:    Capillary Refill: Capillary refill takes less than 2 seconds.  Neurological:     Mental Status: He is alert and oriented to person, place, and time.      UC Treatments / Results  Labs (all labs ordered are listed, but only abnormal results are displayed) Labs Reviewed  NOVEL CORONAVIRUS, NAA    EKG   Radiology No results found.  Procedures Procedures (including critical care time)  Medications Ordered in UC Medications - No data to display  Initial Impression / Assessment and Plan / UC Course  I have reviewed the triage vital signs and the nursing notes.  Pertinent labs & imaging results that were available during my care of the patient were reviewed by me and considered in my medical decision making (see chart for details).   Chest x-ray was negative. Patient stable for discharge.   Benign physical exam.  COVID-19 test was ordered.  Tessalon Perles and proair was prescribed.  Advised patient to quarantine until results become available.  Go to ED for worsening symptoms.  Patient verbalized understanding of the plan of care.  Final Clinical Impressions(s) / UC Diagnoses   Final diagnoses:  Suspected COVID-19 virus infection  Cough  SOB (shortness of breath)     Discharge Instructions     COVID testing ordered.  It will take between 2-7 days for test results.  Someone will contact you regarding abnormal results.    In the meantime: You should remain isolated in your home for 10 days from symptom onset  Get plenty of rest and push fluids Tessalon Perles prescribed for cough Use medications daily for symptom relief Use OTC medications like ibuprofen or tylenol as needed fever or pain Call or go to the ED if you have any new or worsening symptoms such as fever, worsening cough, shortness of breath, chest tightness, chest pain, turning blue, changes in mental status, etc...     ED Prescriptions    None     PDMP not reviewed this encounter.   Durward Parcel, FNP 10/11/19 1234    Durward Parcel, FNP 10/11/19 1435

## 2019-10-12 LAB — NOVEL CORONAVIRUS, NAA: SARS-CoV-2, NAA: NOT DETECTED

## 2019-10-26 ENCOUNTER — Encounter: Payer: Self-pay | Admitting: Family Medicine

## 2019-10-29 ENCOUNTER — Encounter: Payer: Self-pay | Admitting: Family Medicine

## 2019-10-29 ENCOUNTER — Ambulatory Visit (INDEPENDENT_AMBULATORY_CARE_PROVIDER_SITE_OTHER): Payer: Self-pay | Admitting: Family Medicine

## 2019-10-29 DIAGNOSIS — F411 Generalized anxiety disorder: Secondary | ICD-10-CM

## 2019-10-29 DIAGNOSIS — M25511 Pain in right shoulder: Secondary | ICD-10-CM

## 2019-10-29 DIAGNOSIS — F321 Major depressive disorder, single episode, moderate: Secondary | ICD-10-CM

## 2019-10-29 DIAGNOSIS — I10 Essential (primary) hypertension: Secondary | ICD-10-CM

## 2019-10-29 MED ORDER — ALPRAZOLAM 2 MG PO TABS: 1.0000 mg | ORAL_TABLET | Freq: Two times a day (BID) | ORAL | 5 refills | Status: DC | PRN

## 2019-10-29 MED ORDER — OLANZAPINE 5 MG PO TABS: 5.0000 mg | ORAL_TABLET | Freq: Every day | ORAL | 5 refills | Status: DC

## 2019-10-29 NOTE — Assessment & Plan Note (Addendum)
Stable.  We will continue Effexor 225 mg daily, gabapentin 300 mg 3 times daily, and Remeron 30 mg at bed.  Decrease Zyprexa to 5 mg daily and increase Xanax to 2 mg twice daily as needed.  Follow-up in a few weeks.

## 2019-10-29 NOTE — Progress Notes (Signed)
   Omar Krause is a 29 y.o. male who presents today for a virtual office visit.  Assessment/Plan:  New/Acute Problems: Right Shoulder Pain No red flags.  Likely rhomboid strain.  Recommended ice and over-the-counter NSAIDs.  Discussed home exercises.  Chronic Problems Addressed Today: Generalized anxiety disorder Stable.  We will continue Effexor 225 mg daily, gabapentin 300 mg 3 times daily, and Remeron 30 mg at bed.  Decrease Zyprexa to 5 mg daily and increase Xanax to 2 mg twice daily as needed.  Follow-up in a few weeks.  Depression, major, single episode, moderate (HCC) Stable.  Continue Zyprexa 5 mg daily.  HTN (hypertension) Discussed home monitoring.  He will buy home blood pressure cuff.  Continue losartan 50 mg daily and propranolol 120 mg daily.       Subjective:  HPI:  Patient last seen about 2 months ago.  [place] been doing well however having more panic attacks.  Mostly related to the status his mother's health.  States that his mother's health has declined significantly in the last several weeks.  He is currently taking Zyprexa 10 mg daily, Effexor 225 mg daily, Remeron 30 mg at bedtime, gabapentin 300 mg 3 times daily and Xanax 1 mg twice daily.  Always are doing well with exception of Xanax which she does not feels that effective.  He would like to increase the dose.  Has also been having some right shoulder pain over the last day or so.  Injured it while picking up his dog.  Has pain mostly in the right shoulder blade. He is able to lift his hands above his head without difficulty.       Objective/Observations  Physical Exam: Gen: NAD, resting comfortably Pulm: Normal work of breathing Neuro: Grossly normal, moves all extremities Psych: Normal affect and thought content  Virtual Visit via Video   I connected with Omar Krause on 10/29/19 at  4:00 PM EST by a video enabled telemedicine application and verified that I am speaking with the correct person  using two identifiers. The limitations of evaluation and management by telemedicine and the availability of in person appointments were discussed. The patient expressed understanding and agreed to proceed.   Patient location: Home Provider location: [place] [employer] Persons participating in the virtual visit: Myself and Patient     [name]. [name], MD 10/29/2019 4:28 PM

## 2019-10-29 NOTE — Assessment & Plan Note (Signed)
Stable.  Continue Zyprexa 5 mg daily.

## 2019-10-29 NOTE — Assessment & Plan Note (Signed)
Discussed home monitoring.  He will buy home blood pressure cuff.  Continue losartan 50 mg daily and propranolol 120 mg daily.

## 2019-10-30 ENCOUNTER — Encounter: Payer: Self-pay | Admitting: Family Medicine

## 2019-11-03 ENCOUNTER — Other Ambulatory Visit: Payer: Self-pay

## 2019-11-03 ENCOUNTER — Telehealth: Payer: Self-pay | Admitting: Family Medicine

## 2019-11-03 DIAGNOSIS — F411 Generalized anxiety disorder: Secondary | ICD-10-CM

## 2019-11-03 DIAGNOSIS — F321 Major depressive disorder, single episode, moderate: Secondary | ICD-10-CM

## 2019-11-03 NOTE — Telephone Encounter (Signed)
Spoke with patient gave number to schedule appt with Misty Stanley

## 2019-11-03 NOTE — Telephone Encounter (Signed)
Patient wanted to know if he could have a referral for a physiatrist, he told me he spoke to [facility] about this at last appt. He states he does not have a preference just not the one he previously seen in [place]

## 2019-11-03 NOTE — Telephone Encounter (Signed)
Referral Placed 

## 2019-11-03 NOTE — Telephone Encounter (Signed)
Pt called asking for referral to a Psychologist. Pt stated Dr. [name] recommended [facility]ling. Please advise.

## 2019-11-04 NOTE — Telephone Encounter (Signed)
See TE 11/03/2019

## 2019-11-09 ENCOUNTER — Encounter: Payer: Self-pay | Admitting: Family Medicine

## 2019-11-09 ENCOUNTER — Other Ambulatory Visit: Payer: Self-pay | Admitting: Family Medicine

## 2019-11-10 NOTE — Telephone Encounter (Signed)
Rx request 

## 2019-11-17 ENCOUNTER — Encounter: Payer: Self-pay | Admitting: Family Medicine

## 2019-11-19 ENCOUNTER — Encounter: Payer: Self-pay | Admitting: Family Medicine

## 2019-11-20 MED ORDER — CLONAZEPAM 2 MG PO TABS: 2.0000 mg | ORAL_TABLET | Freq: Two times a day (BID) | ORAL | 1 refills | Status: DC

## 2019-11-25 ENCOUNTER — Encounter: Payer: Self-pay | Admitting: Family Medicine

## 2019-12-02 ENCOUNTER — Encounter: Payer: Self-pay | Admitting: Family Medicine

## 2019-12-02 ENCOUNTER — Other Ambulatory Visit: Payer: Self-pay

## 2019-12-02 NOTE — Telephone Encounter (Signed)
Pt has an appt tomorrow 12/03/2019 with you.

## 2019-12-03 ENCOUNTER — Ambulatory Visit (INDEPENDENT_AMBULATORY_CARE_PROVIDER_SITE_OTHER): Payer: Self-pay | Admitting: Family Medicine

## 2019-12-03 ENCOUNTER — Encounter: Payer: Self-pay | Admitting: Family Medicine

## 2019-12-03 DIAGNOSIS — F321 Major depressive disorder, single episode, moderate: Secondary | ICD-10-CM

## 2019-12-03 MED ORDER — BUPROPION HCL ER (XL) 150 MG PO TB24: 150.0000 mg | ORAL_TABLET | Freq: Every day | ORAL | 5 refills | Status: DC

## 2019-12-03 MED ORDER — MELOXICAM 15 MG PO TABS: 15.0000 mg | ORAL_TABLET | Freq: Every day | ORAL | 0 refills | Status: DC

## 2019-12-03 MED ORDER — VENLAFAXINE HCL ER 150 MG PO CP24: ORAL_CAPSULE | ORAL | 0 refills | Status: DC

## 2019-12-03 MED ORDER — HYDROCODONE-ACETAMINOPHEN 10-325 MG PO TABS: 1.0000 | ORAL_TABLET | Freq: Three times a day (TID) | ORAL | 0 refills | Status: AC | PRN

## 2019-12-03 NOTE — Progress Notes (Signed)
   Kile Kabler III is a 29 y.o. male who presents today for an office visit.  Assessment/Plan:  New/Acute Problems: Wrist Pain / Knee Pain Wrist pain is likely flareup of carpal tunnel.  Will start meloxicam 15 mg daily for the next few weeks.  If no improvement will need referral to sports medicine. Unclear etiology for knee pain.  No red flags and relatively normal exam.  Could have some component of patellofemoral syndrome.  Will have some improvement with meloxicam as noted above.  Unlikely that this represents rheumatoid arthritis given lack of effusion or erythema however patient does have a family history of this.  We will hold off on testing for the time being due to cost constraints.  We will send in a few hydrocodone as well for severe pain.  We will give work note for today.  Discussed reasons return to care.  He will check in with me in a couple of weeks.  Chronic Problems Addressed Today: Depression, major, single episode, moderate (HCC) Worsening due to decline in his mother's health.  Discussed options for tweaking his treatment at this point.  He would like to continue Zyprexa 5 mg daily as higher doses seem to make him more somnolent.  We will continue Effexor 225 mg daily and Remeron 30 mg at bedtime.  We will add on Wellbutrin 150 mg daily.  He will follow-up with me in a couple weeks.  He also has upcoming appointment with therapist in a few weeks as well.     Subjective:  HPI:  Patient is here with multiple sites of pain.  Pain started about 4 days ago and left knee.  Had sudden onset of severe pain.  This then progressed to involve his right knee.  Now predominantly has pain in his right hand though has pain in bilateral upper extremities as well.  He has tried taking diclofenac and Tylenol with codeine with no improvement.  No swelling.  No obvious injuries or other precipitating events.  His mother has been diagnosed with rheumatoid arthritis he is concerned about possible  autoimmune disease for himself.  Pain seems to be worsening.  Patient is also having worsening depression due to his mother's health declining.  He has a upcoming appointment with his therapist he has also been referred to psychiatry.       Objective:  Physical Exam: BP (!) 168/92   Pulse 100   Temp (!) 97.2 F (36.2 C)   Ht 6' (1.829 m)   Wt (!) 339 lb 3.2 oz (153.9 kg)   SpO2 99%   BMI 46.00 kg/m   Gen: No acute distress, resting comfortably CV: Regular rate and rhythm with no murmurs appreciated Pulm: Normal work of breathing, clear to auscultation bilaterally with no crackles, wheezes, or rhonchi MSK: Bilateral upper extremities with no deformities.  Pain with palpation and bilateral wrists with right greater than left.  Positive Tinel's sign bilaterally.  Left knee and right knee with full range of motion.  No crepitus or other abnormalities noted.  Neurovascular intact distally. Neuro: Grossly normal, moves all extremities Psych: Normal affect and thought content      Deshanna Kama M. [name], MD 12/03/2019 2:17 PM

## 2019-12-03 NOTE — Assessment & Plan Note (Signed)
Worsening due to decline in his mother's health.  Discussed options for tweaking his treatment at this point.  He would like to continue Zyprexa 5 mg daily as higher doses seem to make him more somnolent.  We will continue Effexor 225 mg daily and Remeron 30 mg at bedtime.  We will add on Wellbutrin 150 mg daily.  He will follow-up with me in a couple weeks.  He also has upcoming appointment with therapist in a few weeks as well.

## 2019-12-03 NOTE — Patient Instructions (Signed)
It was very nice to see you today!  Please start the meloxicam.  I will also send in a few hydrocodone for severe pain.  We will also start Wellbutrin to help with your depression.  No other changes today.  Please check in with me in a couple weeks to let me know how you are doing.  Take care, Dr [name]  Please try these tips to maintain a healthy lifestyle:   Eat at least 3 REAL meals and 1-2 snacks per day.  Aim for no more than 5 hours between eating.  If you eat breakfast, please do so within one hour of getting up.    Each meal should contain half fruits/vegetables, one quarter protein, and one quarter carbs (no bigger than a computer mouse)   Cut down on sweet beverages. This includes juice, soda, and sweet tea.     Drink at least 1 glass of water with each meal and aim for at least 8 glasses per day   Exercise at least 150 minutes every week.

## 2019-12-07 ENCOUNTER — Encounter: Payer: Self-pay | Admitting: Family Medicine

## 2019-12-08 ENCOUNTER — Other Ambulatory Visit: Payer: Self-pay

## 2019-12-08 DIAGNOSIS — M79645 Pain in left finger(s): Secondary | ICD-10-CM

## 2019-12-08 NOTE — Telephone Encounter (Signed)
FYI referral placed

## 2019-12-08 NOTE — Telephone Encounter (Signed)
Please Advise

## 2019-12-17 ENCOUNTER — Ambulatory Visit (INDEPENDENT_AMBULATORY_CARE_PROVIDER_SITE_OTHER): Payer: Self-pay | Admitting: Family Medicine

## 2019-12-17 DIAGNOSIS — F321 Major depressive disorder, single episode, moderate: Secondary | ICD-10-CM

## 2019-12-17 DIAGNOSIS — F411 Generalized anxiety disorder: Secondary | ICD-10-CM

## 2019-12-17 MED ORDER — ONDANSETRON 8 MG PO TBDP: 8.0000 mg | ORAL_TABLET | Freq: Three times a day (TID) | ORAL | 0 refills | Status: DC | PRN

## 2019-12-17 NOTE — Assessment & Plan Note (Signed)
Symptoms are improving but he is having side effects with Wellbutrin 150 mg daily.  We will start Zofran for the next couple of weeks as his body adjusts.  If continues to have issues will try splitting dosing to 75 mg twice daily.  We will continue Zyprexa 5 mg daily, Effexor 225 mg daily, and Remeron 30 mg at bedtime.  He will follow with me in a couple weeks via MyChart.

## 2019-12-17 NOTE — Progress Notes (Signed)
   Omar Krause is a 29 y.o. male who presents today for a virtual office visit.  Assessment/Plan:  Chronic Problems Addressed Today: Depression, major, single episode, moderate (HCC) Symptoms are improving but he is having side effects with Wellbutrin 150 mg daily.  We will start Zofran for the next couple of weeks as his body adjusts.  If continues to have issues will try splitting dosing to 75 mg twice daily.  We will continue Zyprexa 5 mg daily, Effexor 225 mg daily, and Remeron 30 mg at bedtime.  He will follow with me in a couple weeks via MyChart.    Subjective:  HPI:  Patient seen about 2 weeks ago for worsening anxiety depression.  He started on Wellbutrin 150 mg daily.  This is worked well to help control his depression however has had some nausea and vomiting with it.  He has had a few episodes of vomiting.  Episode of vomiting this morning.  He would like to continue the medication because he thinks it is working well.      Objective/Observations  Physical Exam: Gen: NAD, resting comfortably Pulm: Normal work of breathing Neuro: Grossly normal, moves all extremities Psych: Normal affect and thought content  Virtual Visit via Video   I connected with Omar Krause on 12/17/19 at  4:00 PM EST by a video enabled telemedicine application and verified that I am speaking with the correct person using two identifiers. The limitations of evaluation and management by telemedicine and the availability of in person appointments were discussed. The patient expressed understanding and agreed to proceed.   Patient location: Home Provider location: [place] [employer] Persons participating in the virtual visit: Myself and Patient     [name]. [name], MD 12/17/2019 4:17 PM

## 2019-12-29 ENCOUNTER — Ambulatory Visit: Payer: Self-pay | Admitting: Psychology

## 2019-12-30 ENCOUNTER — Encounter: Payer: Self-pay | Admitting: Family Medicine

## 2020-01-05 ENCOUNTER — Other Ambulatory Visit: Payer: Self-pay

## 2020-01-05 ENCOUNTER — Ambulatory Visit (INDEPENDENT_AMBULATORY_CARE_PROVIDER_SITE_OTHER): Payer: Self-pay | Admitting: Family Medicine

## 2020-01-05 VITALS — Ht 72.0 in

## 2020-01-05 DIAGNOSIS — G5600 Carpal tunnel syndrome, unspecified upper limb: Secondary | ICD-10-CM

## 2020-01-05 DIAGNOSIS — G5601 Carpal tunnel syndrome, right upper limb: Secondary | ICD-10-CM

## 2020-01-05 MED ORDER — ACETAMINOPHEN-CODEINE #3 300-30 MG PO TABS: 1.0000 | ORAL_TABLET | ORAL | 0 refills | Status: DC | PRN

## 2020-01-05 MED ORDER — BUPROPION HCL ER (XL) 150 MG PO TB24: 300.0000 mg | ORAL_TABLET | Freq: Every day | ORAL | 0 refills | Status: DC

## 2020-01-05 MED ORDER — PREDNISONE 50 MG PO TABS: ORAL_TABLET | ORAL | 0 refills | Status: DC

## 2020-01-05 NOTE — Assessment & Plan Note (Signed)
Worsened.  Has tried Mobic with modest improvement.  Will start course of prednisone today.  Discussed potential side effects.  Also check on referral to sports medicine.  Due to severe mouth pain, will also send in a small amount of Tylenol 3.

## 2020-01-05 NOTE — Progress Notes (Signed)
   Omar Krause is a 29 y.o. male who presents today for a virtual office visit.  Assessment/Plan:  Chronic Problems Addressed Today: Carpal tunnel syndrome Worsened.  Has tried Mobic with modest improvement.  Will start course of prednisone today.  Discussed potential side effects.  Also check on referral to sports medicine.  Due to severe mouth pain, will also send in a small amount of Tylenol 3.     Subjective:  HPI:  Patient has been having flareup of his carpal tunnel syndrome.  He was using meloxicam which helps modestly.  Last symptoms about a month ago.  He was referred to sports medicine but has not heard anything back yet.         Objective/Observations  Physical Exam: Gen: NAD, resting comfortably Pulm: Normal work of breathing Neuro: Grossly normal, moves all extremities Psych: Normal affect and thought content  Virtual Visit via Video   I connected with Omar Krause on 01/05/20 at  1:00 PM EDT by a video enabled telemedicine application and verified that I am speaking with the correct person using two identifiers. The limitations of evaluation and management by telemedicine and the availability of in person appointments were discussed. The patient expressed understanding and agreed to proceed.   Patient location: Home Provider location: [place] [facility] [place] [employer] Persons participating in the virtual visit: Myself and Patient     [name]. [name], MD 01/05/2020 1:21 PM

## 2020-01-06 ENCOUNTER — Encounter: Payer: Self-pay | Admitting: Family Medicine

## 2020-01-06 ENCOUNTER — Telehealth: Payer: Self-pay | Admitting: Family Medicine

## 2020-01-06 ENCOUNTER — Other Ambulatory Visit: Payer: Self-pay

## 2020-01-06 MED ORDER — RAMELTEON 8 MG PO TABS: 8.0000 mg | ORAL_TABLET | Freq: Every day | ORAL | 1 refills | Status: DC

## 2020-01-06 NOTE — Progress Notes (Signed)
ramelto

## 2020-01-06 NOTE — Telephone Encounter (Signed)
MEDICATION: Bupropion 300 MG tablet  PHARMACY: [facility] [phone] [address]  Comments:   **Let patient know to contact pharmacy at the end of the day to make sure medication is ready. **  ** Please notify patient to allow 48-72 hours to process**  **Encourage patient to contact the pharmacy for refills or they can request refills through [facility]**

## 2020-01-07 NOTE — Telephone Encounter (Signed)
[facility]  Advanced Heart Failure and Transplant  Heart Transplant Clinic  Follow-up Visit    Primary Care Physician: Brodsky, Mark E  Referring Provider: Brett Justin Berman  Date of Transplant: 12/11/2019  Organ(s) Transplanted: heart  Indication for transplant: Dilated Myopathy: Idiopathic  PHS increased risk donor: Yes    ID. 29 year old male with end-stage HFrEF 2/2 NICM s/p OHT 12/11/19, history of 2R, HTN, HLD and anxiety coming in for f/u of heart transplant.    Interval History:    The patient was last seen on 02/05/22. At that time issues with pain after urologic procedure.    He continues to deal with pain issue largely from prostate surgery. He still has some bleeding and some tissue come out. He tried different strategies and nothing helped. This is really impacting quality of life. He gets tired and frustrated and does not want to take it out on his family.    ROS:  A complete ROS was performed and is negative except as documented in the HPI.      Allergies:  Patient is allergic to cats [other] and dogs [other].    Past Medical History:   Diagnosis Date    Asthma     Atrial fibrillation (CMS-HCC)     Chronic HFrEF (heart failure with reduced ejection fraction) (CMS-HCC)     GERD (gastroesophageal reflux disease)     HTN (hypertension)     Insomnia     Nephrolithiasis     Sinusitis      Patient Active Problem List   Diagnosis    COPD (chronic obstructive pulmonary disease) (CMS-HCC)    Heart transplant, orthotopic, 12/11/2019    Pericardial effusion    Hypertension    Chronic back pain    At risk for infection transmitted from donor    Acute hepatitis C virus infection    Heart transplanted (CMS-HCC)    Acute UTI    Umbilical hernia without obstruction and without gangrene    COVID-19 virus detected    Acute medial meniscus tear of left knee, sequela    Localized osteoarthritis of left knee     Past Surgical History:   Procedure Laterality Date    CARDIAC DEFIBRILLATOR PLACEMENT       PB ANESTH,SHOULDER JOINT,NOS Right      Family History   Problem Relation Name Age of Onset    Hypertension Other      Other Maternal Grandmother          kidney disease needing HD     Social History     Socioeconomic History    Marital status: Single     Spouse name: Not on file    Number of children: Not on file    Years of education: Not on file    Highest education level: Not on file   Occupational History    Not on file   Tobacco Use    Smoking status: Never    Smokeless tobacco: Never    Tobacco comments:     from friends and relatives    Substance and Sexual Activity    Alcohol use: Not Currently     Comment: Prior heavier use, but completely quit in 2016    Drug use: Yes     Comment: eats edible marijuana for pain and insomnia     Sexual activity: Not on file   Other Topics Concern    Not on file   Social History Narrative      Born in [place], also lived in [place], [place], [place], no travel, worked as a carpenter, occasional cedar, no birds, no hot tubs, worked in construction + possible asbestos exposure      Social Determinants of Health     Financial Resource Strain: Not on file   Food Insecurity: Not on file   Transportation Needs: Not on file   Physical Activity: Not on file   Stress: Not on file   Social Connections: Not on file   Intimate Partner Violence: Not on file   Housing Stability: Not on file     Current Outpatient Medications   Medication Sig    albuterol 108 (90 Base) MCG/ACT inhaler Inhale 2 puffs by mouth every 4 hours as needed for Wheezing or Shortness of Breath.    aspirin 81 MG EC tablet Take 1 tablet (81 mg) by mouth daily.    baclofen (LIORESAL) 10 MG tablet Take 2 tablets (20 mg) by mouth nightly.    Blood Glucose Monitoring Suppl (TRUE METRIX METER) w/Device KIT Use as directed    budesonide-formoterol (SYMBICORT) 160-4.5 MCG/ACT inhaler Inhale 2 puffs by mouth every 12 hours.    bumetanide (BUMEX) 1 MG tablet Take 1 tablet (1 mg) by mouth daily as needed (fluid/weight  gain). Do not take unless instructed by Transplant team.    Calcium Carb-Cholecalciferol 600-10 MG-MCG TABS Take 1 tablet by mouth 2 times daily.    Cetirizine HCl (ZERVIATE) 0.24 % SOLN Place 1 drop into both eyes 2 times daily.    clindamycin (CLEOCIN T) 1 % solution Apply 1 Application. topically 2 times daily. Apply to the red bumps on your face up to two times a day.    controlled substance agreement controlled substance agreement    diclofenac (VOLTAREN) 1 % gel Apply 2 g topically 4 times daily.    docusate sodium (COLACE) 100 MG capsule Take 1 capsule (100 mg) by mouth 2 times daily.    DULoxetine (CYMBALTA) 30 MG CR capsule Take 1 capsule (30 mg) by mouth daily.    famotidine (PEPCID) 20 MG tablet Take 1 tablet (20 mg) by mouth 2 times daily.    fluticasone propionate (FLONASE) 50 MCG/ACT nasal spray Spray 1 spray into each nostril 2 times daily.    gabapentin (NEURONTIN) 300 MG capsule Take 1 capsule (300 mg) by mouth every morning AND 1 capsule (300 mg) daily AND 2 capsules (600 mg) every evening.    hydroCHLOROthiazide (HYDRODIURIL) 25 MG tablet Take 1 tablet (25 mg) by mouth daily.    ketoconazole (NIZORAL) 2 % shampoo Use shampoo daily for dandruff    lidocaine (LIDOCAINE PAIN RELIEF) 4 % patch Apply 1 patch topically every 24 hours. Leave patch on for 12 hours, then remove for 12 hours.    lisinopril (PRINIVIL, ZESTRIL) 10 MG tablet Take 2 tablets (20 mg) by mouth daily.    magnesium oxide (MAG-OX) 400 MG tablet Take 1 tablet by mouth daily    melatonin (GNP MELATONIN MAXIMUM STRENGTH) 5 MG tablet Take 2 tablets (10 mg) by mouth at bedtime.    Multiple Vitamin (MULTIVITAMIN) TABS tablet Take 1 tablet by mouth daily.    naloxone (KLOXXADO) 8 mg/0.1 mL nasal spray Call 911! Tilt head and spray intranasally into one nostril as needed for respiratory depression. If patient does not respond or responds and then relapses, repeat using a new nasal spray every 3 minutes until emergency medical assistance  arrives.    NEEDLE, DISP, 25 G 25G X   1" MISC Use to inject testosterone    NIFEdipine (ADALAT CC) 30 MG Controlled-Release tablet Take 1 tablet (30 mg) by mouth nightly.    ondansetron (ZOFRAN) 8 MG tablet Take 1 tablet (8 mg) by mouth every 8 hours as needed for Nausea/Vomiting.    oxyCODONE (ROXICODONE) 10 MG tablet Take 1 tab every 4 hours as needed for moderate pain and 2 tabs every 4 hours as needed for severe pain. Max 10 tabs per day, 28 day supply    phenazopyridine (PYRIDIUM) 100 MG tablet Take 1 tablet (100 mg) by mouth 3 times daily.    polyethylene glycol (GLYCOLAX) 17 GM/SCOOP powder Mix 17 grams in 4-8 oz of liquide and drink by mouth daily as needed (Constipation).    pravastatin (PRAVACHOL) 40 MG tablet Take 1 tablet (40 mg) by mouth every evening.    senna (SENOKOT) 8.6 MG tablet Take 1 tablet (8.6 mg) by mouth daily.    sirolimus (RAPAMUNE) 1 MG tablet Take 2 tablets (2 mg) by mouth every morning.    SYRINGE-NEEDLE, DISP, 3 ML (B-D 3CC LUER-LOK SYR 25GX1") 25G X 1" 3 ML MISC Use as directed to inject testosterone    SYRINGE-NEEDLE, DISP, 3 ML 18G X 1-1/2" 3 ML MISC Use to draw up testosterone    tacrolimus (ENVARSUS XR) 1 MG tablet STOP TAKING since 07/04/22 - remaining on chart for dose adjustments, titratable med.    tacrolimus (ENVARSUS XR) 4 MG tablet Take 1 tablet (4 mg) by mouth every morning.    tamsulosin (FLOMAX) 0.4 MG capsule Take 1 capsule (0.4 mg) by mouth daily.    tamsulosin (FLOMAX) 0.4 MG capsule Take 1 capsule (0.4 mg) by mouth daily.    testosterone cypionate (DEPO-TESTOSTERONE) 200 MG/ML SOLN Inject 1 ml into the muscle every 14 days    traZODone (DESYREL) 50 MG tablet Take 1 tablet (50 mg) by mouth nightly.     Current Facility-Administered Medications   Medication    diphenhydrAMINE (BENADRYL) injection 50 mg    diphenhydrAMINE (BENADRYL) tablet 50 mg     Immunization History   Administered Date(s) Administered    COVID-19 (Moderna) Low Dose Red Cap >= 18 Years 12/02/2020     COVID-19 (Moderna) Red Cap >= 12 Years 01/09/2020, 02/08/2020, 06/08/2020    Hep-A/Hep-B; Twinrix, Adult 01/02/2021    Influenza Vaccine (High Dose) Quadrivalent >=65 Years 08/24/2020    Influenza Vaccine (Unspecified) 06/22/2017    Influenza Vaccine >=6 Months 09/04/2010, 09/04/2011, 10/30/2012, 07/28/2014, 07/11/2018, 07/27/2019    Pneumococcal 13 Vaccine (PREVNAR-13) 08/24/2020    Pneumococcal 23 Vaccine (PNEUMOVAX-23) 09/04/2013, 01/02/2021    Tdap 10/23/2011   Deferred Date(s) Deferred    Pneumococcal 23 Vaccine (PNEUMOVAX-23) 12/24/2019     Physical Exam:  BP 102/69 (BP Location: Right arm, BP Patient Position: Sitting, BP cuff size: Large)   Pulse 98   Temp 98.5 F (36.9 C) (Temporal)   Resp 16   Ht 5' 10" (1.778 m)   Wt 96.2 kg (212 lb)   SpO2 97%   BMI 30.42 kg/m      General Appearance: ***alert, no distress, pleasant affect, cooperative.  Heart:  JVD ***, PMI ***, normal rate and regular rhythm, no murmurs, clicks, or gallops. ***  Lungs: ***clear to auscultation and percussion. No rales, rhonchi, or wheezes noted. No chest deformities noted.  Abdomen: ***BS normal.  Abdomen soft, non-tender.  No masses or organomegaly.  Extremities:  ***no cyanosis, clubbing, or edema. Has 2+ peripheral pulses.        Lab Data:  Lab Results   Component Value Date    BUN 26 (H) 07/04/2022    CREAT 1.98 (H) 07/04/2022    CL 99 07/04/2022    NA 140 07/04/2022    K 4.4 07/04/2022    CA 9.2 07/04/2022    TBILI 0.47 07/04/2022    ALB 4.1 07/04/2022    TP 7.1 07/04/2022    AST 22 07/04/2022    ALK 76 07/04/2022    BICARB 29 07/04/2022    ALT 25 07/04/2022    GLU 126 (H) 07/04/2022     Lab Results   Component Value Date    WBC 7.9 07/04/2022    RBC 5.50 07/04/2022    HGB 15.2 07/04/2022    HCT 46.5 07/04/2022    MCV 84.5 07/04/2022    MCHC 32.7 07/04/2022    RDW 12.3 07/04/2022    PLT 162 07/04/2022    MPV 11.6 07/04/2022     Lab Results   Component Value Date    A1C 5.7 12/01/2021     Lab Results   Component Value Date     TSH 1.63 12/01/2021     Lab Results   Component Value Date    CHOL 105 12/01/2021    HDL 38 12/01/2021    LDLCALC 43 12/01/2021    TRIG 121 12/01/2021     Lab Results   Component Value Date    SIROT 11.5 07/04/2022     Lab Results   Component Value Date    FKTR 6.5 07/04/2022     No results found for: CSATR  Lab Results   Component Value Date    CMVPL Not Detected 09/22/2021     Lab Results   Component Value Date    DSA ABSENT 07/04/2022       Prior Cardiovascular Studies:   Lab Results   Component Value Date    LV Ejection Fraction 59 12/28/2021          Echo 12/28/21  Summary:   1. The left ventricular size is normal. The left ventricular systolic function is normal.   2. No left ventricular hypertrophy.   3. Normal pattern of left ventricular diastolic filling.   4. EF=59%.   5. Compared to prior study EF now 59%, was 69% 01/20/21.     LHC/IVUS 12/26/21  CONCLUSION:                                                                   1. Myocardial bridging with mild systolic compression of the mid segment    of the left anterior descending coronary artery.                              2. No angiographic evidence of coronary artery disease.                      3. Intimal thickness noted in LAD/LM up to 0.5 mm (Stable to slightly       worse compare to 2022).                                                         4. Non significant FFR at apical LAD.                                        5. Left ventricular end diastolic pressure appears normal.        Assessment summary:  29 year old male with end-stage HFrEF 2/2 NICM s/p OHT 12/11/19, history of 2R, HTN, HLD and anxiety coming in for f/u of heart transplant.    Assessment/Plan:  # Hematuria  # Dysuria  # Chronic pain  Assessment: We had a long frank discussion about patient's chronic pain issues and the heart transplant team's role in this. I discussed with him that when I initially agreed to cover his chronic opiate prescription, this was the assumption that he would  have a provider versed in chronic pain after 3-4 months, but we are at 6 months and has unable to find one. Additionally, I had not put him on a pain contract at that time, but he recently used more opiates without asking and I informed him this was not appropriate, but because he had not established guidelines I was not going to stop at this time. However, going forward until he can establish with a pain physician, we will set up a pain contract and he will need to follow through like a usual pain clinic with us with goal of provider in 3-4 months or I may start tapering. I will augment adjuvant agents additionally for now and we can continue to work on this.  Plan:  -pain contract signed  -urine tox monthly  -clinic follow up month  -oxycodone 10 mg tablets PO, 1 tab every 4 hours moderate pain, 2 tabs every 4 hours for severe pain, no more than 10 tablets a day, total 280 per 28 days.   -diclofenac cream for joint pain  -lidocaine patch for back pain  -trial of pyridium  -increase gaba at night  -siro change as below  -cymbalta as below    # End-stage heart failure s/p orthotopic heart transplant  # Chronic Immunosuppression/Immunomodulation  Assessment: While we thought continuing sirolimus would help prevent recurrent scar tissue from prostate procedure, it may be exacerbating factors now with delayed wound healing. Will try mmf for 1 month.  Plan:   - continue envarsus 6 mg daily, goal trough 4-8  - HOLD sirolimus 3 mg daily, goal trough 4-8 for at least 1 month  - start mmf 1000 mg bid for one month to allow healing  - Continue to monitor for renal toxicities, infection risk and malignancy risk  - continue pravastatin 40 mg daily  - continue aspirin 81 mg daily    # Hypertension  Assessment: controlled  Plan:  -continue lisinopril 20 mg daily  -resume hctz  -nifedipine 30 mg daily    # Dyslipidemia  -continue pravastatin 40 mg daily    # Depression  Assessment: improved mood  Plan:  -increase cymbalta to 120  mg daily     RTC in 1 month       Nicholas W Wettersten, MD  Advanced Heart Failure, Mechanical Circulatory Support, Transplant  Pgr: [phone]

## 2020-01-07 NOTE — Telephone Encounter (Signed)
Ok with me. Please place any necessary orders. 

## 2020-01-07 NOTE — Telephone Encounter (Signed)
Rx request to 300 MG tabs

## 2020-01-13 ENCOUNTER — Other Ambulatory Visit: Payer: Self-pay | Admitting: Family Medicine

## 2020-01-14 ENCOUNTER — Other Ambulatory Visit: Payer: Self-pay | Admitting: Family Medicine

## 2020-01-14 ENCOUNTER — Ambulatory Visit: Payer: Federal, State, Local not specified - Other | Admitting: Psychology

## 2020-01-15 NOTE — Telephone Encounter (Signed)
LAST APPOINTMENT DATE: 01/05/2020  NEXT APPOINTMENT DATE:@Visit  date not found  Rx Klonopin 2mg  LAST REFILL:11/20/2019  QTY:#60 Rf1

## 2020-01-17 ENCOUNTER — Encounter (HOSPITAL_COMMUNITY): Payer: Self-pay | Admitting: Emergency Medicine

## 2020-01-17 ENCOUNTER — Emergency Department (HOSPITAL_COMMUNITY)
Admission: EM | Admit: 2020-01-17 | Discharge: 2020-01-17 | Disposition: A | Discharge status: Home / Self Care | Payer: Self-pay | Attending: Emergency Medicine | Admitting: Emergency Medicine

## 2020-01-17 ENCOUNTER — Other Ambulatory Visit: Payer: Self-pay

## 2020-01-17 DIAGNOSIS — R519 Headache, unspecified: Secondary | ICD-10-CM | POA: Insufficient documentation

## 2020-01-17 DIAGNOSIS — Z5321 Procedure and treatment not carried out due to patient leaving prior to being seen by health care provider: Secondary | ICD-10-CM | POA: Insufficient documentation
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
## 2020-01-26 ENCOUNTER — Other Ambulatory Visit: Payer: Self-pay | Admitting: Family Medicine

## 2020-01-26 ENCOUNTER — Telehealth (INDEPENDENT_AMBULATORY_CARE_PROVIDER_SITE_OTHER): Payer: Medicaid Other | Admitting: Family Medicine

## 2020-01-26 DIAGNOSIS — R300 Dysuria: Secondary | ICD-10-CM

## 2020-01-26 DIAGNOSIS — F411 Generalized anxiety disorder: Secondary | ICD-10-CM

## 2020-01-26 MED ORDER — CHLORDIAZEPOXIDE HCL 10 MG PO CAPS: 10.0000 mg | ORAL_CAPSULE | Freq: Three times a day (TID) | ORAL | 5 refills | Status: DC | PRN

## 2020-01-26 MED ORDER — NITROFURANTOIN MONOHYD MACRO 100 MG PO CAPS: 100.0000 mg | ORAL_CAPSULE | Freq: Two times a day (BID) | ORAL | 0 refills | Status: DC

## 2020-01-26 NOTE — Telephone Encounter (Signed)
LAST APPOINTMENT DATE: 01/05/2020 NEXT APPOINTMENT DATE:@4 /03/2020  Rx Tylenol #3 LAST REFILL:01/05/2020 QTY: 30  Rx Zofran  LAST REFILL:12/17/2019 QTY: 30  Rx Effexor  LAST REFILL:12/03/2019 QTY: 30

## 2020-01-26 NOTE — Progress Notes (Signed)
   Omar Krause is a 29 y.o. male who presents today for a virtual office visit.  Assessment/Plan:  New/Acute Problems: Dysuria No red flags.  Concerning for UTI.  Will empirically start Macrobid.  Patient will come in to drop off a urine sample for UA and urine culture.  Chronic Problems Addressed Today: Generalized anxiety disorder Worsened due to recent stress of mother being diagnosed with stage Krause lung cancer.  We will continue current regimen of Effexor to 25 mg daily, gabapentin 300 mg 3 times daily, Remeron 30 mg daily, and Zyprexa 10 mg daily.  We will switch from Klonopin to Librium 10 mg 3 times daily as needed as this is worked well for him.  We will also place referral to psychiatry per request.     Subjective:  HPI:  Patient has noticed burning with urination for the past several days.  Seems to be worsening.  Feels like a UTI.  Is never had a UTI in the past.  No fevers or chills.  No reported nausea or vomiting.  Has had some back pain at his baseline.  He has also been under quite a bit of stress recently.  His mother was recently diagnosed with stage Krause lung cancer.  He is on Effexor to 25 mg daily, gabapentin 300 mg 3 times daily, olanzapine 10 mg nightly and Remeron 30 mg nightly.       Objective/Observations  Physical Exam: Gen: NAD, resting comfortably Pulm: Normal work of breathing Neuro: Grossly normal, moves all extremities Psych: Normal affect and thought content  Virtual Visit via Video   I connected with Omar Krause on 01/26/20 at 11:20 AM EDT by a video enabled telemedicine application and verified that I am speaking with the correct person using two identifiers. The limitations of evaluation and management by telemedicine and the availability of in person appointments were discussed. The patient expressed understanding and agreed to proceed.   Patient location: Home Provider location: [place] [facility] [employer] Persons participating in  the virtual visit: Myself and Patient     [name]. [name], MD 01/26/2020 11:49 AM

## 2020-01-26 NOTE — Assessment & Plan Note (Signed)
Worsened due to recent stress of mother being diagnosed with stage III lung cancer.  We will continue current regimen of Effexor to 25 mg daily, gabapentin 300 mg 3 times daily, Remeron 30 mg daily, and Zyprexa 10 mg daily.  We will switch from Klonopin to Librium 10 mg 3 times daily as needed as this is worked well for him.  We will also place referral to psychiatry per request.

## 2020-01-27 ENCOUNTER — Telehealth: Payer: Self-pay | Admitting: Family Medicine

## 2020-01-27 NOTE — Telephone Encounter (Signed)
Patient called in this afternoon wanting to speak with the nurse about possibly going up on the dosage for the chlordiazePOXIDE (LIBRIUM) 10 MG capsule

## 2020-01-28 ENCOUNTER — Ambulatory Visit: Payer: Federal, State, Local not specified - Other | Admitting: Psychology

## 2020-01-28 NOTE — Telephone Encounter (Signed)
Please advise 

## 2020-01-28 NOTE — Telephone Encounter (Signed)
Attempted to call patient,not able to leave voice message voicemail full.

## 2020-01-28 NOTE — Telephone Encounter (Signed)
Patient requesting 25mg  dosage

## 2020-01-28 NOTE — Telephone Encounter (Signed)
Please call and get more information. We started him on dose he was on previously.  [name]. [name], MD 01/28/2020 11:57 AM

## 2020-01-28 NOTE — Telephone Encounter (Signed)
Recommend that he double the dose he is on currently and check back in with [place] in a week or so. Don't think his insurance will approve a new rx this soon.  [name]. [name], MD 01/28/2020 2:33 PM

## 2020-01-29 ENCOUNTER — Other Ambulatory Visit: Payer: Self-pay

## 2020-01-29 ENCOUNTER — Telehealth (INDEPENDENT_AMBULATORY_CARE_PROVIDER_SITE_OTHER): Payer: Medicaid Other | Admitting: Physician Assistant

## 2020-01-29 ENCOUNTER — Encounter: Payer: Self-pay | Admitting: Physician Assistant

## 2020-01-29 DIAGNOSIS — R112 Nausea with vomiting, unspecified: Secondary | ICD-10-CM

## 2020-01-29 MED ORDER — CIPROFLOXACIN HCL 500 MG PO TABS: 500.0000 mg | ORAL_TABLET | Freq: Two times a day (BID) | ORAL | 0 refills | Status: AC

## 2020-01-29 NOTE — Progress Notes (Signed)
Virtual Visit via Video   I connected with Omar Krause on 01/29/20 at  3:00 PM EDT by a video enabled telemedicine application and verified that I am speaking with the correct person using two identifiers. Location patient: Home Location provider: [facility], Office Persons participating in the virtual visit: Omar Krause, Omar Coke PA-C, Omar Pickler, LPN   I discussed the limitations of evaluation and management by telemedicine and the availability of in person appointments. The patient expressed understanding and agreed to proceed.  I acted as a Education administrator for [employer], PA-C [employer], LPN  Subjective:   HPI:   Vomiting Pt c/o nausea since Monday since he ran out of his Effexor. Pt started vomiting yesterday. Has vomited 4 times today and is taking Zofran for nausea with no relief. Pt needs a note for work.  He reports that he is eating and drinking okay, denies any concerns for dehydration.  He was seen virtually by his PCP on 01/26/2020 and complained of dysuria.  He was started on Macrobid and was asked to provide a urine sample at our office at a later date.  Patient reports that he came to the office on 01/27/2020 for urine sample, however unfortunately we are unable to locate this.  He states that he has had persistent dysuria since starting Macrobid.  He denies: Fever, chills, back pain, lightheadedness, dizziness.   ROS: See pertinent positives and negatives per HPI.  Patient Active Problem List   Diagnosis Date Noted  . PTSD (post-traumatic stress disorder) 07/16/2018  . Carpal tunnel syndrome 05/02/2018  . Depression, major, single episode, moderate ([place]) 03/31/2018  . Insomnia 03/12/2016  . Marijuana use 03/12/2016  . Asthma, mild intermittent 09/14/2015  . HTN (hypertension) 02/02/2014  . Generalized anxiety disorder 02/02/2014  . Morbid obesity ([place]) 07/08/2013    Social History   Tobacco Use  . Smoking status: Former Smoker    Packs/day:  0.50    Types: Cigarettes    Quit date: 01/18/2020    Years since quitting: 0.0  . Smokeless tobacco: Never Used  Substance Use Topics  . Alcohol use: No    Current Outpatient Medications:  .  acetaminophen-codeine (TYLENOL #3) 300-30 MG tablet, TAKE 1 TO 2 TABLETS BY MOUTH EVERY 4 HOURS AS NEEDED FOR MODERATE PAIN.  MAX OF 11 TABLETS PER DAY., Disp: 15 tablet, Rfl: 0 .  albuterol (VENTOLIN HFA) 108 (90 Base) MCG/ACT inhaler, Inhale 1-2 puffs into the lungs every 6 (six) hours as needed for wheezing or shortness of breath., Disp: 18 g, Rfl: 0 .  buPROPion (WELLBUTRIN XL) 150 MG 24 hr tablet, Take 2 tablets (300 mg total) by mouth daily., Disp: 30 tablet, Rfl: 0 .  chlordiazePOXIDE (LIBRIUM) 10 MG capsule, Take 1 capsule (10 mg total) by mouth 3 (three) times daily as needed for anxiety. (Patient taking differently: Take 10 mg by mouth 2 (two) times daily as needed for anxiety. ), Disp: 90 capsule, Rfl: 5 .  diclofenac (VOLTAREN) 75 MG EC tablet, Take 1 tablet by mouth twice daily, Disp: 60 tablet, Rfl: 0 .  gabapentin (NEURONTIN) 300 MG capsule, Take 1 capsule (300 mg total) by mouth 3 (three) times daily. (Patient taking differently: Take 300 mg by mouth 3 (three) times daily as needed. ), Disp: 90 capsule, Rfl: 3 .  losartan (COZAAR) 50 MG tablet, Take 1 tablet (50 mg total) by mouth daily., Disp: 90 tablet, Rfl: 3 .  mirtazapine (REMERON) 30 MG tablet, Take 1  tablet (30 mg total) by mouth at bedtime., Disp: 30 tablet, Rfl: 3 .  Multiple Vitamin (MULTIVITAMIN WITH MINERALS) TABS tablet, Take 1 tablet by mouth daily., Disp: , Rfl:  .  nitrofurantoin, macrocrystal-monohydrate, (MACROBID) 100 MG capsule, Take 1 capsule (100 mg total) by mouth 2 (two) times daily., Disp: 14 capsule, Rfl: 0 .  OLANZapine (ZYPREXA) 10 MG tablet, TAKE 1 TABLET BY MOUTH AT BEDTIME, Disp: 30 tablet, Rfl: 2 .  ondansetron (ZOFRAN ODT) 8 MG disintegrating tablet, Take 1 tablet (8 mg total) by mouth every 8 (eight) hours  as needed for nausea or vomiting., Disp: 30 tablet, Rfl: 0 .  ondansetron (ZOFRAN) 8 MG tablet, TAKE 1 TABLET BY MOUTH EVERY 8 HOURS AS NEEDED FOR NAUSEA FOR VOMITING, Disp: 30 tablet, Rfl: 0 .  pantoprazole (PROTONIX) 40 MG tablet, Take 1 tablet by mouth once daily, Disp: 90 tablet, Rfl: 0 .  propranolol ER (INDERAL LA) 120 MG 24 hr capsule, Take 1 capsule (120 mg total) by mouth daily., Disp: 90 capsule, Rfl: 3 .  ramelteon (ROZEREM) 8 MG tablet, Take 1 tablet (8 mg total) by mouth at bedtime., Disp: 30 tablet, Rfl: 1 .  venlafaxine XR (EFFEXOR-XR) 150 MG 24 hr capsule, TAKE 1 CAPSULE BY MOUTH ONCE DAILY WITH BREAKFAST, Disp: 30 capsule, Rfl: 0 .  ciprofloxacin (CIPRO) 500 MG tablet, Take 1 tablet (500 mg total) by mouth 2 (two) times daily for 10 days., Disp: 20 tablet, Rfl: 0  Allergies  Allergen Reactions  . Ceftin [Cefuroxime Axetil] Itching and Other (See Comments)    Dizziness as well  . Escitalopram Nausea And Vomiting  . Prednisone     Worsens anxiety, causes panic attacks and insomnia.     Objective:   VITALS: Per patient if applicable, see vitals. GENERAL: Alert, appears well and in no acute distress. HEENT: Atraumatic, conjunctiva clear, no obvious abnormalities on inspection of external nose and ears. NECK: Normal movements of the head and neck. CARDIOPULMONARY: No increased WOB. Speaking in clear sentences. I:E ratio WNL.  MS: Moves all visible extremities without noticeable abnormality. PSYCH: Pleasant and cooperative, well-groomed. Speech normal rate and rhythm. Affect is appropriate. Insight and judgement are appropriate. Attention is focused, linear, and appropriate.  NEURO: CN grossly intact. Oriented as arrived to appointment on time with no prompting. Moves both UE equally.  SKIN: No obvious lesions, wounds, erythema, or cyanosis noted on face or hands.  Assessment and Plan:   Omar Krause was seen today for vomiting.  Diagnoses and all orders for this  visit:  Non-intractable vomiting with nausea, unspecified vomiting type  Other orders -     ciprofloxacin (CIPRO) 500 MG tablet; Take 1 tablet (500 mg total) by mouth 2 (two) times daily for 10 days.   Suspect that this is likely from ongoing withdrawal from Effexor.  He was told by my nurse [facility] to purchase Effexor and he plans to pick this up today.  I think that this will help improve his symptoms.  I did discuss that unfortunately because we do not have a urine, and he has ongoing dysuria, now with nausea and vomiting, I cannot rule out worsening urinary tract infection, ?  Pyelonephritis.  We will stop Macrobid and start Cipro.  Recommended close follow-up with PCP if symptoms do not improve or if they change in any way.  Patient verbalized understanding to plan.  Work note provided, patient to return to work on 01/31/2020.  Marland Kitchen Reviewed expectations re: course of current  medical issues. . Discussed self-management of symptoms. . Outlined signs and symptoms indicating need for more acute intervention. . Patient verbalized understanding and all questions were answered. Marland Kitchen Health Maintenance issues including appropriate healthy diet, exercise, and smoking avoidance were discussed with patient. . See orders for this visit as documented in the electronic medical record.  I discussed the assessment and treatment plan with the patient. The patient was provided an opportunity to ask questions and all were answered. The patient agreed with the plan and demonstrated an understanding of the instructions.   The patient was advised to call back or seek an in-person evaluation if the symptoms worsen or if the condition fails to improve as anticipated.   CMA or LPN served as scribe during this visit. History, Physical, and Plan performed by medical provider. The above documentation has been reviewed and is accurate and complete.   [place], [place] 01/29/2020

## 2020-02-02 ENCOUNTER — Encounter: Payer: Self-pay | Admitting: Family Medicine

## 2020-02-02 ENCOUNTER — Other Ambulatory Visit: Payer: Self-pay | Admitting: Family Medicine

## 2020-02-03 NOTE — Telephone Encounter (Signed)
Please advise 

## 2020-02-17 ENCOUNTER — Telehealth: Payer: Self-pay | Admitting: Family Medicine

## 2020-02-17 NOTE — Telephone Encounter (Signed)
Patient stated the ramelteon is working,he will call if needed.

## 2020-02-17 NOTE — Telephone Encounter (Signed)
Please advise 

## 2020-02-17 NOTE — Telephone Encounter (Signed)
Patient is calling in this morning asking if Dr.Parker could prescribe him something to help him sleep, he states his mother just recently passed and is having a hard time.

## 2020-02-17 NOTE — Telephone Encounter (Signed)
I am so sorry to hear about the loss of his mother. Please make sure that he is taking the ramelteon. If he is an it isn't working we can switch to trazodone or belsomra. We can also try increasing his dose of zyprexa if needed.  [name]. [name], MD 02/17/2020 10:52 AM

## 2020-02-18 ENCOUNTER — Encounter: Payer: Self-pay | Admitting: Family Medicine

## 2020-02-18 ENCOUNTER — Telehealth (INDEPENDENT_AMBULATORY_CARE_PROVIDER_SITE_OTHER): Payer: Medicaid Other | Admitting: Family Medicine

## 2020-02-18 DIAGNOSIS — Z8659 Personal history of other mental and behavioral disorders: Secondary | ICD-10-CM

## 2020-02-18 DIAGNOSIS — F4321 Adjustment disorder with depressed mood: Secondary | ICD-10-CM

## 2020-02-18 DIAGNOSIS — Z72820 Sleep deprivation: Secondary | ICD-10-CM

## 2020-02-18 NOTE — Progress Notes (Signed)
Virtual Visit via Video Note  I connected with Omar Krause  on 02/18/20 at 12:00 PM EDT by a video enabled telemedicine application and verified that I am speaking with the correct person using two identifiers.  Location patient: home Location provider:work or home office Persons participating in the virtual visit: patient, provider  I discussed the limitations of evaluation and management by telemedicine and the availability of in person appointments. The patient expressed understanding and agreed to proceed.   HPI:  Acute telemedicine visit for sleep issues: -history of anxiety on many psych medications with PCP and per most recent PCP visit notes was referred to psychiatry -today reports issues with sleep -lost his mother 2 days ago when he went to take a nap and now when he sleeps he with dream that she is calling his name and will wake up -he reports he wants to get a message to Dr. [name] about his librium rx and is requesting new rx -he also wants to check on his psychiatry referral, reports he has reached out about this, but his calls were not returned -denies SI, thoughts of harm hallucination -is emotional, sad, grieving and has increased anxiety lately -meds: bupropion, gabapentin prn - not often, remeron nightly, zyprexa 10mg , ramelton 8mg , effexor 225mg , librium 20mg  tid, propranolol, losartan, protonix -he lost his mother recently and is grieving -no counselor currently   ROS: See pertinent positives and negatives per HPI.  Past Medical History:  Diagnosis Date  . Anxiety   . Asthma    No inhaler at home, has not had issues since he was child  . Depression   . Hypertension   . Morbid obesity (HCC)   . Nausea   . Palpitations   . Seizures (HCC)     Past Surgical History:  Procedure Laterality Date  . CIRCUMCISION    . TONSILLECTOMY AND ADENOIDECTOMY      Family History  Problem Relation Age of Onset  . Arthritis Mother   . Diabetes Mother   . Alcohol abuse  Mother   . Alcohol abuse Father   . Arthritis Father   . Hyperlipidemia Father   . Stroke Father   . Hypertension Father   . Mental illness Father   . Diabetes Father   . Bipolar disorder Father   . Heart attack Father   . Arthritis Maternal Grandmother   . Stroke Maternal Grandmother   . Diabetes Maternal Grandmother   . Arthritis Maternal Grandfather   . Heart attack Maternal Grandfather 50  . Diabetes Maternal Grandfather     SOCIAL HX: see hpi   Current Outpatient Medications:  .  acetaminophen-codeine (TYLENOL #3) 300-30 MG tablet, TAKE 1 TO 2 TABLETS BY MOUTH EVERY 4 HOURS AS NEEDED FOR MODERATE PAIN.  MAX OF 11 TABLETS PER DAY., Disp: 15 tablet, Rfl: 0 .  albuterol (VENTOLIN HFA) 108 (90 Base) MCG/ACT inhaler, Inhale 1-2 puffs into the lungs every 6 (six) hours as needed for wheezing or shortness of breath., Disp: 18 g, Rfl: 0 .  buPROPion (WELLBUTRIN XL) 150 MG 24 hr tablet, Take 2 tablets (300 mg total) by mouth daily., Disp: 30 tablet, Rfl: 0 .  chlordiazePOXIDE (LIBRIUM) 10 MG capsule, Take 1 capsule (10 mg total) by mouth 3 (three) times daily as needed for anxiety. (Patient taking differently: Take 10 mg by mouth 2 (two) times daily as needed for anxiety. ), Disp: 90 capsule, Rfl: 5 .  diclofenac (VOLTAREN) 75 MG EC tablet, Take 1 tablet by mouth twice  daily, Disp: 60 tablet, Rfl: 0 .  gabapentin (NEURONTIN) 300 MG capsule, Take 1 capsule (300 mg total) by mouth 3 (three) times daily. (Patient taking differently: Take 300 mg by mouth 3 (three) times daily as needed. ), Disp: 90 capsule, Rfl: 3 .  losartan (COZAAR) 50 MG tablet, Take 1 tablet (50 mg total) by mouth daily., Disp: 90 tablet, Rfl: 3 .  mirtazapine (REMERON) 30 MG tablet, TAKE 1 TABLET BY MOUTH AT BEDTIME, Disp: 30 tablet, Rfl: 0 .  Multiple Vitamin (MULTIVITAMIN WITH MINERALS) TABS tablet, Take 1 tablet by mouth daily., Disp: , Rfl:  .  nitrofurantoin, macrocrystal-monohydrate, (MACROBID) 100 MG capsule, Take  1 capsule (100 mg total) by mouth 2 (two) times daily., Disp: 14 capsule, Rfl: 0 .  OLANZapine (ZYPREXA) 10 MG tablet, TAKE 1 TABLET BY MOUTH AT BEDTIME, Disp: 30 tablet, Rfl: 2 .  ondansetron (ZOFRAN ODT) 8 MG disintegrating tablet, Take 1 tablet (8 mg total) by mouth every 8 (eight) hours as needed for nausea or vomiting., Disp: 30 tablet, Rfl: 0 .  ondansetron (ZOFRAN) 8 MG tablet, TAKE 1 TABLET BY MOUTH EVERY 8 HOURS AS NEEDED FOR NAUSEA FOR VOMITING, Disp: 30 tablet, Rfl: 0 .  pantoprazole (PROTONIX) 40 MG tablet, Take 1 tablet by mouth once daily, Disp: 90 tablet, Rfl: 0 .  propranolol ER (INDERAL LA) 120 MG 24 hr capsule, Take 1 capsule (120 mg total) by mouth daily., Disp: 90 capsule, Rfl: 3 .  ramelteon (ROZEREM) 8 MG tablet, Take 1 tablet (8 mg total) by mouth at bedtime., Disp: 30 tablet, Rfl: 1 .  venlafaxine XR (EFFEXOR-XR) 150 MG 24 hr capsule, TAKE 1 CAPSULE BY MOUTH ONCE DAILY WITH BREAKFAST, Disp: 30 capsule, Rfl: 0  EXAM:  VITALS per patient if applicable:  GENERAL: alert, oriented, appears well and in no acute distress  HEENT: atraumatic, conjunttiva clear, no obvious abnormalities on inspection of external nose and ears  NECK: normal movements of the head and neck  LUNGS: on inspection no signs of respiratory distress, breathing rate appears normal, no obvious gross SOB, gasping or wheezing  CV: no obvious cyanosis  MS: moves all visible extremities without noticeable abnormality  PSYCH/NEURO: pleasant and cooperative, tearful at times talking about his mother, speech and thought processing grossly intact  ASSESSMENT AND PLAN:  Discussed the following assessment and plan:  Grief  History of anxiety  Poor sleep  -we discussed possible serious and likely etiologies, options for evaluation and workup, limitations of telemedicine visit vs in person visit, treatment, treatment risks and precautions. Counseled and supported. Discussed common symptoms of grief and  resources to assist. Strongly encouraged him to seek out the counseling offered by the hospice center - he has this information as his mother was in hospice. Reached out to PCP about his referral and the medication request. Discussed emergency psychiatric care options and advised to seek emergency care if any thoughts of harm or severe symptoms. Advised PCP follow up while awaiting psychiatry care if any further concerns or questions. Talked with his PCP about his case, medication and referral by phone and he said he will be sending his rx and discussing with referral coordinator.   I discussed the assessment and treatment plan with the patient. The patient was provided an opportunity to ask questions and all were answered. The patient agreed with the plan and demonstrated an understanding of the instructions.   The patient was advised to call back or seek an in-person evaluation if the symptoms  worsen or if the condition fails to improve as anticipated.   Lucretia Kern, DO

## 2020-02-19 ENCOUNTER — Other Ambulatory Visit: Payer: Self-pay | Admitting: Family Medicine

## 2020-02-19 MED ORDER — CHLORDIAZEPOXIDE HCL 10 MG PO CAPS: 20.0000 mg | ORAL_CAPSULE | Freq: Three times a day (TID) | ORAL | 5 refills | Status: DC | PRN

## 2020-02-19 NOTE — Progress Notes (Signed)
Refill for librium sent in at higher dose as we discussed.  Omar Krause. [name], MD 02/19/2020 9:58 AM

## 2020-02-22 ENCOUNTER — Encounter: Payer: Self-pay | Admitting: Family Medicine

## 2020-02-22 NOTE — Telephone Encounter (Signed)
Please advise 

## 2020-02-23 ENCOUNTER — Encounter: Payer: Self-pay | Admitting: Family Medicine

## 2020-02-23 NOTE — Telephone Encounter (Signed)
Please advise 

## 2020-02-24 NOTE — Telephone Encounter (Signed)
Pt refused.

## 2020-02-24 NOTE — Telephone Encounter (Signed)
Referral  send to [facility], Pt refused appointment

## 2020-02-27 ENCOUNTER — Encounter: Payer: Self-pay | Admitting: Family Medicine

## 2020-03-01 NOTE — Telephone Encounter (Signed)
LVM to return call [phone] Schedule appointment if needed

## 2020-03-03 ENCOUNTER — Telehealth (INDEPENDENT_AMBULATORY_CARE_PROVIDER_SITE_OTHER): Payer: Medicaid Other | Admitting: Family Medicine

## 2020-03-03 ENCOUNTER — Encounter: Payer: Self-pay | Admitting: Family Medicine

## 2020-03-03 VITALS — Ht 72.0 in | Wt 350.0 lb

## 2020-03-03 DIAGNOSIS — F411 Generalized anxiety disorder: Secondary | ICD-10-CM

## 2020-03-03 DIAGNOSIS — Z634 Disappearance and death of family member: Secondary | ICD-10-CM

## 2020-03-03 DIAGNOSIS — G4700 Insomnia, unspecified: Secondary | ICD-10-CM

## 2020-03-03 DIAGNOSIS — F321 Major depressive disorder, single episode, moderate: Secondary | ICD-10-CM

## 2020-03-03 MED ORDER — ZOLPIDEM TARTRATE 10 MG PO TABS: 10.0000 mg | ORAL_TABLET | Freq: Every evening | ORAL | 1 refills | Status: DC | PRN

## 2020-03-03 NOTE — Assessment & Plan Note (Addendum)
Continue Wellbutrin 300 mg daily, Zyprexa 10 mg daily, Effexor 225 mg daily, and Remeron 30 mg nightly.

## 2020-03-03 NOTE — Progress Notes (Addendum)
   Omar Krause is a 29 y.o. male who presents today for a virtual office visit.  Assessment/Plan:  New/Acute Problems: Bereavement Offered words of encouragement -we will treat his worsening insomnia, depression, and anxiety as outlined below.  Chronic Problems Addressed Today: Insomnia Worsened.  We will continue Zyprexa 10 mg nightly.  We will add on Ambien 10 mg nightly as well.  He will check in with me in a couple weeks.  Consider trial of Lunesta if no response to Ambien.  Generalized anxiety disorder Worsened.  We will continue Effexor 225 mg daily, gabapentin 300mg  3 times daily, Remeron 30mg  daily, and Zyprexa 10 mg daily.  Continue Librium 20 mg 3 times daily as needed  Depression, major, single episode, moderate (HCC) Continue Wellbutrin 300 mg daily, Zyprexa 10 mg daily, Effexor 225 mg daily, and Remeron 30 mg nightly.     Subjective:  HPI:  Patient here for follow-up.  Was last seen little over a month ago.  Unfortunately since her last visit she has had significantly worsened depression, anxiety, and insomnia.  Had significant difficulty since his mother passed away about 2 weeks ago.  Patient was at home with his mother which passed away.  This has been particularly stressful for him.  He has had quite a bit of difficulty sleeping since then.  He has no longer able to afford ramelteon for sleep.  He was also charged with a DUI since her last visit.  Apparently he was charged with DUI for taking his prescription Klonopin at the time.  Patient states that he was on the way to see his mother in the hospital and was extremely stressed out.  He took a Klonopin on the way to the hospital.  States that he has taken Klonopin in the past as previously prescribed medicine well without any excessive somnolence or drowsiness.  He currently has a lower year and is looking to have the charge dropped.       Objective/Observations  Physical Exam: Gen: NAD, resting  comfortably Pulm: Normal work of breathing Neuro: Grossly normal, moves all extremities Psych: Normal affect and thought content  Virtual Visit via Video   I connected with Omar Krause on 03/16/20 at  4:00 PM EDT by a video enabled telemedicine application and verified that I am speaking with the correct person using two identifiers. The limitations of evaluation and management by telemedicine and the availability of in person appointments were discussed. The patient expressed understanding and agreed to proceed.   Patient location: Home Provider location: [facility] Persons participating in the virtual visit: Myself and Patient     03/18/20. [employer], MD 03/16/2020 8:01 AM

## 2020-03-03 NOTE — Assessment & Plan Note (Addendum)
Worsened.  We will continue Effexor 225 mg daily, gabapentin 300mg  3 times daily, Remeron 30mg  daily, and Zyprexa 10 mg daily.  Continue Librium 20 mg 3 times daily as needed

## 2020-03-03 NOTE — Assessment & Plan Note (Addendum)
Worsened.  We will continue Zyprexa 10 mg nightly.  We will add on Ambien 10 mg nightly as well.  He will check in with me in a couple weeks.  Consider trial of Lunesta if no response to Ambien.

## 2020-03-04 ENCOUNTER — Encounter: Payer: Self-pay | Admitting: Family Medicine

## 2020-03-07 MED ORDER — DIAZEPAM 5 MG PO TABS: 5.0000 mg | ORAL_TABLET | Freq: Two times a day (BID) | ORAL | 5 refills | Status: DC | PRN

## 2020-03-07 NOTE — Telephone Encounter (Signed)
Please Advise

## 2020-03-07 NOTE — Telephone Encounter (Signed)
Pt agree with Rx Valium 5mg  twice daily

## 2020-03-15 ENCOUNTER — Other Ambulatory Visit: Payer: Self-pay | Admitting: Family Medicine

## 2020-03-15 ENCOUNTER — Other Ambulatory Visit: Payer: Self-pay

## 2020-03-15 ENCOUNTER — Telehealth: Payer: Self-pay | Admitting: Family Medicine

## 2020-03-15 NOTE — Telephone Encounter (Signed)
Triage outcome was to be seen within 24 hours; scheduled with Dr.Wolfe due to provider having no availability tomorrow.

## 2020-03-15 NOTE — Telephone Encounter (Signed)
Patient is calling in this afternoon stating he thinks he is having a bad side effect from his medication diazepam (VALIUM) 5 MG tablet, states he started having mild stomach discomfort and has not had a bowel movement in 4-5 days. Had patient triaged by team health.

## 2020-03-15 NOTE — Telephone Encounter (Signed)
Please advise 

## 2020-03-16 ENCOUNTER — Telehealth: Payer: Self-pay | Admitting: Family Medicine

## 2020-03-16 ENCOUNTER — Ambulatory Visit: Payer: Medicaid Other | Admitting: Family Medicine

## 2020-03-16 NOTE — Telephone Encounter (Signed)
Apt made. Unable to contact patient. Will try again later

## 2020-03-16 NOTE — Telephone Encounter (Signed)
Can we call patient and see if we can work him in on our schedule this morning?  For continuity of care, would be better for him to be seen by PCP.  [name]. [name], MD 03/16/2020 8:03 AM

## 2020-03-16 NOTE — Telephone Encounter (Signed)
Patient was NO showed for appt on 03/16/20 @ 1:40PM with Dr. [name]. Patient should have not be No showed. Please advise.

## 2020-03-17 ENCOUNTER — Encounter: Payer: Self-pay | Admitting: Family Medicine

## 2020-03-18 NOTE — Telephone Encounter (Signed)
I have removed this.

## 2020-03-22 ENCOUNTER — Encounter: Payer: Self-pay | Admitting: Family Medicine

## 2020-03-23 ENCOUNTER — Encounter: Payer: Self-pay | Admitting: Family Medicine

## 2020-04-01 ENCOUNTER — Encounter: Payer: Self-pay | Admitting: Family Medicine

## 2020-04-01 ENCOUNTER — Other Ambulatory Visit: Payer: Self-pay | Admitting: Family Medicine

## 2020-04-01 NOTE — Telephone Encounter (Signed)
Patient schedule appointment with PCP

## 2020-04-01 NOTE — Telephone Encounter (Signed)
LAST APPOINTMENT DATE: 01/06/2020 NEXT APPOINTMENT DATE: Visit date not found   Rx Wellbutrin  LAST REFILL: 01/10/2020  QTY: 30 0rf

## 2020-04-01 NOTE — Telephone Encounter (Signed)
Ok to send in - wellbutrin is not controlled.

## 2020-04-04 ENCOUNTER — Other Ambulatory Visit: Payer: Self-pay | Admitting: Family Medicine

## 2020-04-11 ENCOUNTER — Encounter: Payer: Self-pay | Admitting: Family Medicine

## 2020-04-14 ENCOUNTER — Ambulatory Visit: Payer: Medicaid Other | Admitting: Family Medicine

## 2020-05-11 ENCOUNTER — Other Ambulatory Visit: Payer: Self-pay | Admitting: Family Medicine

## 2020-05-11 ENCOUNTER — Encounter: Payer: Self-pay | Admitting: Family Medicine

## 2020-05-11 NOTE — Telephone Encounter (Signed)
Rx request 

## 2020-05-12 ENCOUNTER — Other Ambulatory Visit: Payer: Self-pay | Admitting: Family Medicine

## 2020-05-12 MED ORDER — CLONAZEPAM 2 MG PO TABS: 2.0000 mg | ORAL_TABLET | Freq: Two times a day (BID) | ORAL | 0 refills | Status: DC | PRN

## 2020-06-10 ENCOUNTER — Other Ambulatory Visit: Payer: Self-pay | Admitting: Family Medicine

## 2020-06-18 ENCOUNTER — Encounter: Payer: Self-pay | Admitting: Family Medicine

## 2020-06-28 ENCOUNTER — Other Ambulatory Visit: Payer: Self-pay

## 2020-06-28 DIAGNOSIS — F411 Generalized anxiety disorder: Secondary | ICD-10-CM

## 2020-06-28 DIAGNOSIS — F321 Major depressive disorder, single episode, moderate: Secondary | ICD-10-CM

## 2020-07-01 ENCOUNTER — Telehealth (HOSPITAL_COMMUNITY): Payer: Self-pay | Admitting: *Deleted

## 2020-07-01 NOTE — Telephone Encounter (Signed)
Patient called and [facility] stating that he was a patient of provider a while back (October 2019) and got off on a bad foot but he is just wondering if provider could write him a letter for emotional support animal since she was the one that diagnosed him with PTSD.   Staff called patient and informed him that due to policy and him not being seen since 07-2018, patient is needing to have a new referral from PCP and patient verbalized understanding and stated he will get that started.   Message will be sent to provider.

## 2020-07-01 NOTE — Telephone Encounter (Signed)
Noted, thanks!

## 2020-08-02 ENCOUNTER — Telehealth: Payer: Self-pay

## 2020-08-02 MED ORDER — OLANZAPINE 10 MG PO TABS
10.0000 mg | ORAL_TABLET | Freq: Every day | ORAL | 0 refills | Status: DC
Start: 2020-08-02 — End: 2020-12-09

## 2020-08-02 NOTE — Telephone Encounter (Signed)
Please advise 

## 2020-08-02 NOTE — Telephone Encounter (Signed)
Ok with me. Please place any necessary orders. 

## 2020-08-02 NOTE — Telephone Encounter (Signed)
Medication sent to pharmacy. Called and lm to make pt aware.

## 2020-08-02 NOTE — Telephone Encounter (Signed)
Pt is traveling in [place]. Pt states he left his olanzapine 10 MG at home. Pt asked if Dr. [name] could send in 3 day supply for him to:   [facility]  [address]  [place], [place] [address]

## 2020-08-09 ENCOUNTER — Encounter: Payer: Self-pay | Admitting: Family Medicine

## 2020-08-10 NOTE — Telephone Encounter (Signed)
Ok to [employer] letter Please advise

## 2020-08-12 ENCOUNTER — Encounter: Payer: Self-pay | Admitting: Family Medicine

## 2020-09-14 ENCOUNTER — Encounter: Payer: Self-pay | Admitting: Family Medicine

## 2020-09-19 MED ORDER — CLONAZEPAM 2 MG PO TABS: 2.0000 mg | ORAL_TABLET | Freq: Two times a day (BID) | ORAL | 0 refills | Status: DC | PRN

## 2020-09-19 MED ORDER — PREDNISONE 50 MG PO TABS: ORAL_TABLET | ORAL | 0 refills | Status: DC

## 2020-12-08 ENCOUNTER — Encounter: Payer: Self-pay | Admitting: Family Medicine

## 2020-12-09 ENCOUNTER — Other Ambulatory Visit: Payer: Self-pay | Admitting: *Deleted

## 2020-12-09 MED ORDER — OLANZAPINE 10 MG PO TABS
10.0000 mg | ORAL_TABLET | Freq: Every day | ORAL | 0 refills | Status: DC
Start: 2020-12-09 — End: 2021-03-06

## 2021-03-06 ENCOUNTER — Telehealth: Payer: Self-pay

## 2021-03-06 ENCOUNTER — Other Ambulatory Visit: Payer: Self-pay

## 2021-03-06 MED ORDER — OLANZAPINE 10 MG PO TABS
10.0000 mg | ORAL_TABLET | Freq: Every day | ORAL | 0 refills | Status: DC
Start: 2021-03-06 — End: 2021-08-04

## 2021-03-06 MED ORDER — VENLAFAXINE HCL ER 150 MG PO CP24: 150.0000 mg | ORAL_CAPSULE | Freq: Every day | ORAL | 0 refills | Status: DC

## 2021-03-06 NOTE — Telephone Encounter (Signed)
Rx sent in

## 2021-03-06 NOTE — Telephone Encounter (Signed)
Nurse Assessment Nurse: Neta Ehlers, RN, Leah Date/Time (Eastern Time): 03/05/2021 4:31:45 PM Confirm and document reason for call. If symptomatic, describe symptoms. ---Caller states he was a pt with the office over a year ago, but has since moved to [place]. Pt states he is looking for PCP where he lives, but has run out of Zyprexa 10 mg PO q hs and Effexor 150 mg PO qd per Dr. [name]. Pt needs rx called in until he can find a PCP. No current s/sx. Pt uses [facility] in [place] @ [phone] Does the patient have any new or worsening symptoms? ---No Disp. Time [name] Time) Disposition Final User 03/05/2021 4:23:48 PM Send To Nurse Marvell Fuller, RN, [facility] 03/05/2021 4:46:39 PM Pharmacy Call Reasor, RN, Tacey Ruiz Reason: Rx for Zyprexa 10 mg PO q hs, # 3, no refills called into [facility] in [place] @ [phone] per client directives. RN unable to call rx for Effexor 150 mg PO qd since listed pharmacy had not filled rx before. VM left on contact number listed with instructions to call PLEASE NOTE: All timestamps contained within this report are represented as [place] Standard Time. CONFIDENTIALTY NOTICE: This fax transmission is intended only for the addressee. It contains information that is legally privileged, confidential or otherwise protected from use or disclosure. If you are not the intended recipient, you are strictly prohibited from reviewing, disclosing, copying using or disseminating any of this information or taking any action in reliance on or regarding this information. If you have received this fax in error, please notify us immediately by telephone so that we can arrange for its return to [place]. Phone: [phone], Toll-Free: [phone], Fax: [phone] Page: 2 of 2 Call Id: [id number] Disp. Time [name] Time) Disposition Final User office in AM to get full refill rx called into pharmacy 03/05/2021 4:46:48 PM Clinical Call Yes Reasor, RN, Clint Lipps

## 2021-03-06 NOTE — Telephone Encounter (Signed)
It is ok to send in a 30 day supply. We will not be able to refill any further rx beyond this.  [name]. [name], MD 03/06/2021 8:28 AM

## 2021-08-01 ENCOUNTER — Encounter: Payer: Self-pay | Admitting: Family Medicine

## 2021-08-04 ENCOUNTER — Ambulatory Visit (INDEPENDENT_AMBULATORY_CARE_PROVIDER_SITE_OTHER): Payer: Self-pay | Admitting: Family Medicine

## 2021-08-04 ENCOUNTER — Encounter: Payer: Self-pay | Admitting: Family Medicine

## 2021-08-04 ENCOUNTER — Other Ambulatory Visit: Payer: Self-pay

## 2021-08-04 VITALS — BP 184/105 | HR 90 | Temp 98.0°F | Ht 72.0 in | Wt 300.8 lb

## 2021-08-04 DIAGNOSIS — F321 Major depressive disorder, single episode, moderate: Secondary | ICD-10-CM

## 2021-08-04 DIAGNOSIS — I10 Essential (primary) hypertension: Secondary | ICD-10-CM

## 2021-08-04 DIAGNOSIS — K5900 Constipation, unspecified: Secondary | ICD-10-CM

## 2021-08-04 DIAGNOSIS — R1013 Epigastric pain: Secondary | ICD-10-CM

## 2021-08-04 DIAGNOSIS — K219 Gastro-esophageal reflux disease without esophagitis: Secondary | ICD-10-CM

## 2021-08-04 DIAGNOSIS — F411 Generalized anxiety disorder: Secondary | ICD-10-CM

## 2021-08-04 MED ORDER — CLONAZEPAM 2 MG PO TABS: 2.0000 mg | ORAL_TABLET | Freq: Two times a day (BID) | ORAL | 0 refills | Status: DC | PRN

## 2021-08-04 MED ORDER — HYDROCHLOROTHIAZIDE 25 MG PO TABS: 25.0000 mg | ORAL_TABLET | Freq: Every day | ORAL | 3 refills | Status: DC

## 2021-08-04 MED ORDER — LOSARTAN POTASSIUM 50 MG PO TABS: 50.0000 mg | ORAL_TABLET | Freq: Every day | ORAL | 3 refills | Status: DC

## 2021-08-04 NOTE — Addendum Note (Signed)
Addended by: Lorn Junes on: 08/04/2021 02:35 PM   Modules accepted: Orders

## 2021-08-04 NOTE — Assessment & Plan Note (Signed)
Overall doing much better since going back to [place].  He still has occasional panic attacks.  He has done welll on clonazepam in the past.  Will refill today.

## 2021-08-04 NOTE — Assessment & Plan Note (Signed)
On Protonix 40 mg daily.  Could be causing some of his epigastric pain.  If no obvious etiology on labs will refer to GI to evaluate for possible PUD.

## 2021-08-04 NOTE — Patient Instructions (Signed)
It was very nice to see you today!  We will check blood work today.  I will be looking at your gallbladder, pancreas, blood work to make sure there is nothing else that is going on.  We may need to get further imaging or refer you to see gastroenterologist pending results.  We will restart your HCTZ and losartan.  Please send a message in 1 to 2 weeks to let me your blood pressures are looking.  Take care, Dr [name]  PLEASE NOTE:  If you had any lab tests please let us know if you have not heard back within a few days. You may see your results on mychart before we have a chance to review them but we will give you a call once they are reviewed by [place]. If we ordered any referrals today, please let us know if you have not heard from their office within the next week.   Please try these tips to maintain a healthy lifestyle:  Eat at least 3 REAL meals and 1-2 snacks per day.  Aim for no more than 5 hours between eating.  If you eat breakfast, please do so within one hour of getting up.   Each meal should contain half fruits/vegetables, one quarter protein, and one quarter carbs (no bigger than a computer mouse)  Cut down on sweet beverages. This includes juice, soda, and sweet tea.   Drink at least 1 glass of water with each meal and aim for at least 8 glasses per day  Exercise at least 150 minutes every week.

## 2021-08-04 NOTE — Assessment & Plan Note (Signed)
Has been off all medications for the last few weeks.  We will restart losartan 50 mg daily and HCTZ 25 mg daily check labs today.  We will hold off on beta-blocker today.  If any blood in the future would consider carvedilol.

## 2021-08-04 NOTE — Assessment & Plan Note (Signed)
Continue over-the-counter laxatives.

## 2021-08-04 NOTE — Assessment & Plan Note (Signed)
Doing well. ESA letter given today.

## 2021-08-04 NOTE — Progress Notes (Signed)
Omar Krause is a 30 y.o. male who presents today for an office visit.  Assessment/Plan:  New/Acute Problems: Epigastric Abdominal Pain Check labs including CBC, c-Met, and lipase.  He does have a history of GERD as well.  Depending on results of labs may need to get a right upper quadrant ultrasound versus referral to GI to rule out PUD.  Discussed reasons return to care and seek emergent care.  Chronic Problems Addressed Today: Generalized anxiety disorder Overall doing much better since going back to [place].  He still has occasional panic attacks.  He has done welll on clonazepam in the past.  Will refill today.  HTN (hypertension) Has been off all medications for the last few weeks.  We will restart losartan 50 mg daily and HCTZ 25 mg daily check labs today.  We will hold off on beta-blocker today.  If any blood in the future would consider carvedilol.  Depression, major, single episode, moderate ([place]) Doing well. ESA letter given today.   Constipation Continue over-the-counter laxatives.  GERD (gastroesophageal reflux disease) On Protonix 40 mg daily.  Could be causing some of his epigastric pain.  If no obvious etiology on labs will refer to GI to evaluate for possible PUD.      Subjective:  HPI:  CC of the Pt is abdominal pain.   Back in June, he had a fecal impaction with undetermined cause. This was treated, and he recovered. However, several weeks ago he has been experiencing nausea and difficulty eating. He admits to experiencing dry heaving, denies pain and admits to experiencing pressure in the upper middle abdomen. He denies fever, but admits to chills, and has been using laxatives due to issues with constipation. Constipation seems to be improving.   Pt expresses concern about a stomach infection due to having to eat a lot of fast food in his time in [place], and his concern about other people preparing his food.   He does not know of a FMHx of  gallbladder issues, but he does state that his father had a history of stomach complications which he did end up going to the hospital for, but he is unsure of the condition/details of the issue.  In regards to his mood/anxiety, he has improved significantly. The only recent episode was yesterday, but he states that it had a logical cause as his dog had been lost during a dog sitting while he was at work. However, he expresses an interest in a short term prescription of anxiety medication in case of if something else were to occur during this unstable period of time.  He has been checking his blood pressure at home. The prescribed blood pressure medications have not been able to be refilled due to the pharmacy refusing to fulfill the prescription since having moving back here from [place]. With the use of the medication, he states that readings have been 130/97 for example. BP Readings from Last 3 Encounters:  08/04/21 (!) 184/105  01/17/20 (!) 160/74  12/03/19 (!) 168/92    He has not experienced any side effects with the prescribed medications.       Objective:  Physical Exam: BP (!) 184/105   Pulse 90   Temp 98 F (36.7 C) (Temporal)   Ht 6' (1.829 m)   Wt (!) 300 lb 12.8 oz (136.4 kg)   SpO2 99%   BMI 40.80 kg/m   Gen: No acute distress, resting comfortably CV: Regular rate and rhythm with no murmurs appreciated  Pulm: Normal work of breathing, clear to auscultation bilaterally with no crackles, wheezes, or rhonchi GI: Bowel sounds present.  Tender to palpation in epigastric area.  Murphy sign negative Neuro: Grossly normal, moves all extremities Psych: Normal affect and thought content      I,Jordan Kelly,acting as a scribe for Dimas Chyle, MD.,have documented all relevant documentation on the behalf of Dimas Chyle, MD,as directed by  Dimas Chyle, MD while in the presence of Dimas Chyle, MD.  I, Dimas Chyle, MD, have reviewed all documentation for this visit. The  documentation on 08/04/21 for the exam, diagnosis, procedures, and orders are all accurate and complete.  Algis Greenhouse. Jerline Pain, MD 08/04/2021 2:33 PM

## 2021-08-05 LAB — COMPREHENSIVE METABOLIC PANEL
AG Ratio: 2.1 (calc) (ref 1.0–2.5)
ALT: 45 U/L (ref 9–46)
AST: 33 U/L (ref 10–40)
Albumin: 5.1 g/dL (ref 3.6–5.1)
Alkaline phosphatase (APISO): 69 U/L (ref 36–130)
BUN: 9 mg/dL (ref 7–25)
CO2: 17 mmol/L — ABNORMAL LOW (ref 20–32)
Calcium: 10.1 mg/dL (ref 8.6–10.3)
Chloride: 107 mmol/L (ref 98–110)
Creat: 0.82 mg/dL (ref 0.60–1.26)
Globulin: 2.4 g/dL (calc) (ref 1.9–3.7)
Glucose, Bld: 90 mg/dL (ref 65–99)
Potassium: 3.9 mmol/L (ref 3.5–5.3)
Sodium: 142 mmol/L (ref 135–146)
Total Bilirubin: 0.6 mg/dL (ref 0.2–1.2)
Total Protein: 7.5 g/dL (ref 6.1–8.1)

## 2021-08-05 LAB — CBC
HCT: 45.4 % (ref 38.5–50.0)
Hemoglobin: 15.4 g/dL (ref 13.2–17.1)
MCH: 29.6 pg (ref 27.0–33.0)
MCHC: 33.9 g/dL (ref 32.0–36.0)
MCV: 87.3 fL (ref 80.0–100.0)
MPV: 9.8 fL (ref 7.5–12.5)
Platelets: 339 10*3/uL (ref 140–400)
RBC: 5.2 10*6/uL (ref 4.20–5.80)
RDW: 13 % (ref 11.0–15.0)
WBC: 9.8 10*3/uL (ref 3.8–10.8)

## 2021-08-05 LAB — LIPASE: Lipase: 61 U/L — ABNORMAL HIGH (ref 7–60)

## 2021-08-05 LAB — TSH: TSH: 1.63 mIU/L (ref 0.40–4.50)

## 2021-08-06 ENCOUNTER — Encounter: Payer: Self-pay | Admitting: Family Medicine

## 2021-08-07 ENCOUNTER — Other Ambulatory Visit: Payer: Self-pay

## 2021-08-07 DIAGNOSIS — K219 Gastro-esophageal reflux disease without esophagitis: Secondary | ICD-10-CM

## 2021-08-07 DIAGNOSIS — R1013 Epigastric pain: Secondary | ICD-10-CM

## 2021-08-07 DIAGNOSIS — K5900 Constipation, unspecified: Secondary | ICD-10-CM

## 2021-08-07 NOTE — Progress Notes (Signed)
Please inform patient of the following:  His labs are all essentially normal. Recommend GI referral.  [name]. [name], MD 08/07/2021 7:59 AM

## 2021-08-14 NOTE — Telephone Encounter (Signed)
See note

## 2021-08-15 ENCOUNTER — Encounter: Payer: Self-pay | Admitting: Family Medicine

## 2021-11-09 ENCOUNTER — Ambulatory Visit: Payer: BC Managed Care – PPO | Admitting: Family Medicine

## 2021-11-09 ENCOUNTER — Encounter: Payer: Self-pay | Admitting: Family Medicine

## 2021-11-09 ENCOUNTER — Other Ambulatory Visit: Payer: Self-pay

## 2021-11-09 VITALS — BP 186/99 | HR 102 | Temp 98.0°F | Wt 312.0 lb

## 2021-11-09 DIAGNOSIS — I10 Essential (primary) hypertension: Secondary | ICD-10-CM

## 2021-11-09 DIAGNOSIS — K219 Gastro-esophageal reflux disease without esophagitis: Secondary | ICD-10-CM | POA: Diagnosis not present

## 2021-11-09 MED ORDER — LOSARTAN POTASSIUM-HCTZ 100-25 MG PO TABS: 1.0000 | ORAL_TABLET | Freq: Every day | ORAL | 3 refills | Status: DC

## 2021-11-09 NOTE — Assessment & Plan Note (Signed)
Very elevated today.  His home readings are typically in the 140 range.  No signs of endorgan damage.  We will increase dose of losartan 100 mg daily.  Continue HCTZ 25 mg daily.  He will continue home monitoring and sending message in a few weeks.  May consider addition of beta-blocker if continues to be above goal.

## 2021-11-09 NOTE — Patient Instructions (Signed)
It was very nice to see you today!  It is possible that you could have IBS.  Please call to schedule an appointment with the GI specialist.  You can try taking peppermint oil to see if this helps.  I will send in a combination losartan-HCTZ pill.  Please start this and stop your current blood pressure medications.  Keep an eye on your blood pressure and send a message in a few weeks to let me know how it is looking.  Take care, Dr Jerline Pain  PLEASE NOTE:  If you had any lab tests please let us know if you have not heard back within a few days. You may see your results on mychart before we have a chance to review them but we will give you a call once they are reviewed by [place]. If we ordered any referrals today, please let us know if you have not heard from their office within the next week.   Please try these tips to maintain a healthy lifestyle:  Eat at least 3 REAL meals and 1-2 snacks per day.  Aim for no more than 5 hours between eating.  If you eat breakfast, please do so within one hour of getting up.   Each meal should contain half fruits/vegetables, one quarter protein, and one quarter carbs (no bigger than a computer mouse)  Cut down on sweet beverages. This includes juice, soda, and sweet tea.   Drink at least 1 glass of water with each meal and aim for at least 8 glasses per day  Exercise at least 150 minutes every week.

## 2021-11-09 NOTE — Assessment & Plan Note (Signed)
Could be source of some of his abdominal pain.  He has referral to GI pending.

## 2021-11-09 NOTE — Progress Notes (Signed)
° °  Omar Krause is a 31 y.o. male who presents today for an office visit.  Assessment/Plan:  New/Acute Problems: Abdominal bloating Reassuring exam today.  History consistent with possible IBS given has alternating constipation and diarrhea and relief of symptoms after bowel movement.  PUD and gallbladder pathology also consideration of less likely.  He has referral to GI pending.  He can try over-the-counter peppermint oil.  Chronic Problems Addressed Today: No problem-specific Assessment & Plan notes found for this encounter.     Subjective:  HPI:  Patient here with stomach issue. This has been going on for a week. He notes he gets bloated after eating food. He has to go the bathroom after eating food and ysmptoms improve. His bowel movement has been normal. Have about 3 bowel movements a day. He does had green stool in the beginning but now symptoms has subsided for the past 2 days. He has had some epigastric pain in the last visit but he does not think this is similar. Denies constipation or diarrhea. No nausea or vomiting. Denies blood in stool or abdominal pain at this time.  His blood pressure was elevated in the office today. He notes he had monster energy drink about 2 hours ago. He admit checking his blood pressure at home. His blood pressure is usually 140/90 at home.  Denies chest pain. No fever, chills or nausea. No headaches.         Objective:  Physical Exam: There were no vitals taken for this visit.  Gen: No acute distress, resting comfortably CV: Regular rate and rhythm with no murmurs appreciated Pulm: Normal work of breathing, clear to auscultation bilaterally with no crackles, wheezes, or rhonchi GI: Bowel sounds present, soft, nondistended, nontender Neuro: Grossly normal, moves all extremities Psych: Normal affect and thought content       I,Savera Zaman,acting as a scribe for Jacquiline Doe, MD.,have documented all relevant documentation on the behalf of  Jacquiline Doe, MD,as directed by  Jacquiline Doe, MD while in the presence of Jacquiline Doe, MD.   I, Jacquiline Doe, MD, have reviewed all documentation for this visit. The documentation on 11/09/21 for the exam, diagnosis, procedures, and orders are all accurate and complete.  [name]. [name], MD 11/09/2021 7:56 AM

## 2021-11-25 ENCOUNTER — Encounter: Payer: Self-pay | Admitting: Family Medicine

## 2021-11-28 ENCOUNTER — Encounter: Payer: Self-pay | Admitting: Family Medicine

## 2021-11-28 ENCOUNTER — Ambulatory Visit: Payer: BC Managed Care – PPO | Admitting: Family Medicine

## 2021-11-29 NOTE — Telephone Encounter (Signed)
Left message to return call to our office at their convenience.  Advise to go to UC or schedule appointment with one of our Providers

## 2021-11-30 NOTE — Telephone Encounter (Signed)
Can we have him schedule a visit to discuss?  [name]. [name], MD 11/30/2021 8:30 AM

## 2021-11-30 NOTE — Telephone Encounter (Signed)
Left message to return call to our office at their convenience.  

## 2021-12-07 ENCOUNTER — Encounter: Payer: Self-pay | Admitting: Family Medicine

## 2021-12-15 ENCOUNTER — Ambulatory Visit (INDEPENDENT_AMBULATORY_CARE_PROVIDER_SITE_OTHER): Payer: BC Managed Care – PPO | Admitting: Family Medicine

## 2021-12-15 ENCOUNTER — Encounter: Payer: Self-pay | Admitting: Family Medicine

## 2021-12-15 ENCOUNTER — Other Ambulatory Visit: Payer: Self-pay

## 2021-12-15 VITALS — BP 182/102 | HR 67 | Temp 97.7°F | Ht 72.0 in | Wt 306.0 lb

## 2021-12-15 DIAGNOSIS — F5102 Adjustment insomnia: Secondary | ICD-10-CM

## 2021-12-15 DIAGNOSIS — R197 Diarrhea, unspecified: Secondary | ICD-10-CM

## 2021-12-15 DIAGNOSIS — I10 Essential (primary) hypertension: Secondary | ICD-10-CM

## 2021-12-15 DIAGNOSIS — R1110 Vomiting, unspecified: Secondary | ICD-10-CM | POA: Diagnosis not present

## 2021-12-15 LAB — POC COVID19 BINAXNOW: SARS Coronavirus 2 Ag: NEGATIVE

## 2021-12-15 MED ORDER — CLONAZEPAM 2 MG PO TABS: 2.0000 mg | ORAL_TABLET | Freq: Two times a day (BID) | ORAL | 0 refills | Status: DC | PRN

## 2021-12-15 MED ORDER — PROPRANOLOL HCL ER 60 MG PO CP24: 60.0000 mg | ORAL_CAPSULE | Freq: Every day | ORAL | 0 refills | Status: DC

## 2021-12-15 NOTE — Progress Notes (Signed)
Subjective:     Patient ID: Omar Krause, male    DOB: 16-Jun-1991, 31 y.o.   MRN: [id number]  Chief Complaint  Patient presents with   Diarrhea    Sx started Wednesday    Headache   Emesis   Insomnia    HPI-bp  usu 140/80 Diarrhea for 2 days-started after fired from work- watery. 10x/day. [place] water. No travel. No others ill.  No pain. Some chills.  No fevers.  Work wouldn't let him off in past to see GI-may have IBS HA-past 2 days, constant.   Mostly frontal.  No visual changes.   Drinking plenty of water.  No caffeine Vomiting-twice/day.  Last this am. Worsening insomnia-pt stressed  Was on BB in past for stress symptoms and bp-would like to restart as inc stress and all this.  Health Maintenance Due  Topic Date Due   COVID-19 Vaccine (1) Never done   HIV Screening  Never done   Hepatitis C Screening  Never done    Past Medical History:  Diagnosis Date   Anxiety    Asthma    No inhaler at home, has not had issues since he was child   Depression    Hypertension    Morbid obesity (HCC)    Nausea    Palpitations    Seizures (HCC)     Past Surgical History:  Procedure Laterality Date   CIRCUMCISION     TONSILLECTOMY AND ADENOIDECTOMY      Outpatient Medications Prior to Visit  Medication Sig Dispense Refill   losartan-hydrochlorothiazide (HYZAAR) 100-25 MG tablet Take 1 tablet by mouth daily. 90 tablet 3   Multiple Vitamin (MULTIVITAMIN WITH MINERALS) TABS tablet Take 1 tablet by mouth daily.     clonazePAM (KLONOPIN) 2 MG tablet Take 1 tablet (2 mg total) by mouth 2 (two) times daily as needed for anxiety. (Patient not taking: Reported on 11/09/2021) 30 tablet 0   No facility-administered medications prior to visit.    Allergies  Allergen Reactions   Ceftin [Cefuroxime Axetil] Itching and Other (See Comments)    Dizziness as well   Escitalopram Nausea And Vomiting   Prednisone     Worsens anxiety, causes panic attacks and insomnia.    ROS  neg/noncontributory except as noted HPI/below  No SI.  Just stressed      Objective:     BP (!) 182/102    Pulse 67    Temp 97.7 F (36.5 C) (Temporal)    Ht 6' (1.829 m)    Wt (!) 306 lb (138.8 kg)    SpO2 100%    BMI 41.50 kg/m  Wt Readings from Last 3 Encounters:  12/15/21 (!) 306 lb (138.8 kg)  11/09/21 (!) 312 lb (141.5 kg)  08/04/21 (!) 300 lb 12.8 oz (136.4 kg)        Gen: WDWN NAD MOWM HEENT: NCAT, conjunctiva not injected, sclera nonicteric.  EOMI.  OP moist NECK:  supple, no thyromegaly, no nodes, no carotid bruits CARDIAC: RRR, S1S2+, no murmur. DP 2+B LUNGS: CTAB. No wheezes ABDOMEN:  BS+, soft, NTND, No HSM, no masses EXT:  no edema MSK: no gross abnormalities.  NEURO: A&O x3.  CN II-XII intact.  PSYCH: normal mood. Good eye contact Face pink/splotchy.  Sweats  Results for orders placed or performed in visit on 12/15/21  POC COVID-19  Result Value Ref Range   SARS Coronavirus 2 Ag Negative Negative     Assessment & Plan:   Problem List  Items Addressed This Visit       Cardiovascular and Mediastinum   HTN (hypertension)   Relevant Medications   propranolol ER (INDERAL LA) 60 MG 24 hr capsule     Other   Insomnia   Other Visit Diagnoses     Diarrhea, unspecified type    -  Primary   Relevant Orders   POC COVID-19 (Completed)   Vomiting, unspecified vomiting type, unspecified whether nausea present       Relevant Orders   POC COVID-19 (Completed)      Diarrhea-?viral vs IBS.  Take immodium sparingly.  Keep hydrated.  ER if getting dehydrated.  Get appt sch w/GI Anxiety/HTN-not ideal.  Pt requesting inderal-will start low dose.  F/u Dr. [name] 2 wks Insomnia-worsening d/t increased stress.  Klonopin for sleep short term only.   Meds ordered this encounter  Medications   propranolol ER (INDERAL LA) 60 MG 24 hr capsule    Sig: Take 1 capsule (60 mg total) by mouth daily.    Dispense:  30 capsule    Refill:  0   clonazePAM (KLONOPIN) 2 MG  tablet    Sig: Take 1 tablet (2 mg total) by mouth 2 (two) times daily as needed for anxiety.    Dispense:  10 tablet    Refill:  0    Angelena Sole, MD

## 2021-12-15 NOTE — Patient Instructions (Signed)
Meds have been sent the the pharmacy You can take tylenol for pain/fevers If worsening symptoms, getting dehydrated. let us know or go to the Emergency room   Take immodium 2-3x/day as needed

## 2021-12-29 ENCOUNTER — Ambulatory Visit: Payer: BC Managed Care – PPO | Admitting: Family Medicine

## 2021-12-29 VITALS — BP 149/86 | HR 58 | Temp 98.4°F | Ht 72.0 in | Wt 294.8 lb

## 2021-12-29 DIAGNOSIS — I10 Essential (primary) hypertension: Secondary | ICD-10-CM | POA: Diagnosis not present

## 2021-12-29 DIAGNOSIS — F411 Generalized anxiety disorder: Secondary | ICD-10-CM

## 2021-12-29 DIAGNOSIS — F321 Major depressive disorder, single episode, moderate: Secondary | ICD-10-CM

## 2021-12-29 DIAGNOSIS — F5102 Adjustment insomnia: Secondary | ICD-10-CM

## 2021-12-29 MED ORDER — CLONAZEPAM 1 MG PO TABS: 1.0000 mg | ORAL_TABLET | Freq: Two times a day (BID) | ORAL | 5 refills | Status: DC | PRN

## 2021-12-29 MED ORDER — OLANZAPINE 2.5 MG PO TABS: 2.5000 mg | ORAL_TABLET | Freq: Every day | ORAL | 5 refills | Status: DC

## 2021-12-29 NOTE — Patient Instructions (Signed)
It was very nice to see you today! ? ?I will refill your medications today. ? ?Please start the zyprexa. ? ?Check in with me in a few weeks to let me know how you are doing.  ? ?Take care, ?Dr Jerline Pain ? ?PLEASE NOTE: ? ?If you had any lab tests please let us know if you have not heard back within a few days. You may see your results on mychart before we have a chance to review them but we will give you a call once they are reviewed by [place]. If we ordered any referrals today, please let us know if you have not heard from their office within the next week.  ? ?Please try these tips to maintain a healthy lifestyle: ? ?Eat at least 3 REAL meals and 1-2 snacks per day.  Aim for no more than 5 hours between eating.  If you eat breakfast, please do so within one hour of getting up.  ? ?Each meal should contain half fruits/vegetables, one quarter protein, and one quarter carbs (no bigger than a computer mouse) ? ?Cut down on sweet beverages. This includes juice, soda, and sweet tea.  ? ?Drink at least 1 glass of water with each meal and aim for at least 8 glasses per day ? ?Exercise at least 150 minutes every week.   ? ? ?

## 2021-12-29 NOTE — Assessment & Plan Note (Signed)
Doing a little bit better.  We will continue clonazepam at night as needed.  We will decrease dose to 1 mg.  Patient is aware this should not be a long-term medication.  We will also be starting Zyprexa as below which should help.  He will follow-up in a few weeks via MyChart. ?

## 2021-12-29 NOTE — Assessment & Plan Note (Signed)
Much better controlled today.  Home readings are at goal.  We will continue propranolol 60 mg daily and losartan-HCTZ 100-25 once daily.  He will follow-up with me again in a few weeks via MyChart. ?

## 2021-12-29 NOTE — Assessment & Plan Note (Signed)
Worsened since being fired from his job but this seems to be improving the last couple of weeks.  We discussed medication adjustments.  We will restart Zyprexa.  He has done well with this.  We will start 2.5 mg daily and he will follow-up with me in a few weeks. ?

## 2021-12-29 NOTE — Progress Notes (Signed)
? ?  Omar Krause is a 31 y.o. male who presents today for an office visit. ? ?Assessment/Plan:  ?Chronic Problems Addressed Today: ?Insomnia ?Doing a little bit better.  We will continue clonazepam at night as needed.  We will decrease dose to 1 mg.  Patient is aware this should not be a long-term medication.  We will also be starting Zyprexa as below which should help.  He will follow-up in a few weeks via MyChart. ? ?Generalized anxiety disorder ?Worsened since being fired from his job but this seems to be improving the last couple of weeks.  We discussed medication adjustments.  We will restart Zyprexa.  He has done well with this.  We will start 2.5 mg daily and he will follow-up with me in a few weeks. ? ?HTN (hypertension) ?Much better controlled today.  Home readings are at goal.  We will continue propranolol 60 mg daily and losartan-HCTZ 100-25 once daily.  He will follow-up with me again in a few weeks via MyChart. ? ?  ?Subjective:  ?HPI: ? ?Patient here for follow-up.  Seen by different fiver couple weeks ago with issues with diarrhea.  He has also had worsening anxiety for the last few weeks.  He was started on propranolol 60 mg daily.  He was also given a refill on clonazepam 2 mg twice daily as needed.  He has been under a lot of stress recently.  He was fired from his job a couple of weeks ago.  Is been sleeping a lot more recently.  He has been looking for a new job.  Overall feels like he is in a better place now than it was a couple of weeks ago.  Blood pressures at home of been in the 130s over 80s.  He would like to decrease his dose of clonazepam.. ? ?   ?  ?Objective:  ?Physical Exam: ?BP (!) 149/86 (BP Location: Left Arm)   Pulse (!) 58   Temp 98.4 ?F (36.9 ?C) (Temporal)   Ht 6' (1.829 m)   Wt 294 lb 12.8 oz (133.7 kg)   SpO2 99%   BMI 39.98 kg/m?   ?Gen: No acute distress, resting comfortably ?CV: Regular rate and rhythm with no murmurs appreciated ?Pulm: Normal work of breathing,  clear to auscultation bilaterally with no crackles, wheezes, or rhonchi ?Neuro: Grossly normal, moves all extremities ?Psych: Normal affect and thought content ? ?   ? ?Algis Greenhouse. Jerline Pain, MD ?12/29/2021 10:35 AM  ?

## 2022-01-03 ENCOUNTER — Encounter: Payer: Self-pay | Admitting: Family Medicine

## 2022-01-04 ENCOUNTER — Ambulatory Visit: Payer: BC Managed Care – PPO | Admitting: Family Medicine

## 2022-01-04 NOTE — Progress Notes (Incomplete)
? ?  Omar Krause is a 31 y.o. male who presents today for an office visit. ? ?Assessment/Plan:  ?New/Acute Problems: ?*** ? ?Chronic Problems Addressed Today: ?No problem-specific Assessment & Plan notes found for this encounter. ? ? ?  ?Subjective:  ?HPI: ? ?Patient here with chest pain for the past month.   ? ?   ?  ?Objective:  ?Physical Exam: ?There were no vitals taken for this visit.  ?Gen: No acute distress, resting comfortably*** ?CV: Regular rate and rhythm with no murmurs appreciated ?Pulm: Normal work of breathing, clear to auscultation bilaterally with no crackles, wheezes, or rhonchi ?Neuro: Grossly normal, moves all extremities ?Psych: Normal affect and thought content ? ?   ? ? ?I,Savera Zaman,acting as a Neurosurgeon for Jacquiline Doe, MD.,have documented all relevant documentation on the behalf of Jacquiline Doe, MD,as directed by  Jacquiline Doe, MD while in the presence of Jacquiline Doe, MD.  ? ?*** ?[name]. [name], MD ?01/04/2022 9:49 AM  ? ?

## 2022-01-05 ENCOUNTER — Encounter: Payer: Self-pay | Admitting: Family Medicine

## 2022-01-05 ENCOUNTER — Ambulatory Visit: Payer: BC Managed Care – PPO | Admitting: Family Medicine

## 2022-01-06 ENCOUNTER — Other Ambulatory Visit: Payer: Self-pay | Admitting: Family Medicine

## 2022-01-09 ENCOUNTER — Ambulatory Visit: Payer: BC Managed Care – PPO | Admitting: Family Medicine

## 2022-01-09 VITALS — BP 168/102 | HR 85 | Temp 99.1°F | Ht 72.0 in | Wt 302.4 lb

## 2022-01-09 DIAGNOSIS — F411 Generalized anxiety disorder: Secondary | ICD-10-CM

## 2022-01-09 DIAGNOSIS — F321 Major depressive disorder, single episode, moderate: Secondary | ICD-10-CM | POA: Diagnosis not present

## 2022-01-09 DIAGNOSIS — I10 Essential (primary) hypertension: Secondary | ICD-10-CM

## 2022-01-09 DIAGNOSIS — K219 Gastro-esophageal reflux disease without esophagitis: Secondary | ICD-10-CM

## 2022-01-09 DIAGNOSIS — J4520 Mild intermittent asthma, uncomplicated: Secondary | ICD-10-CM

## 2022-01-09 DIAGNOSIS — R079 Chest pain, unspecified: Secondary | ICD-10-CM

## 2022-01-09 MED ORDER — PANTOPRAZOLE SODIUM 40 MG PO TBEC: 40.0000 mg | DELAYED_RELEASE_TABLET | Freq: Every day | ORAL | 3 refills | Status: DC

## 2022-01-09 MED ORDER — BUPROPION HCL ER (XL) 150 MG PO TB24: 150.0000 mg | ORAL_TABLET | Freq: Every day | ORAL | 5 refills | Status: DC

## 2022-01-09 NOTE — Patient Instructions (Signed)
It was very nice to see you today! ? ?Please start the Protonix.  See me message in a few weeks to let me know how your symptoms are doing. ? ?We will also start Wellbutrin today. ? ?I will refer you to see the pulmonologist. ? ?Please try placing a small amount of floss or cotton swab under your toenail in addition to soaking in Epsom salt.  Let me know if not improving. ? ? ?Take care, ?Dr Jerline Pain ? ?PLEASE NOTE: ? ?If you had any lab tests please let us know if you have not heard back within a few days. You may see your results on mychart before we have a chance to review them but we will give you a call once they are reviewed by [place]. If we ordered any referrals today, please let us know if you have not heard from their office within the next week.  ? ?Please try these tips to maintain a healthy lifestyle: ? ?Eat at least 3 REAL meals and 1-2 snacks per day.  Aim for no more than 5 hours between eating.  If you eat breakfast, please do so within one hour of getting up.  ? ?Each meal should contain half fruits/vegetables, one quarter protein, and one quarter carbs (no bigger than a computer mouse) ? ?Cut down on sweet beverages. This includes juice, soda, and sweet tea.  ? ?Drink at least 1 glass of water with each meal and aim for at least 8 glasses per day ? ?Exercise at least 150 minutes every week.   ?

## 2022-01-09 NOTE — Progress Notes (Signed)
? ?Omar Krause is a 31 y.o. male who presents today for an office visit. ? ?Assessment/Plan:  ?New/Acute Problems: ?Atypical Chest Pain ?History very atypical for cardiac etiology.  EKG today with normal sinus rhythm and no signs of ischemic changes.  He does have underlying anxiety which could be contributing though he also does have a history of GERD as well as underlying asthma.  We will try PPI for the next few weeks to see if this helps with his symptoms.  We will be managing his asthma as below.  He will let me know if not improving.  Discussed reasons to return to care. ? ? 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  Still has occasional cough.  Has not used albuterol inhaler in a while.  Does not have any recent PFTs.  Will place referral to pulmonology for PFT evaluation. ? ?Generalized anxiety disorder ?Not much difference since our last visit.  He just started Zyprexa 2.5 mg daily about a week and a half ago.  We will continue with current dose for another few weeks and he will check with me via MyChart.  Could consider readdition of Effexor if still not well controlled on Zyprexa. ? ?GERD (gastroesophageal reflux disease) ?Could be contributing some to his atypical chest pain.  We will start trial of PPI for the next few weeks. ? ?Depression, major, single episode, moderate (HCC) ?Still has quite a bit of daytime somnolence.  We will start Wellbutrin.  He has done well with this in the past.  He will check with me in a few weeks via MyChart. ? ?HTN (hypertension) ?Elevated today.  Home readings typically in the 130s over 80s.  He has been under quite a bit of stress recently.  We will continue current regimen of propranolol 60 mg daily and losartan 100-25 once daily.  We will continue home monitoring.  Would consider increasing dose of propranolol if this continues to be  elevated. ? ? ?  ?Subjective:  ?HPI: ? ?Patient here with chest wall pain. This has been going on for the past several months. Located mostly on left side of the chest. Symptoms occurred at times. His episodes usually last about 15 minutes. He gets chest pain every 2-3 days.  He described this as "discomfort or pressure". Comes and goes. He has tried Aspirin but not sure if it helped. 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 He is concerned this could be due to a chemical exposure. He also has increased anxiety and thinks this could be contributing as well. He does get heartburn after eating spicy food. Cough occasionally but denies any shortness of breath.  No symptoms with exertion.   No injuries or other obvious precipitating events.  No vomiting or fever. ? ?He also complain of worsening anxiety. We started him on Zyprexa in the last visit. He has been under a lot of stress. He was recently fired from his job. He is currently taking Zyprexa 2.5 mg daily. He notes this has been helping with sleep. He did not noticed any improvement with anxiety. No SI or HI.  ? ?His blood pressure at office was elevated today. He notes his readings at home are usually 135/85. He forgot to take his medication yesterday and thinks this could be contributing. He is currently on HCTZ 100-25 once daily. No headaches. ?   ?  ?Objective:  ?  Physical Exam: ?BP (!) 168/102 (BP Location: Left Arm)   Pulse 85   Temp 99.1 ?F (37.3 ?C) (Temporal)   Ht 6' (1.829 m)   Wt (!) 302 lb 6.4 oz (137.2 kg)   SpO2 98%   BMI 41.01 kg/m?   ?Gen: No acute distress, resting comfortably ?CV: Regular rate and rhythm with no murmurs appreciated ?Pulm: Normal work of breathing, clear to auscultation bilaterally with no crackles, wheezes, or rhonchi ?Neuro: Grossly normal, moves all extremities ?Psych: Normal affect and thought content ? ?EKG: NSR.  No ischemic changes. ? ?   ? ? ?I,Savera Zaman,acting as a Neurosurgeon for Jacquiline Doe, MD.,have  documented all relevant documentation on the behalf of Jacquiline Doe, MD,as directed by  Jacquiline Doe, MD while in the presence of Jacquiline Doe, MD.  ? ?I, Jacquiline Doe, MD, have reviewed all documentation for this visit. The documentation on 01/09/22 for the exam, diagnosis, procedures, and orders are all accurate and complete. ? ?Time Spent: ?45 minutes of total time was spent on the date of the encounter performing the following actions: chart review prior to seeing the patient, obtaining history, performing a medically necessary exam, counseling on the treatment plan, placing orders, and documenting in our EHR.  ? ? ?[name]. [name], MD ?01/09/2022 2:32 PM  ? ?

## 2022-01-09 NOTE — Assessment & Plan Note (Signed)
Could be contributing some to his atypical chest pain.  We will start trial of PPI for the next few weeks. ?

## 2022-01-09 NOTE — Assessment & Plan Note (Signed)
Elevated today.  Home readings typically in the 130s over 80s.  He has been under quite a bit of stress recently.  We will continue current regimen of propranolol 60 mg daily and losartan 100-25 once daily.  We will continue home monitoring.  Would consider increasing dose of propranolol if this continues to be elevated. ?

## 2022-01-09 NOTE — Assessment & Plan Note (Signed)
Still has quite a bit of daytime somnolence.  We will start Wellbutrin.  He has done well with this in the past.  He will check with me in a few weeks via MyChart. ?

## 2022-01-09 NOTE — Assessment & Plan Note (Signed)
Not much difference since our last visit.  He just started Zyprexa 2.5 mg daily about a week and a half ago.  We will continue with current dose for another few weeks and he will check with me via MyChart.  Could consider readdition of Effexor if still not well controlled on Zyprexa.

## 2022-01-09 NOTE — Assessment & Plan Note (Signed)
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  Still has occasional cough.  Has not used albuterol inhaler in a while.  Does not have any recent PFTs.  Will place referral to pulmonology for PFT evaluation. ?

## 2022-01-10 ENCOUNTER — Telehealth: Payer: Self-pay | Admitting: *Deleted

## 2022-01-10 NOTE — Telephone Encounter (Signed)
Key: Glendon Axe - Rx #: [id number] ?Outcome ?Approved today ?Effective from 01/10/2022 through 01/09/2023. ?Drug ?Pantoprazole Sodium 40MG  dr tablets ?Pharmacy notified  ?

## 2022-01-18 ENCOUNTER — Encounter: Payer: Self-pay | Admitting: Family Medicine

## 2022-01-19 NOTE — Telephone Encounter (Signed)
See note

## 2022-01-22 ENCOUNTER — Telehealth: Payer: Self-pay | Admitting: Family Medicine

## 2022-01-22 ENCOUNTER — Other Ambulatory Visit: Payer: Self-pay | Admitting: *Deleted

## 2022-01-22 MED ORDER — TRAZODONE HCL 50 MG PO TABS: 50.0000 mg | ORAL_TABLET | Freq: Every day | ORAL | 0 refills | Status: DC

## 2022-01-22 NOTE — Telephone Encounter (Signed)
Spoke with patient, patient agreed with medication  ?Rx send to pharmacy  ?

## 2022-01-22 NOTE — Telephone Encounter (Signed)
I appreciate the update. We can try a sleep medication if he wants. We can send in trazodone 50mg  nightly as needed. This could help some with the anxiety as well. Please send in any needed refills. ? ? . [name], MD ?01/22/2022 1:09 PM  ? ?

## 2022-01-22 NOTE — Telephone Encounter (Signed)
Pt is returning a call stating he got disconnected. Requesting a call back ?

## 2022-01-22 NOTE — Telephone Encounter (Signed)
Left message to return call to our office at their convenience.  

## 2022-01-23 NOTE — Telephone Encounter (Signed)
See previews note 

## 2022-01-25 ENCOUNTER — Encounter: Payer: Self-pay | Admitting: Family Medicine

## 2022-01-25 ENCOUNTER — Other Ambulatory Visit: Payer: Self-pay | Admitting: *Deleted

## 2022-01-25 MED ORDER — METOCLOPRAMIDE HCL 10 MG PO TABS: 10.0000 mg | ORAL_TABLET | Freq: Four times a day (QID) | ORAL | 0 refills | Status: DC | PRN

## 2022-01-25 NOTE — Telephone Encounter (Signed)
Please advise 

## 2022-01-25 NOTE — Telephone Encounter (Signed)
Ok to send in rx for reglan 10mg  every 6 hours as needed. Dispense #30.  Recommend follow up visit soon if not improving.  ? ?Algis Greenhouse. Jerline Pain, MD ?01/25/2022 1:45 PM  ? ?

## 2022-02-01 ENCOUNTER — Encounter: Payer: Self-pay | Admitting: Family Medicine

## 2022-02-01 NOTE — Telephone Encounter (Signed)
Left message to return call to our office at their convenience.  

## 2022-02-02 ENCOUNTER — Other Ambulatory Visit: Payer: Self-pay | Admitting: Family Medicine

## 2022-02-03 ENCOUNTER — Encounter: Payer: Self-pay | Admitting: Family Medicine

## 2022-02-05 ENCOUNTER — Telehealth: Payer: Self-pay

## 2022-02-05 ENCOUNTER — Other Ambulatory Visit: Payer: Self-pay

## 2022-02-05 ENCOUNTER — Other Ambulatory Visit: Payer: Self-pay | Admitting: Physician Assistant

## 2022-02-05 MED ORDER — PROPRANOLOL HCL ER 60 MG PO CP24: ORAL_CAPSULE | ORAL | 3 refills | Status: DC

## 2022-02-05 MED ORDER — CLONAZEPAM 1 MG PO TABS: 1.0000 mg | ORAL_TABLET | Freq: Two times a day (BID) | ORAL | 5 refills | Status: DC | PRN

## 2022-02-05 MED ORDER — LOSARTAN POTASSIUM-HCTZ 100-25 MG PO TABS: 1.0000 | ORAL_TABLET | Freq: Every day | ORAL | 3 refills | Status: DC

## 2022-02-05 MED ORDER — CLONAZEPAM 1 MG PO TABS: 1.0000 mg | ORAL_TABLET | Freq: Two times a day (BID) | ORAL | 0 refills | Status: DC | PRN

## 2022-02-05 NOTE — Telephone Encounter (Signed)
Patient needing refill for Clonazpam 1 mg sent to [facility] Dr Marcy Panning. Can you please send this one for me as I have sent other refills he needed. Thanks ?

## 2022-02-05 NOTE — Telephone Encounter (Signed)
I think that would be a reasonable approach if he is interested. I am happy to help support him in whatever way we can though I would recommend he contact a disability lawyer as a first step. ? ?Algis Greenhouse. Jerline Pain, MD ?02/05/2022 9:42 AM  ? ?

## 2022-02-06 ENCOUNTER — Encounter: Payer: Self-pay | Admitting: Family Medicine

## 2022-02-08 NOTE — Telephone Encounter (Signed)
I hate to hear that he is having issues with his nightmares. There is a medication called prazosin that we could try if he is interested.  This can help with nightmares related to PTSD issues.  Okay to send in 1 mg nightly if he is interested. ? ?Therapy can also be beneficial for this.  We can refer him to a therapist if he is interested. ? ?[name]. [name], MD ?02/08/2022 11:57 AM  ? ?

## 2022-02-12 ENCOUNTER — Other Ambulatory Visit: Payer: Self-pay | Admitting: *Deleted

## 2022-02-12 DIAGNOSIS — F4310 Post-traumatic stress disorder, unspecified: Secondary | ICD-10-CM

## 2022-02-12 MED ORDER — PRAZOSIN HCL 1 MG PO CAPS: 1.0000 mg | ORAL_CAPSULE | Freq: Every day | ORAL | 0 refills | Status: DC

## 2022-02-12 NOTE — Telephone Encounter (Signed)
Rx and referral placed  

## 2022-02-26 ENCOUNTER — Encounter: Payer: Self-pay | Admitting: Family Medicine

## 2022-02-26 ENCOUNTER — Telehealth (INDEPENDENT_AMBULATORY_CARE_PROVIDER_SITE_OTHER): Payer: 59 | Admitting: Family Medicine

## 2022-02-26 VITALS — Ht 72.0 in | Wt 280.0 lb

## 2022-02-26 DIAGNOSIS — F4310 Post-traumatic stress disorder, unspecified: Secondary | ICD-10-CM | POA: Diagnosis not present

## 2022-02-26 DIAGNOSIS — R69 Illness, unspecified: Secondary | ICD-10-CM | POA: Diagnosis not present

## 2022-02-26 DIAGNOSIS — F411 Generalized anxiety disorder: Secondary | ICD-10-CM

## 2022-02-26 MED ORDER — VENLAFAXINE HCL ER 75 MG PO CP24: 75.0000 mg | ORAL_CAPSULE | Freq: Every day | ORAL | 5 refills | Status: DC

## 2022-02-26 NOTE — Assessment & Plan Note (Signed)
Still having quite a bit of sleep disturbances.  Prazosin has helped modestly.  We will increase dose to 2 mg nightly.  We will also place referral to psychiatry as above.  We will also be starting Effexor as above which should hopefully help some with his PTSD symptoms as well.  He will follow-up with me in a couple weeks via MyChart and we can adjust the dose of prazosin as needed. ?

## 2022-02-26 NOTE — Progress Notes (Signed)
? ?  Omar Krause is a 31 y.o. male who presents today for a virtual office visit. ? ?Assessment/Plan:  ?Chronic Problems Addressed Today: ?Generalized anxiety disorder ?Not controlled.  He has not had much success with several SSRIs in the past.  Effexor worked modestly well in the past and he is willing to try this again.  We will start 75 mg daily.  He will check in with me in 1 to 2 weeks via MyChart and we can titrate the dose as tolerated.  We will also place referral for him to see psychiatrist. ? ?PTSD (post-traumatic stress disorder) ?Still having quite a bit of sleep disturbances.  Prazosin has helped modestly.  We will increase dose to 2 mg nightly.  We will also place referral to psychiatry as above.  We will also be starting Effexor as above which should hopefully help some with his PTSD symptoms as well.  He will follow-up with me in a couple weeks via MyChart and we can adjust the dose of prazosin as needed. ? ? ?  ?Subjective:  ?HPI: ? ?Patient here for follow-up.  Last seen about 6 weeks ago.  Unfortunately since her last visit he has had a flareup of anxiety and PTSD.  He was recently fired from his job due to a PTSD flare and has had significant issues with financial stability since then.  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  He stopped taking the Zyprexa as he did not feel like it was helpful.  We sent a prescription for prazosin a couple weeks ago to help with his sleep disturbances which is helped a modest amount.  He is using clonazepam 1 mg nightly to help with sleep and anxiety.  This seems to be going well.  He is hesitant to use it more this due to concern for developing tolerance and dependency. ? ?   ?  ?Objective/Observations  ?Physical Exam: ?Gen: NAD, resting comfortably ?Pulm: Normal work of breathing ?Neuro: Grossly normal, moves all  extremities ?Psych: Normal affect and thought content ? ?Virtual Visit via Video  ? ?I connected with Omar Krause on 02/26/22 at  9:40 AM EDT by a video enabled telemedicine application and verified that I am speaking with the correct person using two identifiers. The limitations of evaluation and management by telemedicine and the availability of in person appointments were discussed. The patient expressed understanding and agreed to proceed.  ? ?Patient location: Home ?Provider location: [facility] ?Persons participating in the virtual visit: Myself and Patient ?   ? ?[name]. [name], MD ?02/26/2022 10:13 AM  ?

## 2022-02-26 NOTE — Assessment & Plan Note (Signed)
Not controlled.  He has not had much success with several SSRIs in the past.  Effexor worked modestly well in the past and he is willing to try this again.  We will start 75 mg daily.  He will check in with me in 1 to 2 weeks via MyChart and we can titrate the dose as tolerated.  We will also place referral for him to see psychiatrist. ?

## 2022-03-07 ENCOUNTER — Other Ambulatory Visit: Payer: Self-pay | Admitting: Physician Assistant

## 2022-03-12 ENCOUNTER — Telehealth: Payer: Self-pay | Admitting: Family Medicine
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
## 2022-03-13 NOTE — Telephone Encounter (Signed)
See note

## 2022-03-13 NOTE — Telephone Encounter (Signed)
Left message to return call to our office at their convenience and schedule an appointment with Dr [name]

## 2022-03-13 NOTE — Telephone Encounter (Signed)
Recommend office visit if needed.  [name]. [name], MD 03/13/2022 3:06 PM

## 2022-04-12 ENCOUNTER — Other Ambulatory Visit: Payer: Self-pay | Admitting: Family Medicine

## 2022-04-16 ENCOUNTER — Encounter: Payer: Self-pay | Admitting: Family Medicine

## 2022-04-16 ENCOUNTER — Telehealth: Payer: Self-pay | Admitting: Family Medicine

## 2022-04-16 NOTE — Telephone Encounter (Signed)
Left message to return call to our office at their convenience.  

## 2022-07-16 ENCOUNTER — Encounter: Payer: Self-pay | Admitting: *Deleted

## 2022-08-29 ENCOUNTER — Encounter: Payer: Self-pay | Admitting: Family Medicine

## 2022-08-30 NOTE — Telephone Encounter (Signed)
See note
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
## 2022-10-04 ENCOUNTER — Encounter: Payer: Self-pay | Admitting: *Deleted

## 2022-12-06 DIAGNOSIS — Y999 Unspecified external cause status: Secondary | ICD-10-CM | POA: Diagnosis not present

## 2022-12-06 DIAGNOSIS — M25512 Pain in left shoulder: Secondary | ICD-10-CM | POA: Diagnosis not present

## 2022-12-06 DIAGNOSIS — M25511 Pain in right shoulder: Secondary | ICD-10-CM | POA: Diagnosis not present

## 2022-12-06 DIAGNOSIS — S0081XA Abrasion of other part of head, initial encounter: Secondary | ICD-10-CM | POA: Diagnosis not present

## 2022-12-06 DIAGNOSIS — I10 Essential (primary) hypertension: Secondary | ICD-10-CM | POA: Diagnosis not present

## 2022-12-06 DIAGNOSIS — R0689 Other abnormalities of breathing: Secondary | ICD-10-CM | POA: Diagnosis not present

## 2022-12-06 DIAGNOSIS — S0083XA Contusion of other part of head, initial encounter: Secondary | ICD-10-CM | POA: Diagnosis not present

## 2022-12-06 DIAGNOSIS — R457 State of emotional shock and stress, unspecified: Secondary | ICD-10-CM | POA: Diagnosis not present

## 2022-12-06 DIAGNOSIS — Z23 Encounter for immunization: Secondary | ICD-10-CM | POA: Diagnosis not present

## 2022-12-07 ENCOUNTER — Telehealth: Payer: Self-pay | Admitting: Family Medicine

## 2022-12-07 ENCOUNTER — Telehealth (INDEPENDENT_AMBULATORY_CARE_PROVIDER_SITE_OTHER): Payer: 59 | Admitting: Family Medicine

## 2022-12-07 ENCOUNTER — Encounter: Payer: Self-pay | Admitting: Family Medicine

## 2022-12-07 VITALS — Ht 72.0 in | Wt 230.0 lb

## 2022-12-07 DIAGNOSIS — M5412 Radiculopathy, cervical region: Secondary | ICD-10-CM | POA: Diagnosis not present

## 2022-12-07 DIAGNOSIS — I10 Essential (primary) hypertension: Secondary | ICD-10-CM | POA: Diagnosis not present

## 2022-12-07 MED ORDER — PREDNISONE 50 MG PO TABS: ORAL_TABLET | ORAL | 0 refills | Status: DC

## 2022-12-07 MED ORDER — CYCLOBENZAPRINE HCL 10 MG PO TABS: 10.0000 mg | ORAL_TABLET | Freq: Three times a day (TID) | ORAL | 0 refills | Status: DC | PRN
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
## 2022-12-07 NOTE — Telephone Encounter (Signed)
See note

## 2022-12-07 NOTE — Telephone Encounter (Signed)
I think 5-7 days is a reasonable return date but he should let us know if his symptoms are not improving.  Algis Greenhouse. Jerline Pain, MD 12/07/2022 3:48 PM
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
## 2022-12-10 ENCOUNTER — Telehealth: Payer: Self-pay | Admitting: Family Medicine

## 2022-12-10 NOTE — Telephone Encounter (Signed)
Please see mychart message - ok to write work note.  Omar Krause. Jerline Pain, MD 12/10/2022 3:11 PM

## 2022-12-10 NOTE — Telephone Encounter (Signed)
Please see his phone note. I am ok with work note for 1-2 weeks. He should let us know if his symptoms are not improving.  Omar Krause. Jerline Pain, MD 12/10/2022 3:20 PM

## 2022-12-10 NOTE — Telephone Encounter (Signed)
Please advise 

## 2022-12-10 NOTE — Telephone Encounter (Signed)
Patient states they forgot to ask following ED follow visit, if PCP felt he should go back to work. Patient states they are not emotionally or physically ready to go back to work until at least 12/17/22. Please Advise.

## 2022-12-11 ENCOUNTER — Encounter: Payer: Self-pay | Admitting: Family Medicine

## 2022-12-11 NOTE — Telephone Encounter (Signed)
Letter done and send to pt via mychart

## 2022-12-14 ENCOUNTER — Encounter: Payer: Self-pay | Admitting: Family Medicine

## 2022-12-14 ENCOUNTER — Ambulatory Visit (INDEPENDENT_AMBULATORY_CARE_PROVIDER_SITE_OTHER): Payer: 59 | Admitting: Family Medicine

## 2022-12-14 VITALS — BP 175/102 | HR 80 | Temp 97.1°F | Ht 72.0 in | Wt 216.4 lb

## 2022-12-14 DIAGNOSIS — F4310 Post-traumatic stress disorder, unspecified: Secondary | ICD-10-CM

## 2022-12-14 DIAGNOSIS — R69 Illness, unspecified: Secondary | ICD-10-CM | POA: Diagnosis not present

## 2022-12-14 DIAGNOSIS — M5412 Radiculopathy, cervical region: Secondary | ICD-10-CM | POA: Diagnosis not present

## 2022-12-14 DIAGNOSIS — F411 Generalized anxiety disorder: Secondary | ICD-10-CM | POA: Diagnosis not present

## 2022-12-14 DIAGNOSIS — I10 Essential (primary) hypertension: Secondary | ICD-10-CM | POA: Diagnosis not present

## 2022-12-14 DIAGNOSIS — Z139 Encounter for screening, unspecified: Secondary | ICD-10-CM

## 2022-12-14 DIAGNOSIS — F321 Major depressive disorder, single episode, moderate: Secondary | ICD-10-CM

## 2022-12-14 MED ORDER — LORAZEPAM 1 MG PO TABS: 1.0000 mg | ORAL_TABLET | Freq: Three times a day (TID) | ORAL | 0 refills | Status: DC

## 2022-12-14 NOTE — Assessment & Plan Note (Signed)
Elevated PHQ today mostly due to situational incidences above.  No active SI or HI.  We did discuss restarting SSRI or SNRI however he declines for now as above.  He can follow-up with me in a week or so via MyChart.

## 2022-12-14 NOTE — Assessment & Plan Note (Signed)
Blood pressure is uncontrolled today.  Patient has not been able to get any of his medications due to not having a car transportation and he has been out for the week.  He will try to get his blood pressure meds today via [facility].  Does not currently have any signs of endorgan damage.  Hopefully he will be able to pick up medications today.  Will walk continue him on losartan-HCTZ 100-25 and propranolol 60 mg daily.  He can follow-up me on a week or so via MyChart.

## 2022-12-14 NOTE — Patient Instructions (Signed)
It was very nice to see you today!  Please complete lorazepam.  I will refer you to social work.  Will also refer you to sports medicine.  Please send me a message next week to let me know how you are doing.  Take care, Dr Jerline Pain  PLEASE NOTE:  If you had any lab tests, please let us know if you have not heard back within a few days. You may see your results on mychart before we have a chance to review them but we will give you a call once they are reviewed by [place].   If we ordered any referrals today, please let us know if you have not heard from their office within the next week.   If you had any urgent prescriptions sent in today, please check with the pharmacy within an hour of our visit to make sure the prescription was transmitted appropriately.   Please try these tips to maintain a healthy lifestyle:  Eat at least 3 REAL meals and 1-2 snacks per day.  Aim for no more than 5 hours between eating.  If you eat breakfast, please do so within one hour of getting up.   Each meal should contain half fruits/vegetables, one quarter protein, and one quarter carbs (no bigger than a computer mouse)  Cut down on sweet beverages. This includes juice, soda, and sweet tea.   Drink at least 1 glass of water with each meal and aim for at least 8 glasses per day  Exercise at least 150 minutes every week.
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
## 2022-12-19 NOTE — Progress Notes (Signed)
error 

## 2022-12-20 ENCOUNTER — Encounter: Payer: 59 | Admitting: Sports Medicine

## 2022-12-21 NOTE — Progress Notes (Signed)
This encounter was created in error - please disregard.

## 2022-12-27 NOTE — Progress Notes (Deleted)
    Omar Krause D.[place] Phone: [phone]   Assessment and Plan:     There are no diagnoses linked to this encounter.  ***   Pertinent previous records reviewed include ***   Follow Up: ***     Subjective:   I, Omar Krause, am serving as a Education administrator for Doctor Glennon Mac  Chief Complaint: neck pain   HPI:   12/28/2022 Patient is a 32 year old male complaining of neck pain. Patient states   Relevant Historical Information: ***  Additional pertinent review of systems negative.   Current Outpatient Medications:    cyclobenzaprine (FLEXERIL) 10 MG tablet, Take 1 tablet (10 mg total) by mouth 3 (three) times daily as needed for muscle spasms., Disp: 30 tablet, Rfl: 0   LORazepam (ATIVAN) 1 MG tablet, Take 1 tablet (1 mg total) by mouth every 8 (eight) hours., Disp: 30 tablet, Rfl: 0   losartan-hydrochlorothiazide (HYZAAR) 100-25 MG tablet, Take 1 tablet by mouth daily., Disp: 90 tablet, Rfl: 3   metoCLOPramide (REGLAN) 10 MG tablet, Take 1 tablet (10 mg total) by mouth every 6 (six) hours as needed for nausea., Disp: 30 tablet, Rfl: 0   Multiple Vitamin (MULTIVITAMIN WITH MINERALS) TABS tablet, Take 1 tablet by mouth daily., Disp: , Rfl:    pantoprazole (PROTONIX) 40 MG tablet, Take 1 tablet (40 mg total) by mouth daily., Disp: 30 tablet, Rfl: 3   propranolol ER (INDERAL LA) 60 MG 24 hr capsule, TAKE 1 CAPSULE BY MOUTH EVERY DAY, Disp: 90 capsule, Rfl: 3   Objective:     There were no vitals filed for this visit.    There is no height or weight on file to calculate BMI.    Physical Exam:    ***   Electronically signed by:  Omar Krause D.[facility] 12:41 PM 12/27/22

## 2022-12-28 ENCOUNTER — Encounter: Payer: Self-pay | Admitting: Family Medicine

## 2022-12-28 ENCOUNTER — Ambulatory Visit: Payer: 59 | Admitting: Sports Medicine

## 2022-12-28 ENCOUNTER — Other Ambulatory Visit: Payer: Self-pay

## 2022-12-28 DIAGNOSIS — M5412 Radiculopathy, cervical region: Secondary | ICD-10-CM

## 2023-01-10 ENCOUNTER — Other Ambulatory Visit: Payer: Self-pay | Admitting: Family Medicine

## 2023-01-11 NOTE — Telephone Encounter (Signed)
Last refill: 12/14/22 #30, 0 Last OV: 12/14/22 dx. Cervical radiculopathy, anxiety

## 2023-02-18 ENCOUNTER — Encounter: Payer: Self-pay | Admitting: Family Medicine
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
## 2023-02-19 NOTE — Telephone Encounter (Signed)
Please call or have triage call and check on him immediately. I do not see where he went to the hospital last night.  [name]. [name], MD 02/19/2023 7:37 AM
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
## 2023-02-19 NOTE — Telephone Encounter (Signed)
Noted. Thank you!  We need to make sure he is plugged in with the the mental health system. If he calls or messages [place] back please refer him to behavioral health and give him contact info to the behavioral health urgent care if needed.  [name]. [name], MD 02/19/2023 8:37 AM

## 2023-02-22 ENCOUNTER — Other Ambulatory Visit: Payer: Self-pay | Admitting: *Deleted

## 2023-02-22 NOTE — Telephone Encounter (Signed)
Patient returned call new phone number given and updated in patient chart [phone]  Patient agreed with referral for housing and food

## 2023-02-22 NOTE — Telephone Encounter (Signed)
Call patient at [phone] number not in service

## 2023-02-24 DIAGNOSIS — F411 Generalized anxiety disorder: Secondary | ICD-10-CM | POA: Diagnosis not present

## 2023-02-24 DIAGNOSIS — R45851 Suicidal ideations: Secondary | ICD-10-CM | POA: Diagnosis not present

## 2023-02-24 DIAGNOSIS — F332 Major depressive disorder, recurrent severe without psychotic features: Secondary | ICD-10-CM | POA: Diagnosis not present

## 2023-02-24 DIAGNOSIS — F1210 Cannabis abuse, uncomplicated: Secondary | ICD-10-CM | POA: Diagnosis not present

## 2023-02-24 DIAGNOSIS — Z1152 Encounter for screening for COVID-19: Secondary | ICD-10-CM | POA: Diagnosis not present

## 2023-02-24 DIAGNOSIS — I10 Essential (primary) hypertension: Secondary | ICD-10-CM | POA: Diagnosis not present

## 2023-02-25 ENCOUNTER — Other Ambulatory Visit: Payer: Self-pay | Admitting: *Deleted

## 2023-02-25 ENCOUNTER — Telehealth: Payer: Self-pay | Admitting: *Deleted

## 2023-02-25 ENCOUNTER — Telehealth: Payer: Self-pay | Admitting: Family Medicine

## 2023-02-25 DIAGNOSIS — F411 Generalized anxiety disorder: Secondary | ICD-10-CM | POA: Diagnosis not present

## 2023-02-25 DIAGNOSIS — F332 Major depressive disorder, recurrent severe without psychotic features: Secondary | ICD-10-CM | POA: Diagnosis not present

## 2023-02-25 DIAGNOSIS — F1210 Cannabis abuse, uncomplicated: Secondary | ICD-10-CM | POA: Diagnosis not present

## 2023-02-25 DIAGNOSIS — Z59819 Housing instability, housed unspecified: Secondary | ICD-10-CM

## 2023-02-25 NOTE — Progress Notes (Unsigned)
  Care Coordination  Outreach Note  02/25/2023 Name: Omar Krause MRN: [id number] DOB: 06/22/1991   Care Coordination Outreach Attempts: An unsuccessful telephone outreach was attempted today to offer the patient information about available care coordination services.  Follow Up Plan:  Additional outreach attempts will be made to offer the patient care coordination information and services.   Encounter Outcome:  No Answer  Burman Nieves, CCMA Care Coordination Care Guide Direct Dial: [phone]

## 2023-02-25 NOTE — Telephone Encounter (Signed)
Was seen in [facility] ED on 02/24/23.  Patient Name First: Omar Last: Krause Gender: Male DOB: 1991/03/19 Age: 32 Y 7 M 23 D Return Phone Number: [phone] (Secondary) Address: City/ State/ Zip: [place] [address] Client [facility] - Human resources officer [facility] [employer] Provider Omar Krause- MD Contact Type Call Who Is Calling Patient / Member / Family / Caregiver Call Type Triage / Clinical Relationship To Patient Self Return Phone Number [phone] (Secondary) Chief Complaint Anxiety and Panic Attack Reason for Call Request to Speak to a Physician Initial Comment Caller states he needs a record out his chart to get his emotional support animal approved. He is homeless and cannot get shelter with his dog until he had this letter so he is having a lot of anxiety at the moment. Translation No Nurse Assessment Nurse: Omar Fus, RN, Omar Krause Date/Time Omar Krause Time): 02/24/2023 11:52:56 AM Confirm and document reason for call. If symptomatic, describe symptoms. ---Caller states he needs a record out his chart to get his emotional support animal approved. He is homeless and cannot get shelter with his dog until he had this letter so he is having a lot of anxiety at the moment. He is needing access to a letter that was sent in 2021 for [employer] and he is needing this to be available on his patient portal. Does the patient have any new or worsening symptoms? ---Yes Will a triage be completed? ---Yes Related visit to physician within the last 2 weeks? ---No Does the PT have any chronic conditions? (i.e. diabetes, asthma, this includes High risk factors for pregnancy, etc.) ---Yes List chronic conditions. ---PTSD, MDD, anxiety, HTN Is this a behavioral health or substance abuse call? ---No Guidelines Guideline Title Affirmed Question Affirmed Notes Nurse Date/Time Omar Krause Time) Anxiety and Panic Attack [1]  Panic attack symptoms (diagnosed Omar Krause 02/24/2023 11:54:34 AM Guidelines Guideline Title Affirmed Question Affirmed Notes Nurse Date/Time (Eastern Time) in the past) AND [2] not better with usual treatment, reassurance, or Care Advice Disp. Time Omar Krause Time) Disposition Final User 02/24/2023 11:42:59 AM Attempt made - message left Omar Krause 02/24/2023 11:58:13 AM Go to ED Now (or PCP triage) Yes Omar Fus, RN, Omar Krause Final Disposition 02/24/2023 11:58:13 AM Go to ED Now (or PCP triage) Yes Omar Fus, RN, Gwynne Edinger Disagree/Comply Disagree Caller Understands Yes PreDisposition Did not know what to do Care Advice Given Per Guideline GO TO ED NOW (OR PCP TRIAGE): CARE ADVICE given per Anxiety and Panic Attack (Adult) guideline. ANOTHER ADULT SHOULD DRIVE: * It is better and safer if another adult drives instead of you. Comments User: Omar Gunner, RN Date/Time Omar Krause Time): 02/24/2023 11:59:46 AM Pt is homeless and would need to find transportation and have someone watch his dog to be able to get care. He is needing the letter from 2021 to get the papers for his dog to be an emotional support animal so that he can seek treatment and take his dog with him. Referrals GO TO FACILITY REFUSED

## 2023-02-25 NOTE — Telephone Encounter (Signed)
LVM for pt to call the office.

## 2023-02-26 DIAGNOSIS — I10 Essential (primary) hypertension: Secondary | ICD-10-CM | POA: Diagnosis not present

## 2023-02-26 DIAGNOSIS — F1210 Cannabis abuse, uncomplicated: Secondary | ICD-10-CM | POA: Diagnosis not present

## 2023-02-26 DIAGNOSIS — F332 Major depressive disorder, recurrent severe without psychotic features: Secondary | ICD-10-CM | POA: Diagnosis not present

## 2023-02-26 DIAGNOSIS — F411 Generalized anxiety disorder: Secondary | ICD-10-CM | POA: Diagnosis not present

## 2023-02-26 DIAGNOSIS — F17290 Nicotine dependence, other tobacco product, uncomplicated: Secondary | ICD-10-CM | POA: Diagnosis not present

## 2023-02-26 DIAGNOSIS — Z9151 Personal history of suicidal behavior: Secondary | ICD-10-CM | POA: Diagnosis not present

## 2023-02-26 DIAGNOSIS — F4310 Post-traumatic stress disorder, unspecified: Secondary | ICD-10-CM | POA: Diagnosis not present

## 2023-02-26 DIAGNOSIS — R45851 Suicidal ideations: Secondary | ICD-10-CM | POA: Diagnosis not present

## 2023-02-26 DIAGNOSIS — Z5900 Homelessness unspecified: Secondary | ICD-10-CM | POA: Diagnosis not present

## 2023-02-26 NOTE — Telephone Encounter (Signed)
Left message to return call to our office at their convenience.  

## 2023-02-27 NOTE — Telephone Encounter (Signed)
Left message to return call to our office at their convenience.  

## 2023-02-27 NOTE — Progress Notes (Unsigned)
  Care Coordination  Outreach Note  02/27/2023 Name: Farmer Kundinger III MRN: [id number] DOB: 07-Jun-1991   Care Coordination Outreach Attempts: A second unsuccessful outreach was attempted today to offer the patient with information about available care coordination services.  Follow Up Plan:  Additional outreach attempts will be made to offer the patient care coordination information and services.   Encounter Outcome:  No Answer  Burman Nieves, CCMA Care Coordination Care Guide Direct Dial: [phone]

## 2023-02-28 ENCOUNTER — Telehealth: Payer: Self-pay | Admitting: Licensed Clinical Social Worker

## 2023-02-28 NOTE — Patient Instructions (Signed)
Visit Information  Thank you for taking time to visit with me today. Please don't hesitate to contact me if I can be of assistance to you.   Following are the goals we discussed today:   Goals Addressed             This Visit's Progress    Obtain Supportive Resources-Housing   On track    Activities and task to complete in order to accomplish goals.   Continue with compliance of taking medication prescribed by Doctor Implement healthy coping skills discussed to assist with management of symptoms Continue working with [facility] care team to assist with goals identified         Please call the care guide team at [phone] if you need to cancel or reschedule your appointment.   If you are experiencing a Mental Health or Behavioral Health Crisis or need someone to talk to, please call the Suicide and Crisis Lifeline: 988 call 911   Patient verbalizes understanding of instructions and care plan provided today and agrees to view in MyChart. Active MyChart status and patient understanding of how to access instructions and care plan via MyChart confirmed with patient.     Jenel Lucks, MSW, LCSW [facility] Care Management [place]  [facility] [email or web address] Phone [phone] 10:57 PM

## 2023-02-28 NOTE — Patient Outreach (Signed)
  Care Coordination   Initial Visit Note   02/28/2023 Name: Omar Krause MRN: [id number] DOB: [birth year]  Omar Krause is a 32 y.o. year old male who sees Ardith Dark, MD for primary care. I spoke with  Omar Krause by phone today.  What matters to the patients health and wellness today?  Housing    Goals Addressed             This Visit's Progress    Obtain Supportive Resources-Housing   On track    Activities and task to complete in order to accomplish goals.   Continue with compliance of taking medication prescribed by Doctor Implement healthy coping skills discussed to assist with management of symptoms Continue working with [facility] care team to assist with goals identified         SDOH assessments and interventions completed:  No     Care Coordination Interventions:  Yes, provided  Interventions Today    Flowsheet Row Most Recent Value  Chronic Disease   Chronic disease during today's visit Hypertension (HTN), Other  [GAD, Depression, PTSD]  General Interventions   General Interventions Discussed/Reviewed General Interventions Discussed, [employer], Doctor Visits  [LCSW introduced self and explained role in care coordination. Psychosocial stressors identified. LCSW will search for community resources to assist with fostering emotional support animal while pt is hospitalized]  Mental Health Interventions   Mental Health Discussed/Reviewed Mental Health Discussed, Coping Strategies, Depression, Anxiety  [Pt is recieving tx for mental health managment. Triggers have been identified and strategies discussed to assist with symptom management]       Follow up plan: Follow up call scheduled for within 3 days    Encounter Outcome:  Pt. Visit Completed   Jenel Lucks, MSW, LCSW [facility] Care Management [facility] Health  [facility] [email or web address] Phone [phone] 10:56 PM

## 2023-02-28 NOTE — Progress Notes (Signed)
  Care Coordination  Outreach Note  02/28/2023 Name: Omar Krause MRN: [id number] DOB: [birth year]   Care Coordination Outreach Attempts: A third unsuccessful outreach was attempted today to offer the patient with information about available care coordination services.  Follow Up Plan:  No further outreach attempts will be made at this time. We have been unable to contact the patient to offer or enroll patient in care coordination services  Encounter Outcome:  No Answer  Burman Nieves, [facility] Care Coordination Care Guide Direct Dial: [phone]

## 2023-03-01 ENCOUNTER — Encounter: Payer: Self-pay | Admitting: Licensed Clinical Social Worker

## 2023-03-01 NOTE — Patient Outreach (Signed)
  Care Coordination   Follow Up/Collab Visit Note   03/01/2023 Name: Omar Krause MRN: [id number] DOB: [birth year]  Omar Krause is a 32 y.o. year old male who sees Ardith Dark, MD for primary care. I  collaborated with [facility] and [facility]  What matters to the patients health and wellness today?  Patient was not engaged during this encounter   SDOH assessments and interventions completed:  No     Care Coordination Interventions:  Yes, provided  Interventions Today    Flowsheet Row Most Recent Value  General Interventions   General Interventions Discussed/Reviewed Communication with  Communication with --  [employer] collaborated with [employer]. Requested staff to discuss with Director about extending boarding of pt's emotional pet, until d/c. LCSW attempted to provide update to patient,  however, he was not available. LCSW left message with nurse]       Follow up plan: Follow up call scheduled for 5/13    Encounter Outcome:  Pt. Visit Completed   Jenel Lucks, MSW, LCSW [facility] Care Management [facility] Health  [facility] [email or web address] Phone [phone] 6:58 PM

## 2023-03-04 ENCOUNTER — Telehealth: Payer: Self-pay | Admitting: Licensed Clinical Social Worker

## 2023-03-04 NOTE — Telephone Encounter (Signed)
Left message to return call to our office at their convenience.   Called patient multiple time, go straight to voice message, left message with out return call

## 2023-03-05 ENCOUNTER — Encounter: Payer: Self-pay | Admitting: Licensed Clinical Social Worker

## 2023-03-05 DIAGNOSIS — Z5900 Homelessness unspecified: Secondary | ICD-10-CM | POA: Diagnosis not present

## 2023-03-05 DIAGNOSIS — F17290 Nicotine dependence, other tobacco product, uncomplicated: Secondary | ICD-10-CM | POA: Diagnosis not present

## 2023-03-05 DIAGNOSIS — Z9151 Personal history of suicidal behavior: Secondary | ICD-10-CM | POA: Diagnosis not present

## 2023-03-05 DIAGNOSIS — F1210 Cannabis abuse, uncomplicated: Secondary | ICD-10-CM | POA: Diagnosis not present

## 2023-03-05 DIAGNOSIS — F411 Generalized anxiety disorder: Secondary | ICD-10-CM | POA: Diagnosis not present

## 2023-03-05 DIAGNOSIS — F4310 Post-traumatic stress disorder, unspecified: Secondary | ICD-10-CM | POA: Diagnosis not present

## 2023-03-05 DIAGNOSIS — I10 Essential (primary) hypertension: Secondary | ICD-10-CM | POA: Diagnosis not present

## 2023-03-05 DIAGNOSIS — R45851 Suicidal ideations: Secondary | ICD-10-CM | POA: Diagnosis not present

## 2023-03-05 DIAGNOSIS — F332 Major depressive disorder, recurrent severe without psychotic features: Secondary | ICD-10-CM | POA: Diagnosis not present

## 2023-03-05 NOTE — Patient Outreach (Signed)
  Care Coordination   Follow Up Visit Note   03/04/2023 Name: Jonus Rivenbark III MRN: [id number] DOB: 1991/01/15  [name] is a 32 y.o. year old male who sees Ardith Dark, MD for primary care. I spoke with  [name] by phone today.  What matters to the patients health and wellness today?  Support and [employer]    Goals Addressed             This Visit's Progress    Obtain Supportive Resources-Housing   On track    Activities and task to complete in order to accomplish goals.   Continue with compliance of taking medication prescribed by Doctor Implement healthy coping skills discussed to assist with management of symptoms Continue working with [facility] care team to assist with goals identified Schedule f/up appt with PCP after discharge         SDOH assessments and interventions completed:  No     Care Coordination Interventions:  Yes, provided  Interventions Today    Flowsheet Row Most Recent Value  Chronic Disease   Chronic disease during today's visit Hypertension (HTN), Other  [GAD, MDD, PTSD]  General Interventions   General Interventions Discussed/Reviewed General Interventions Reviewed, [employer], Communication with  [Update provided regarding animal shelter for emotional support animal]  Communication with --  [LCSW collaborated with Auburn Bilberry with community resources and discharge plan for patient]  Mental Health Interventions   Mental Health Discussed/Reviewed Mental Health Reviewed, Coping Strategies  [Support resources discussed]       Follow up plan: Follow up call scheduled for within a week    Encounter Outcome:  Pt. Visit Completed   Jenel Lucks, MSW, LCSW [facility] Care Management [facility] Health  [facility] [email or web address] Phone [phone] 12:09 PM

## 2023-03-05 NOTE — Patient Instructions (Signed)
Visit Information  Thank you for taking time to visit with me today. Please don't hesitate to contact me if I can be of assistance to you.   Following are the goals we discussed today:   Goals Addressed             This Visit's Progress    Obtain Supportive Resources-Housing   On track    Activities and task to complete in order to accomplish goals.   Continue with compliance of taking medication prescribed by Doctor Implement healthy coping skills discussed to assist with management of symptoms Continue working with [facility] care team to assist with goals identified Schedule f/up appt with PCP after discharge         Please call the care guide team at [phone] if you need to cancel or reschedule your appointment.   If you are experiencing a Mental Health or Behavioral Health Crisis or need someone to talk to, please call the Suicide and Crisis Lifeline: 988 call 911   Patient verbalizes understanding of instructions and care plan provided today and agrees to view in MyChart. Active MyChart status and patient understanding of how to access instructions and care plan via MyChart confirmed with patient.     Jenel Lucks, MSW, LCSW [facility] Care Management   [facility] [email or web address] Phone [phone] 12:10 PM

## 2023-03-06 NOTE — Patient Outreach (Signed)
  Care Coordination   Follow Up/Collab Visit Note   03/05/2023 Name: Omar Krause MRN: [id number] DOB: 07-22-1991  Omar Krause is a 32 y.o. year old male who sees Ardith Dark, MD for primary care. I  spoke with Auburn Bilberry, inpatient case manager at [facility]  What matters to the patients health and wellness today?  Discharge Planning   SDOH assessments and interventions completed:  No     Care Coordination Interventions:  Yes, provided  Interventions Today    Flowsheet Row Most Recent Value  Chronic Disease   Chronic disease during today's visit Hypertension (HTN), Other  [GAD, MDD, PTSD]  General Interventions   General Interventions Discussed/Reviewed Communication with  Communication with Social Work  [employer] collaborated with pt's inpatient case manager to assist with a safe discharge. LCSW obtained assistance from Care Guide Aid to provide letter regarding pt's emotional support animal, to provide to shelter]       Follow up plan: Follow up call scheduled for within a week    Encounter Outcome:  Pt. Visit Completed   Jenel Lucks, MSW, LCSW [facility] Care Management [facility] Health  [facility] [email or web address] Phone [phone] 1:57 PM

## 2023-03-07 ENCOUNTER — Telehealth: Payer: Self-pay | Admitting: Licensed Clinical Social Worker

## 2023-03-07 NOTE — Patient Instructions (Signed)
Visit Information  Thank you for taking time to visit with me today. Please don't hesitate to contact me if I can be of assistance to you.   Following are the goals we discussed today:   Goals Addressed             This Visit's Progress    Obtain Supportive Resources-Housing   On track    Activities and task to complete in order to accomplish goals.   Continue with compliance of taking medication prescribed by Doctor. Inform PCP of medication barriers at upcoming appt Implement healthy coping skills discussed to assist with management of symptoms. Inform PCP of counselor and psychiatrist referral Continue working with [facility] care team to assist with goals identified Complete disability application at ssa.gov        Please call the care guide team at [phone] if you need to cancel or reschedule your appointment.   If you are experiencing a Mental Health or Behavioral Health Crisis or need someone to talk to, please call the Suicide and Crisis Lifeline: 988 call 911   Patient verbalizes understanding of instructions and care plan provided today and agrees to view in MyChart. Active MyChart status and patient understanding of how to access instructions and care plan via MyChart confirmed with patient.     Jenel Lucks, MSW, LCSW [facility] Care Management [place]  [facility] [email or web address] Phone [phone] 6:47 PM

## 2023-03-07 NOTE — Patient Instructions (Signed)
Visit Information  Thank you for taking time to visit with me today. Please don't hesitate to contact me if I can be of assistance to you.   Following are the goals we discussed today:   Goals Addressed             This Visit's Progress    Obtain Supportive Resources-Housing   On track    Activities and task to complete in order to accomplish goals.   Continue with compliance of taking medication prescribed by Doctor Implement healthy coping skills discussed to assist with management of symptoms Continue working with [facility] care team to assist with goals identified Follow up with Lorina Rabon. Animal Shelter regarding how to obtain Chartered certified accountant about medication needs Complete disability application at ssa.gov         Please call the care guide team at [phone] if you need to cancel or reschedule your appointment.   If you are experiencing a Mental Health or Behavioral Health Crisis or need someone to talk to, please call the Suicide and Crisis Lifeline: 988 call 911   Patient verbalizes understanding of instructions and care plan provided today and agrees to view in MyChart. Active MyChart status and patient understanding of how to access instructions and care plan via MyChart confirmed with patient.     Jenel Lucks, MSW, LCSW [facility] Care Management [place]  [facility] [email or web address] Phone [phone] 3:37 PM

## 2023-03-07 NOTE — Patient Outreach (Signed)
  Care Coordination   Follow Up Visit Note   03/06/2023 Name: Omar Krause MRN: [id number] DOB: 06-12-1991  Omar Krause is a 32 y.o. year old male who sees Ardith Dark, MD for primary care. I spoke with  Omar Krause by phone today.  What matters to the patients health and wellness today?  Housing and Stress Management    Goals Addressed             This Visit's Progress    Obtain Supportive Resources-Housing   On track    Activities and task to complete in order to accomplish goals.   Continue with compliance of taking medication prescribed by Doctor Implement healthy coping skills discussed to assist with management of symptoms Continue working with [facility] care team to assist with goals identified Follow up with Lorina Rabon. Animal Shelter regarding how to obtain Chartered certified accountant about medication needs Complete disability application at ssa.gov         SDOH assessments and interventions completed:  No     Care Coordination Interventions:  Yes, provided  Interventions Today    Flowsheet Row Most Recent Value  Chronic Disease   Chronic disease during today's visit Hypertension (HTN), Other  [GAD, MDD, PTSD]  General Interventions   General Interventions Discussed/Reviewed General Interventions Reviewed, [employer] is residing at a [employer], endorses concerns that pet may not acclimate well in that environment. Pt will follow up with contacts to inquire about fostering pet while at shelter]  Mental Health Interventions   Mental Health Discussed/Reviewed Mental Health Reviewed, Coping Strategies, Anxiety, Depression  Pharmacy Interventions   Pharmacy Dicussed/Reviewed Pharmacy Topics Reviewed, Affording Medications  [Pt reports Medicaid was suspended for unknown reasons. States that he is unable to afford meds]       Follow up plan: Follow up call scheduled for 1 week    Encounter Outcome:  Pt. Visit Completed   Jenel Lucks, MSW,  LCSW [facility] Care Management [facility] Health  [facility] [email or web address] Phone [phone] 3:36 PM

## 2023-03-07 NOTE — Patient Outreach (Signed)
  Care Coordination   Follow Up Visit Note   03/07/2023 Name: Omar Krause MRN: [id number] DOB: 02-22-1991  Omar Krause is a 32 y.o. year old male who sees Ardith Dark, MD for primary care. I spoke with  Omar Krause by phone today.  What matters to the patients health and wellness today?  Symptom Management and Community Resources    Goals Addressed             This Visit's Progress    Obtain Supportive Resources-Housing   On track    Activities and task to complete in order to accomplish goals.   Continue with compliance of taking medication prescribed by Doctor. Inform PCP of medication barriers at upcoming appt Implement healthy coping skills discussed to assist with management of symptoms. Inform PCP of counselor and psychiatrist referral Continue working with [facility] care team to assist with goals identified Complete disability application at ssa.gov         SDOH assessments and interventions completed:  No     Care Coordination Interventions:  Yes, provided  Interventions Today    Flowsheet Row Most Recent Value  Chronic Disease   Chronic disease during today's visit Hypertension (HTN), Other  [GAD, MDD, PTSD]  General Interventions   General Interventions Discussed/Reviewed General Interventions Reviewed, [employer], Doctor Visits  [LCSW assisted pt in identifying options regarding emotional pet and utilizing current resources. Pt has obtained a 30 day bus pass. Will continue to stay at local shelter]  Doctor Visits Discussed/Reviewed Doctor Visits Reviewed  [Informed Pt of upcoming appt. Pt has no barriers getting to appt, [employer] provided Part Bus voucher]  Communication with RN, Social Work  [employer] collaborated with [employer] and RNCM regarding patient care needs during case review]  Mental Health Interventions   Mental Health Discussed/Reviewed Mental Health Reviewed, Coping Strategies, Anxiety, Depression, Grief and Loss  [LCSW provided safe  environment for pt to process overwhelming emotions due to ongoing stressors. Pt is aware of crisis intervention resources]  Pharmacy Interventions   Pharmacy Dicussed/Reviewed Pharmacy Topics Reviewed, Medication Adherence, Affording Medications  [Pt obtained five medications today. States he will discuss with PCP barriers to obtaining a medication]       Follow up plan: Follow up call scheduled for 1-2 weeks    Encounter Outcome:  Pt. Visit Completed   Jenel Lucks, MSW, LCSW [facility] Care Management [facility] Health  [facility] [email or web address] Phone [phone] 6:47 PM

## 2023-03-08 ENCOUNTER — Ambulatory Visit: Payer: 59 | Admitting: Family Medicine

## 2023-03-08 ENCOUNTER — Telehealth: Payer: Self-pay | Admitting: Licensed Clinical Social Worker

## 2023-03-08 DIAGNOSIS — F411 Generalized anxiety disorder: Secondary | ICD-10-CM

## 2023-03-08 DIAGNOSIS — I10 Essential (primary) hypertension: Secondary | ICD-10-CM

## 2023-03-08 NOTE — Patient Instructions (Signed)
Visit Information  Thank you for taking time to visit with me today. Please don't hesitate to contact me if I can be of assistance to you.   Following are the goals we discussed today:   Goals Addressed             This Visit's Progress    Obtain Supportive Resources-Housing   On track    Activities and task to complete in order to accomplish goals.   Contact PCP office to re-schedule hospital f/up appt Continue with compliance of taking medication prescribed by Doctor. Inform PCP of medication barriers at upcoming appt Implement healthy coping skills discussed to assist with management of symptoms. Inform PCP of counselor and psychiatrist referral Continue working with [facility] care team to assist with goals identified Complete disability application at ssa.gov         Please call the care guide team at [phone] if you need to cancel or reschedule your appointment.   If you are experiencing a Mental Health or Behavioral Health Crisis or need someone to talk to, please call the Suicide and Crisis Lifeline: 988 call 911   Patient verbalizes understanding of instructions and care plan provided today and agrees to view in MyChart. Active MyChart status and patient understanding of how to access instructions and care plan via MyChart confirmed with patient.     Jenel Lucks, MSW, LCSW [facility] Care Management [place]  [facility] [email or web address] Phone [phone] 10:26 AM

## 2023-03-08 NOTE — Patient Outreach (Signed)
  Care Coordination   Follow Up Visit Note   03/08/2023 Name: Omar Krause MRN: [id number] DOB: Apr 24, 1991  Omar Krause is a 32 y.o. year old male who sees Ardith Dark, MD for primary care. I spoke with  Omar Krause by phone today.  What matters to the patients health and wellness today?  Symptom Management/Stress Management/Transportation    Goals Addressed             This Visit's Progress    Obtain Supportive Resources-Housing   On track    Activities and task to complete in order to accomplish goals.   Contact PCP office to re-schedule hospital f/up appt Continue with compliance of taking medication prescribed by Doctor. Inform PCP of medication barriers at upcoming appt Implement healthy coping skills discussed to assist with management of symptoms. Inform PCP of counselor and psychiatrist referral Continue working with [facility] care team to assist with goals identified Complete disability application at ssa.gov         SDOH assessments and interventions completed:  No     Care Coordination Interventions:  Yes, provided   Follow up plan: Follow up call scheduled for 1 week    Encounter Outcome:  Pt. Visit Completed   Jenel Lucks, MSW, LCSW [facility] Care Management [facility] Health  [facility] [email or web address] Phone [phone] 10:26 AM

## 2023-03-11 ENCOUNTER — Telehealth: Payer: Self-pay | Admitting: *Deleted

## 2023-03-11 NOTE — Telephone Encounter (Signed)
   Telephone encounter was:  Unsuccessful.  03/11/2023 Name: Omar Krause MRN: [id number] DOB: 09-07-1991  Unsuccessful outbound call made today to assist with:  Transportation Needs   Outreach Attempt:  1st Attempt  A HIPAA compliant voice message was left requesting a return call.  Instructed patient to call back at [phone].  Yehuda Mao Greenauer -[facility] [place], Population Health [phone] [address] [place] , [place] [address] Email : [email or web address]

## 2023-03-11 NOTE — Telephone Encounter (Signed)
   Telephone encounter was:  Successful.  03/11/2023 Name: Omar Krause MRN: [id number] DOB: [birth year]  Omar Krause is a 32 y.o. year old male who is a primary care patient of Ardith Dark, MD . The community resource team was consulted for assistance with Transportation Needs   Care guide performed the following interventions: Patient provided with information about care guide support team and interviewed to confirm resource needs.  Follow Up Plan:  Client will assess whether he can ride through [place] and call me back  Alois Cliche -[facility] [place], Population Health [phone] [address] [place] , [place] [address] Email : [email or web address]

## 2023-03-12 ENCOUNTER — Telehealth: Payer: Self-pay | Admitting: *Deleted

## 2023-03-12 ENCOUNTER — Encounter: Payer: Self-pay | Admitting: Licensed Clinical Social Worker

## 2023-03-12 NOTE — Telephone Encounter (Signed)
Telephone encounter was:  Successful.  03/12/2023 Name: Omar Krause MRN: [id number] DOB: 03-19-1991  Omar Krause is a 32 y.o. year old male who is a primary care patient of Ardith Dark, MD . The community resource team was consulted for assistance with Transportation Needs   Care guide performed the following interventions: Patient provided with information about care guide support team and interviewed to confirm resource needs. Patient in shelter  Transportation will pick him up at address [address] [place] [address]  Patient verified insurance benefits that doesn't cover [employer] provided thn transportation concierge line at [phone]  Follow Up Plan:  No further follow up planned at this time. The patient has been provided with needed resources.  Omar Mao Greenauer -[facility] [place], Population Health [phone] [address] [place] , [place] [address] Email : Omar Mao. Krause @[email or web address] .Omar Krause DOB: 1991/08/22 MRN: [id number]   RIDER WAIVER AND RELEASE OF LIABILITY  For purposes of improving physical access to our facilities, Omar Krause is pleased to partner with third parties to provide [place] patients or other authorized individuals the option of convenient, on-demand ground transportation services (the [employer]") through use of the technology service that enables users to request on-demand ground transportation from independent third-party providers.  By opting to use and accept these [employer], I, the undersigned, hereby agree on behalf of myself, and on behalf of any minor child using the Science writer for whom I am the parent or legal guardian, as follows:  Science writer provided to me are provided by independent third-party transportation providers who are not [employer] or employees and who are unaffiliated with [employer]. [place] is neither a transportation carrier nor a  common or public carrier. [place] has no control over the quality or safety of the transportation that occurs as a result of the [employer]. [place] cannot guarantee that any third-party transportation provider will complete any arranged transportation service. [facility] [facility] makes no representation, warranty, or guarantee regarding the reliability, timeliness, quality, safety, suitability, or availability of any of the Transport Services or that they will be error free. I fully understand that traveling by vehicle involves risks and dangers of serious bodily injury, including permanent disability, paralysis, and death. I agree, on behalf of myself and on behalf of any minor child using the Transport Services for whom I am the parent or legal guardian, that the entire risk arising out of my use of the [employer] remains solely with me, to the maximum extent permitted under applicable law. The [employer] are provided "as is" and "as available." [place] disclaims all representations and warranties, express, implied or statutory, not expressly set out in these terms, including the implied warranties of merchantability and fitness for a particular purpose. I hereby waive and release Omar Krause, its agents, employees, officers, directors, representatives, insurers, attorneys, assigns, successors, subsidiaries, and affiliates from any and all past, present, or future claims, demands, liabilities, actions, causes of action, or suits of any kind directly or indirectly arising from acceptance and use of the [employer]. I further waive and release [place] and its affiliates from all present and future liability and responsibility for any injury or death to persons or damages to property caused by or related to the use of the [employer]. I have read this Waiver and Release of Liability, and I understand the terms used in it and  their legal significance. This Waiver  is freely and voluntarily given with the understanding that my right (as well as the right of any minor child for whom I am the parent or legal guardian using the [employer]) to legal recourse against [place] in connection with the [employer] is knowingly surrendered in return for use of these services.   I attest that I read the consent document to Omar Krause, gave Omar Krause the opportunity to ask questions and answered the questions asked (if any). I affirm that Omar Krause then provided consent for he's participation in this program.     Omar Krause   Transportation Exception Form  * If the practice/clinic is more than 25 miles from the patient's address, please provide the following information:    Patients Name Omar Krause Pick Up Date   Patient DOB 05/06/1991 Practice Location [facility]   Patient MRN [id number]    Patient Home Address [address] [place] [address] Patient at shelter in [place]      Description of Clinical Conditions Necessitating Transport    [facility] referral needed to get to Occupational hygienist of Requester: Omar Krause  DATE:  03/12/2023

## 2023-03-12 NOTE — Patient Outreach (Signed)
  Care Coordination   Follow Up Visit Note   03/11/2023 Name: Omar Krause MRN: [id number] DOB: [birth year]  Omar Krause is a 32 y.o. year old male who sees Ardith Dark, MD for primary care. I spoke with  Omar Krause by phone today.  What matters to the patients health and wellness today?  Medicaid    Goals Addressed             This Visit's Progress    Obtain Supportive Resources-Housing   On track    Activities and task to complete in order to accomplish goals.   Visit local DSS office to re-apply for Medicaid benefits Continue with compliance of taking medication prescribed by Doctor. Inform PCP of medication barriers at upcoming appt Implement healthy coping skills discussed to assist with management of symptoms. Inform PCP of counselor and psychiatrist referral Continue working with [facility] care team to assist with goals identified Complete disability application at ssa.gov         SDOH assessments and interventions completed:  No     Care Coordination Interventions:  Yes, provided  Interventions Today    Flowsheet Row Most Recent Value  Chronic Disease   Chronic disease during today's visit Hypertension (HTN)  General Interventions   General Interventions Discussed/Reviewed Communication with, General Interventions Reviewed  Communication with --  [LCSW contacted Medicaid to verify benefits. Updated patient that benefits expired on 12/20/22 due to not re-certifying/making a payment. Options to re-appy discussed. Pt agreed to visit local DSS office]       Follow up plan:    Encounter Outcome:  Pt. Visit Completed   Jenel Lucks, MSW, LCSW [facility] Care Management [facility] Health  [facility] [email or web address] Phone [phone] 4:37 PM

## 2023-03-12 NOTE — Patient Outreach (Signed)
  Care Coordination   Follow Up/Collab Visit Note   03/12/2023 Name: Omar Krause MRN: [id number] DOB: Sep 01, 1991  Omar Krause is a 32 y.o. year old male who sees Omar Dark, MD for primary care. I  engaged with [facility] Concierge   What matters to the patients health and wellness today?  Transportation     SDOH assessments and interventions completed:  No     Care Coordination Interventions:  Yes, provided  Interventions Today    Flowsheet Row Most Recent Value  General Interventions   General Interventions Discussed/Reviewed Communication with  Communication with --  [[facility] Concierge line to request transportation to PCP appt on 5/24]       Follow up plan: Follow up call scheduled for within the week    Encounter Outcome:  Pt. Visit Completed   Omar Krause, MSW, LCSW [facility] Care Management [facility] Health  [facility] [email or web address] Phone [phone] 4:22 PM

## 2023-03-13 ENCOUNTER — Telehealth: Payer: Self-pay | Admitting: *Deleted

## 2023-03-13 NOTE — Telephone Encounter (Signed)
   Telephone encounter was:  Unsuccessful.  03/13/2023 Name: Omar Krause MRN: [id number] DOB: [birth year]  Unsuccessful outbound call made today to assist with:  Transportation Needs   Outreach Attempt:  1st Attempt [employer] is full , trying to reach pt to remind transportation is already scheduled for PCP appt by careguide no need to call the concierge line unless he has additional appts  Alois Cliche -[facility] [place], Population Health [phone] [address] [place] , [place] [address] Email : Yehuda [email or web address]

## 2023-03-14 ENCOUNTER — Telehealth: Payer: Self-pay | Admitting: Licensed Clinical Social Worker

## 2023-03-14 NOTE — Patient Instructions (Signed)
Visit Information  Thank you for taking time to visit with me today. Please don't hesitate to contact me if I can be of assistance to you.   Following are the goals we discussed today:   Goals Addressed             This Visit's Progress    Obtain Supportive Resources-Housing   On track    Activities and task to complete in order to accomplish goals.   Visit local DSS office to re-apply for Medicaid benefits Continue with compliance of taking medication prescribed by Doctor. Inform PCP of medication barriers at upcoming appt Implement healthy coping skills discussed to assist with management of symptoms. Inform PCP of counselor and psychiatrist referral Continue working with [facility] care team to assist with goals identified Complete disability application at ssa.gov         Please call the care guide team at [phone] if you need to cancel or reschedule your appointment.   If you are experiencing a Mental Health or Behavioral Health Crisis or need someone to talk to, please call the Suicide and Crisis Lifeline: 988 call 911   Patient verbalizes understanding of instructions and care plan provided today and agrees to view in MyChart. Active MyChart status and patient understanding of how to access instructions and care plan via MyChart confirmed with patient.     Jenel Lucks, MSW, LCSW [facility] Care Management [place]  [facility] [email or web address] Phone [phone] 6:51 PM

## 2023-03-14 NOTE — Patient Outreach (Signed)
  Care Coordination   Follow Up Visit Note   03/14/2023 Name: Omar Krause MRN: [id number] DOB: 23-May-1991  Omar Krause is a 32 y.o. year old male who sees Ardith Dark, MD for primary care. I spoke with  Omar Krause by phone today.  What matters to the patients health and wellness today?  Transportation    Goals Addressed             This Visit's Progress    Obtain Supportive Resources-Housing   On track    Activities and task to complete in order to accomplish goals.   Visit local DSS office to re-apply for Medicaid benefits Continue with compliance of taking medication prescribed by Doctor. Inform PCP of medication barriers at upcoming appt Implement healthy coping skills discussed to assist with management of symptoms. Inform PCP of counselor and psychiatrist referral Continue working with [facility] care team to assist with goals identified Complete disability application at ssa.gov         SDOH assessments and interventions completed:  No     Care Coordination Interventions:  Yes, provided  Interventions Today    Flowsheet Row Most Recent Value  Chronic Disease   Chronic disease during today's visit Hypertension (HTN)  General Interventions   General Interventions Discussed/Reviewed [employer], Communication with  [Pt's phone has broken and is not in service. LCSW confirmed that pt has transportation scheduled and discussed how he can request for a return ride]  Communication with --  [LCSW collaborated with care guide]  Mental Health Interventions   Mental Health Discussed/Reviewed Mental Health Reviewed, Coping Strategies       Follow up plan: Follow up call scheduled for within a week    Encounter Outcome:  Pt. Visit Completed   Jenel Lucks, MSW, LCSW [facility] Care Management [facility] Health  [facility] [email or web address] Phone [phone] 6:50 PM

## 2023-03-15 ENCOUNTER — Ambulatory Visit: Payer: 59 | Admitting: Family Medicine

## 2023-03-15 ENCOUNTER — Telehealth: Payer: Self-pay | Admitting: *Deleted

## 2023-03-15 NOTE — Telephone Encounter (Signed)
   Telephone encounter was:  Unsuccessful.  03/15/2023 Name: Omar Krause MRN: [id number] DOB: [birth year]  Unsuccessful outbound call made today to assist with:  Transportation Needs  Tried to reach out to patient as cannot schedule Benedetto Goad ride as we did without access to a phone , reached out to LCSW to see if another way to reach patient will also contact the office   Alois Cliche -[facility] [place], Population Health [phone] [address] [place] , [place] [address] Email : [email or web address]

## 2023-03-21 ENCOUNTER — Ambulatory Visit: Payer: 59 | Admitting: Family Medicine

## 2023-03-21 ENCOUNTER — Telehealth: Payer: Self-pay | Admitting: Family Medicine

## 2023-03-21 NOTE — Telephone Encounter (Addendum)
Caller is requesting TOC on patient's behalf from Dr. [name] to [name]. [place]. Is this okay with you?  Omar Krause can be reached either @ [phone] (this is the whole number;direct line) or [phone].

## 2023-03-21 NOTE — Telephone Encounter (Signed)
See PCP note.

## 2023-03-21 NOTE — Telephone Encounter (Signed)
Ok with me.  Omar Krause. [name], MD 03/21/2023 11:18 AM

## 2023-03-25 ENCOUNTER — Telehealth: Payer: Self-pay | Admitting: Family Medicine

## 2023-03-25 NOTE — Telephone Encounter (Signed)
Patient states he has changed his mind and wants to stay with Dr. [name]

## 2023-03-25 NOTE — Telephone Encounter (Signed)
Caller states patient is in need of an updated ESA letter stating that his new pet, [place], serves as his emotional support animal. States they need this letter to be able to have pt within facility.   Last letter was done in 05-Apr-2020 with now deceased animal, Lolly.

## 2023-03-26 NOTE — Telephone Encounter (Signed)
Left message to return call to our office at their convenience.  Patient need a office visit please give [place] a all to schedule OV

## 2023-03-27 ENCOUNTER — Encounter: Payer: Self-pay | Admitting: Licensed Clinical Social Worker

## 2023-03-28 ENCOUNTER — Encounter: Payer: Self-pay | Admitting: Licensed Clinical Social Worker

## 2023-03-28 NOTE — Patient Outreach (Signed)
  Care Coordination   Follow Up Visit Note   03/27/2023 Name: Jervonte Krauth III MRN: [id number] DOB: June 23, 1991  [name] is a 32 y.o. year old male who sees Ardith Dark, MD for primary care. I  spoke with shelter staff, in an attempt to engage patient. Left detailed message with staff to provide to pt  What matters to the patients health and wellness today?  Pt was not engaged during this encounter    SDOH assessments and interventions completed:  No     Care Coordination Interventions:  Yes, provided   Follow up plan:  LCSW will await a return call from pt    Encounter Outcome:  Pt. Visit Completed   Jenel Lucks, MSW, LCSW [facility] Care Management [facility] Health  [facility] [email or web address] Phone [phone] 6:03 PM

## 2023-03-29 NOTE — Patient Outreach (Signed)
  Care Coordination   Multidisciplinary Case Review Note    03/28/2023 Name: Omar Krause MRN: [id number] DOB: May 16, 1991  Omar Krause is a 32 y.o. year old male who sees Ardith Dark, MD for primary care.  The  multidisciplinary care team met today to review patient care needs and barriers.    SDOH assessments and interventions completed:  No     Care Coordination Interventions Activated:  Yes   Care Coordination Interventions:  Yes, provided  Interventions Today    Flowsheet Row Most Recent Value  Chronic Disease   Chronic disease during today's visit Hypertension (HTN), Other  [PTSD, Depression]  General Interventions   General Interventions Discussed/Reviewed Communication with  Communication with RN, Social Work       Follow up plan:  LCSW will make additional attempts to contact patient    Multidisciplinary Team Attendees:   Bevelyn Ngo, BSW Jenel Lucks, LCSW Antionette Fairy, RN Fleeta Emmer, RN  Scribe for Multidisciplinary Case Review:   Jenel Lucks, MSW, LCSW [facility] Care Management [facility] Health  [facility] [email or web address] Phone [phone] 5:20 AM

## 2023-04-01 ENCOUNTER — Telehealth: Payer: Self-pay | Admitting: Licensed Clinical Social Worker

## 2023-04-01 ENCOUNTER — Encounter: Payer: Self-pay | Admitting: Licensed Clinical Social Worker

## 2023-04-01 NOTE — Patient Instructions (Signed)
Visit Information  Thank you for taking time to visit with me today. Please don't hesitate to contact me if I can be of assistance to you.   Following are the goals we discussed today:   Goals Addressed             This Visit's Progress    Obtain Supportive Resources-Housing   On track    Activities and task to complete in order to accomplish goals.   Visit local DSS office to inquire about Medicaid benefits Continue with compliance of taking medication prescribed by Doctor Implement healthy coping skills discussed to assist with management of symptoms. Inform PCP of counselor and psychiatrist referral Continue working with [facility] care team to assist with goals identified Complete disability application at ssa.gov         Please call the care guide team at [phone] if you need to cancel or reschedule your appointment.   If you are experiencing a Mental Health or Behavioral Health Crisis or need someone to talk to, please call the Suicide and Crisis Lifeline: 988 call 911   Patient verbalizes understanding of instructions and care plan provided today and agrees to view in MyChart. Active MyChart status and patient understanding of how to access instructions and care plan via MyChart confirmed with patient.     Jenel Lucks, MSW, LCSW [facility] Care Management [place]  [facility] [email or web address] Phone [phone] 11:43 PM

## 2023-04-01 NOTE — Patient Outreach (Signed)
  Care Coordination   Follow Up Visit Note   03/29/2023 Name: Omar Krause MRN: [id number] DOB: [birth year]  Omar Krause is a 32 y.o. year old male who sees Ardith Dark, MD for primary care. I spoke with  Omar Krause by phone today.  What matters to the patients health and wellness today?  Transportation, DSS, Benefits    Goals Addressed             This Visit's Progress    Obtain Supportive Resources-Housing   On track    Activities and task to complete in order to accomplish goals.   Visit local DSS office to inquire about Medicaid benefits Continue with compliance of taking medication prescribed by Doctor Implement healthy coping skills discussed to assist with management of symptoms. Inform PCP of counselor and psychiatrist referral Continue working with [facility] care team to assist with goals identified Complete disability application at ssa.gov         SDOH assessments and interventions completed:  No     Care Coordination Interventions:  Yes, provided  Interventions Today    Flowsheet Row Most Recent Value  Chronic Disease   Chronic disease during today's visit Hypertension (HTN), Other  [Depression]  General Interventions   General Interventions Discussed/Reviewed General Interventions Reviewed, Doctor Visits  [Pt's emotional support animal is currently residing with him at the shelter. Case worker plans to assist pt with housing through rapid re-housing program. Barriers to f/up with Medicaid identified, LCSW strongly encouraged him to visit local office.]  Doctor Visits Discussed/Reviewed PCP  [Pt agreed to re-schedule f/up appt with PCP. Will let LCSW know when appt is made, to assist with transportation]  Mental Health Interventions   Mental Health Discussed/Reviewed Mental Health Reviewed, Depression  [Pt has transportation locally and is working with shelter on obtaining a new phone. Feels safe at shelter]  Safety Interventions   Safety  Discussed/Reviewed Safety Discussed       Follow up plan: Follow up call scheduled for 2 weeks    Encounter Outcome:  Pt. Visit Completed   Jenel Lucks, MSW, LCSW [facility] Care Management [facility] Health  [facility] [email or web address] Phone [phone] 11:42 PM

## 2023-04-03 ENCOUNTER — Encounter: Payer: Self-pay | Admitting: Licensed Clinical Social Worker

## 2023-04-03 NOTE — Patient Outreach (Signed)
  Care Coordination   Follow Up Visit Note   04/01/2023 Name: Omar Krause MRN: [id number] DOB: 1991-01-30  Omar Krause is a 32 y.o. year old male who sees Ardith Dark, MD for primary care. I spoke with  Omar Krause by phone today.  What matters to the patients health and wellness today?  Strengthening support system    Goals Addressed             This Visit's Progress    Obtain Supportive Resources-Housing   On track    Activities and task to complete in order to accomplish goals.   Visit local DSS office to inquire about Medicaid benefits Continue with compliance of taking medication prescribed by Doctor Implement healthy coping skills discussed to assist with management of symptoms. Inform PCP of counselor and psychiatrist referral Continue working with [facility] care team to assist with goals identified Complete disability application at ssa.gov         SDOH assessments and interventions completed:  No     Care Coordination Interventions:  Yes, provided  Interventions Today    Flowsheet Row Most Recent Value  General Interventions   General Interventions Discussed/Reviewed General Interventions Reviewed  [Pt provided new email address and contact information for shelter case manager, Wynona Luna, who can be reached at the center ext. [phone], or her direct line at [phone]]  Mental Health Interventions   Mental Health Discussed/Reviewed Other  [Pt is interested in having emotional support animal become trained as a service animal]       Follow up plan: Follow up call scheduled for 1-2 weeks    Encounter Outcome:  Pt. Visit Completed   Jenel Lucks, MSW, LCSW [facility] Care Management [facility] Health  [facility] [email or web address] Phone [phone] 8:36 AM

## 2023-04-03 NOTE — Patient Instructions (Signed)
Visit Information  Thank you for taking time to visit with me today. Please don't hesitate to contact me if I can be of assistance to you.   Following are the goals we discussed today:   Goals Addressed             This Visit's Progress    Obtain Supportive Resources-Housing   On track    Activities and task to complete in order to accomplish goals.   Visit local DSS office to inquire about Medicaid benefits Continue with compliance of taking medication prescribed by Doctor Implement healthy coping skills discussed to assist with management of symptoms. Inform PCP of counselor and psychiatrist referral Continue working with [facility] care team to assist with goals identified Complete disability application at ssa.gov         Please call the care guide team at [phone] if you need to cancel or reschedule your appointment.   If you are experiencing a Mental Health or Behavioral Health Crisis or need someone to talk to, please call the Suicide and Crisis Lifeline: 988 call 911   Patient verbalizes understanding of instructions and care plan provided today and agrees to view in MyChart. Active MyChart status and patient understanding of how to access instructions and care plan via MyChart confirmed with patient.     Jenel Lucks, MSW, LCSW [facility] Care Management [place]  [facility] [email or web address] Phone [phone] 8:36 AM

## 2023-04-04 NOTE — Patient Outreach (Signed)
  Care Coordination   Follow Up Visit Note   04/03/2023 Name: Omar Krause MRN: [id number] DOB: 1991-03-10  Omar Krause is a 32 y.o. year old male who sees Ardith Dark, MD for primary care. I  spoke with pt's friend  What matters to the patients health and wellness today?  Patient would like to f/up with LCSW; however, does not have an individual phone. LCSW encouraged pt's friend to have him call her when available    SDOH assessments and interventions completed:  No     Care Coordination Interventions:  Yes, provided   Follow up plan:  Pt will f/up with LCSW, when available    Encounter Outcome:  Pt. Visit Completed   Jenel Lucks, MSW, LCSW [facility] Care Management [facility] Health  [facility] [email or web address] Phone [phone] 6:21 PM

## 2023-04-05 ENCOUNTER — Telehealth: Payer: Self-pay | Admitting: Licensed Clinical Social Worker

## 2023-04-05 NOTE — Patient Instructions (Signed)
Visit Information  Thank you for taking time to visit with me today. Please don't hesitate to contact me if I can be of assistance to you.   Following are the goals we discussed today:   Goals Addressed             This Visit's Progress    Obtain Supportive Resources-Housing   On track    Activities and task to complete in order to accomplish goals.   Visit local DSS office to inquire about Medicaid benefits Continue with compliance of taking medication prescribed by Doctor Implement healthy coping skills discussed to assist with management of symptoms. Inform PCP of counselor and psychiatrist referral Continue working with [facility] care team to assist with goals identified Complete disability application at ssa.gov F/up with [facility] to assist with emergency housing needs         Please call the care guide team at [phone] if you need to cancel or reschedule your appointment.   If you are experiencing a Mental Health or Behavioral Health Crisis or need someone to talk to, please call the Suicide and Crisis Lifeline: 988 call 911   Patient verbalizes understanding of instructions and care plan provided today and agrees to view in MyChart. Active MyChart status and patient understanding of how to access instructions and care plan via MyChart confirmed with patient.     Jenel Lucks, MSW, LCSW [facility] Care Management [place]  [facility] [email or web address] Phone [phone] 6:31 AM

## 2023-04-05 NOTE — Patient Outreach (Signed)
  Care Coordination   Follow Up Visit Note   04/04/2023 Name: Omar Krause MRN: [id number] DOB: [birth year]  Omar Krause is a 32 y.o. year old male who sees Omar Dark, MD for primary care. I spoke with  Omar Krause by phone today.  What matters to the patients health and wellness today?  Housing    Goals Addressed             This Visit's Progress    Obtain Supportive Resources-Housing   On track    Activities and task to complete in order to accomplish goals.   Visit local DSS office to inquire about Medicaid benefits Continue with compliance of taking medication prescribed by Doctor Implement healthy coping skills discussed to assist with management of symptoms. Inform PCP of counselor and psychiatrist referral Continue working with [facility] care team to assist with goals identified Complete disability application at ssa.gov F/up with [facility] to assist with emergency housing needs         SDOH assessments and interventions completed:  No     Care Coordination Interventions:  Yes, provided  Interventions Today    Flowsheet Row Most Recent Value  Chronic Disease   Chronic disease during today's visit Hypertension (HTN), Other  [MDD, PTSD]  General Interventions   General Interventions Discussed/Reviewed [employer] was asked to leave previous shelter. He is in the process of obtaining emergency housing. LCSW provided him letter regarding emotional support animal, from PCP via email]  Mental Health Interventions   Mental Health Discussed/Reviewed Mental Health Reviewed, Coping Strategies, Anxiety, Depression  Safety Interventions   Safety Discussed/Reviewed Safety Reviewed       Follow up plan: Follow up call scheduled for 1-2 weeks    Encounter Outcome:  Pt. Visit Completed   Omar Krause, MSW, LCSW [facility] Care Management [facility] Health  [facility] [email or web address] Phone [phone] 6:31 AM

## 2023-04-08 ENCOUNTER — Encounter: Payer: Self-pay | Admitting: Licensed Clinical Social Worker

## 2023-04-08 NOTE — Patient Instructions (Signed)
Visit Information  Thank you for taking time to visit with me today. Please don't hesitate to contact me if I can be of assistance to you.   Following are the goals we discussed today:   Goals Addressed             This Visit's Progress    Obtain Supportive Resources-Housing   On track    Activities and task to complete in order to accomplish goals.   Visit local DSS office to inquire about Medicaid benefits Continue with compliance of taking medication prescribed by Doctor Implement healthy coping skills discussed to assist with management of symptoms. Inform PCP of counselor and psychiatrist referral Continue working with [facility] care team to assist with goals identified Complete disability application at ssa.gov F/up with [facility] to assist with emergency housing needs         Please call the care guide team at [phone] if you need to cancel or reschedule your appointment.   If you are experiencing a Mental Health or Behavioral Health Crisis or need someone to talk to, please call the Suicide and Crisis Lifeline: 988 call 911   Patient verbalizes understanding of instructions and care plan provided today and agrees to view in MyChart. Active MyChart status and patient understanding of how to access instructions and care plan via MyChart confirmed with patient.     Jenel Lucks, MSW, LCSW [facility] Care Management [place]  [facility] [email or web address] Phone [phone] 9:11 AM

## 2023-04-08 NOTE — Patient Outreach (Signed)
  Care Coordination   Follow Up Visit Note   04/05/2023 Name: Omar Krause MRN: [id number] DOB: 10/03/1991  Omar Krause is a 32 y.o. year old male who sees Ardith Dark, MD for primary care. I spoke with  Omar Krause by phone today.  What matters to the patients health and wellness today?  Housing (Emergency)    Goals Addressed             This Visit's Progress    Obtain Supportive Resources-Housing   On track    Activities and task to complete in order to accomplish goals.   Visit local DSS office to inquire about Medicaid benefits Continue with compliance of taking medication prescribed by Doctor Implement healthy coping skills discussed to assist with management of symptoms. Inform PCP of counselor and psychiatrist referral Continue working with [facility] care team to assist with goals identified Complete disability application at ssa.gov F/up with [facility] to assist with emergency housing needs         SDOH assessments and interventions completed:  No     Care Coordination Interventions:  Yes, provided  Interventions Today    Flowsheet Row Most Recent Value  General Interventions   General Interventions Discussed/Reviewed [employer] is having difficulty securing emergency housing due to having emotional support animal. States local agency, [facility] [place], will contact him on Tuesday for assistance]  Mental Health Interventions   Mental Health Discussed/Reviewed Mental Health Reviewed  [Validation and encouragement provided]       Follow up plan:  LCSW will await pt to contact her, due to patient not having an operable phone    Encounter Outcome:  Pt. Visit Completed   Jenel Lucks, MSW, LCSW [facility] Care Management [facility] Health  [facility] [email or web address] Phone [phone] 9:11 AM

## 2023-04-09 NOTE — Patient Instructions (Signed)
Visit Information  Thank you for taking time to visit with me today. Please don't hesitate to contact me if I can be of assistance to you.   Following are the goals we discussed today:   Goals Addressed             This Visit's Progress    Obtain Supportive Resources-Housing   On track    Activities and task to complete in order to accomplish goals.   Visit local DSS office to inquire about Medicaid benefits Continue with compliance of taking medication prescribed by Doctor Implement healthy coping skills discussed to assist with management of symptoms. Inform PCP of counselor and psychiatrist referral Continue working with [facility] care team to assist with goals identified Complete disability application at ssa.gov         Please call the care guide team at [phone] if you need to cancel or reschedule your appointment.   If you are experiencing a Mental Health or Behavioral Health Crisis or need someone to talk to, please call the Suicide and Crisis Lifeline: 988 call 911   Patient verbalizes understanding of instructions and care plan provided today and agrees to view in MyChart. Active MyChart status and patient understanding of how to access instructions and care plan via MyChart confirmed with patient.     Jenel Lucks, MSW, LCSW [facility] Care Management [place]  [facility] [email or web address] Phone [phone] 7:10 PM

## 2023-04-09 NOTE — Patient Outreach (Signed)
  Care Coordination   Follow Up Visit Note   04/08/2023 Name: Omar Krause MRN: [id number] DOB: 1991/01/26  Omar Krause is a 32 y.o. year old male who sees Ardith Dark, MD for primary care. I spoke with  Omar Krause by phone today.  What matters to the patients health and wellness today?  Supportive Resources    Goals Addressed             This Visit's Progress    Obtain Supportive Resources-Housing   On track    Activities and task to complete in order to accomplish goals.   Visit local DSS office to inquire about Medicaid benefits Continue with compliance of taking medication prescribed by Doctor Implement healthy coping skills discussed to assist with management of symptoms. Inform PCP of counselor and psychiatrist referral Continue working with [facility] care team to assist with goals identified Complete disability application at ssa.gov         SDOH assessments and interventions completed:  No     Care Coordination Interventions:  Yes, provided  Interventions Today    Flowsheet Row Most Recent Value  Chronic Disease   Chronic disease during today's visit Hypertension (HTN), Other  [GAD, PTSD, Depression]  General Interventions   General Interventions Discussed/Reviewed [facility]  [Pt was denied stay at the [facility] and has decided to keep emotional support animal in his care and is not interested in information on emergency shelters. He is using the [employer] to f/up with community resources and LCSW]  Mental Health Interventions   Mental Health Discussed/Reviewed Mental Health Reviewed  [Pt is interested in registering emotional support animal as a psychiatric service animal stating that she is beneficial to his management of symptoms and safety]       Follow up plan:  LCSW will await correspondence from pt    Encounter Outcome:  Pt. Visit Completed   Jenel Lucks, MSW, LCSW [facility] Care Management [facility] Health  [facility] [email or web address] Phone [phone] 7:09 PM

## 2023-04-10 ENCOUNTER — Encounter: Payer: Self-pay | Admitting: Licensed Clinical Social Worker

## 2023-04-11 NOTE — Patient Outreach (Signed)
  Care Coordination   Follow Up Visit Note   04/10/2023 Name: Omar Krause MRN: [id number] DOB: Feb 22, 1991  Omar Krause is a 32 y.o. year old male who sees Ardith Dark, MD for primary care.   What matters to the patients health and wellness today?  Patient was not engaged during this encounter telephonically    Goals Addressed             This Visit's Progress    Obtain Supportive Resources-Housing   On track    Activities and task to complete in order to accomplish goals.   Visit local DSS office to inquire about Medicaid benefits Continue with compliance of taking medication prescribed by Doctor Implement healthy coping skills discussed to assist with management of symptoms. Inform PCP of counselor and psychiatrist referral Continue working with [facility] care team to assist with goals identified Complete disability application at ssa.gov         SDOH assessments and interventions completed:  No     Care Coordination Interventions:  Yes, provided  Interventions Today    Flowsheet Row Most Recent Value  General Interventions   General Interventions Discussed/Reviewed [employer] of day centers to assist with strengthening support researched and provided to pt via email]       Follow up plan: Follow up call scheduled for 1-2 weeks    Encounter Outcome:  Pt. Visit Completed   Jenel Lucks, MSW, LCSW [facility] Care Management [facility] Health  [facility] [email or web address] Phone [phone] 3:54 PM

## 2023-04-15 ENCOUNTER — Encounter: Payer: Self-pay | Admitting: Family Medicine

## 2023-04-15 NOTE — Telephone Encounter (Signed)
Please advise 

## 2023-04-16 NOTE — Telephone Encounter (Signed)
Ok to fill out form. Please print and fill out what you can and I can fill in the rest.  [name]. [name], MD 04/16/2023 2:47 PM

## 2023-04-17 ENCOUNTER — Encounter: Payer: Self-pay | Admitting: Licensed Clinical Social Worker

## 2023-04-18 ENCOUNTER — Telehealth: Payer: Self-pay | Admitting: Family Medicine

## 2023-04-18 DIAGNOSIS — F1211 Cannabis abuse, in remission: Secondary | ICD-10-CM | POA: Diagnosis not present

## 2023-04-18 NOTE — Patient Outreach (Signed)
  Care Coordination   Follow Up Visit Note   04/17/2023 Name: Omar Krause MRN: [id number] DOB: 05-01-1991  Omar Krause is a 32 y.o. year old male who sees Ardith Dark, MD for primary care. I spoke with  Omar Krause by email today.  What matters to the patients health and wellness today?  Supportive resources, transportation, housing    Goals Addressed             This Visit's Progress    Obtain Supportive Resources-Housing   On track    Activities and task to complete in order to accomplish goals.   Continue with compliance of taking medication prescribed by Doctor. F/up with [facility] Implement healthy coping skills discussed to assist with management of symptoms Continue working with [facility] care team to assist with goals identified Complete disability application at ssa.gov         SDOH assessments and interventions completed:  Yes  SDOH Interventions Today    Flowsheet Row Most Recent Value  SDOH Interventions   Food Insecurity Interventions Intervention Not Indicated, Other (Comment)  [Patient is recieving meals through Shelter]  Housing Interventions Intervention Not Indicated, Other (Comment)  [Patient is residing at the [employer] Interventions Intervention Not Indicated  [Patient is utilizing city transist and has applied for reduced fee services]        Care Coordination Interventions:  Yes, provided  Interventions Today    Flowsheet Row Most Recent Value  Chronic Disease   Chronic disease during today's visit Hypertension (HTN), Other  [PTSD and MDD]  General Interventions   General Interventions Discussed/Reviewed General Interventions Reviewed, Community Resources  Mental Health Interventions   Mental Health Discussed/Reviewed Mental Health Reviewed  Nutrition Interventions   Nutrition Discussed/Reviewed Nutrition Reviewed  Pharmacy Interventions   Pharmacy Dicussed/Reviewed Pharmacy Topics Reviewed, Medication  Adherence       Follow up plan:  Patient will follow up with LCSW due to limited phone access    Encounter Outcome:  Pt. Visit Completed   Jenel Lucks, MSW, LCSW [facility] Care Management [facility] Health  [facility] [email or web address] Phone [phone] 2:43 PM

## 2023-04-18 NOTE — Patient Instructions (Signed)
Visit Information  Thank you for taking time to visit with me today. Please don't hesitate to contact me if I can be of assistance to you.   Following are the goals we discussed today:   Goals Addressed             This Visit's Progress    Obtain Supportive Resources-Housing   On track    Activities and task to complete in order to accomplish goals.   Continue with compliance of taking medication prescribed by Doctor. F/up with [facility] Implement healthy coping skills discussed to assist with management of symptoms Continue working with [facility] care team to assist with goals identified Complete disability application at ssa.gov          Please call the care guide team at [phone] if you need to cancel or reschedule your appointment.   If you are experiencing a Mental Health or Behavioral Health Crisis or need someone to talk to, please call the Suicide and Crisis Lifeline: 988 call 911   Patient verbalizes understanding of instructions and care plan provided today and agrees to view in MyChart. Active MyChart status and patient understanding of how to access instructions and care plan via MyChart confirmed with patient.     Jenel Lucks, MSW, LCSW [facility] Care Management [place]  [facility] [email or web address] Phone [phone] 2:44 PM

## 2023-04-18 NOTE — Telephone Encounter (Signed)
Pt states he had spoken to someone at [facility] and they are supposed to send a message relating to sending them alist of medications for them to fill. Please advise.

## 2023-04-22 ENCOUNTER — Telehealth: Payer: Self-pay | Admitting: Family Medicine

## 2023-04-22 NOTE — Telephone Encounter (Signed)
Tried to call pt back, unable to leave message voicemail box is full.

## 2023-04-22 NOTE — Telephone Encounter (Signed)
Pt has called again inquiring about the form to be sent to [facility]. Please advise.

## 2023-04-22 NOTE — Telephone Encounter (Signed)
See message in My Chart

## 2023-04-22 NOTE — Telephone Encounter (Signed)
Tried calling pt back twice, unable to leave message.

## 2023-04-22 NOTE — Telephone Encounter (Signed)
Tried to call again no answer, unable to leave message.

## 2023-04-22 NOTE — Telephone Encounter (Signed)
Patient called stating he was returning Donna's call in terms of some paperwork he needed completed. Pt can be reached @ [phone].

## 2023-04-23 ENCOUNTER — Telehealth: Payer: Self-pay | Admitting: Licensed Clinical Social Worker

## 2023-04-23 NOTE — Patient Instructions (Signed)
Visit Information  Thank you for taking time to visit with me today. Please don't hesitate to contact me if I can be of assistance to you.   Following are the goals we discussed today:   Goals Addressed             This Visit's Progress    Obtain Supportive Resources-Housing   On track    Activities and task to complete in order to accomplish goals.   Continue with compliance of taking medication prescribed by Doctor. F/up with [facility] Implement healthy coping skills discussed to assist with management of symptoms Continue working with [facility] care team to assist with goals identified Complete disability application at ssa.gov        Please call the care guide team at [phone] if you need to cancel or reschedule your appointment.   If you are experiencing a Mental Health or Behavioral Health Crisis or need someone to talk to, please call the Suicide and Crisis Lifeline: 988 call 911   Patient verbalizes understanding of instructions and care plan provided today and agrees to view in MyChart. Active MyChart status and patient understanding of how to access instructions and care plan via MyChart confirmed with patient.     Jenel Lucks, MSW, LCSW [facility] Care Management [place]  [facility] [email or web address] Phone [phone] 6:05 PM

## 2023-04-23 NOTE — Patient Outreach (Signed)
  Care Coordination   Follow Up Visit Note   04/22/2023 Name: Omar Krause MRN: [id number] DOB: [birth year]  Omar Krause is a 32 y.o. year old male who sees Ardith Dark, MD for primary care.   What matters to the patients health and wellness today?  Transportation and SSA    Goals Addressed             This Visit's Progress    Obtain Supportive Resources-Housing   On track    Activities and task to complete in order to accomplish goals.   Continue with compliance of taking medication prescribed by Doctor. F/up with [facility] Implement healthy coping skills discussed to assist with management of symptoms Continue working with [facility] care team to assist with goals identified Complete disability application at ssa.gov         SDOH assessments and interventions completed:  No     Care Coordination Interventions:  Yes, provided  Interventions Today    Flowsheet Row Most Recent Value  Chronic Disease   Chronic disease during today's visit Hypertension (HTN), Other  [GAD, PTSD, Depression]  General Interventions   General Interventions Discussed/Reviewed General Interventions Reviewed  [Pt requesting half-fare transportation form to be completed by PCP and sent to him prior to submitting to [facility]. Pt will also try to schedule appt with [place], when he obtains a phone from shelter]       Follow up plan:  Pt will follow up with LCSW    Encounter Outcome:  Pt. Visit Completed   Jenel Lucks, MSW, LCSW [facility] Care Management [facility] Health  [facility] [email or web address] Phone [phone] 6:05 PM

## 2023-04-24 ENCOUNTER — Telehealth: Payer: Self-pay | Admitting: *Deleted

## 2023-04-24 NOTE — Telephone Encounter (Signed)
Form was faxed on 04/23/2023

## 2023-04-24 NOTE — Telephone Encounter (Signed)
Half fare bus services eligibility application faxed to [phone] Form placed to be scan in patient chart

## 2023-05-07 ENCOUNTER — Telehealth: Payer: Self-pay | Admitting: Licensed Clinical Social Worker

## 2023-05-08 ENCOUNTER — Telehealth (INDEPENDENT_AMBULATORY_CARE_PROVIDER_SITE_OTHER): Payer: Medicaid Other | Admitting: Family Medicine

## 2023-05-08 ENCOUNTER — Encounter: Payer: Self-pay | Admitting: Family Medicine

## 2023-05-08 VITALS — Ht 72.0 in | Wt 220.0 lb

## 2023-05-08 DIAGNOSIS — F411 Generalized anxiety disorder: Secondary | ICD-10-CM | POA: Diagnosis not present

## 2023-05-08 DIAGNOSIS — F4310 Post-traumatic stress disorder, unspecified: Secondary | ICD-10-CM | POA: Diagnosis not present

## 2023-05-08 DIAGNOSIS — F321 Major depressive disorder, single episode, moderate: Secondary | ICD-10-CM

## 2023-05-08 DIAGNOSIS — M5412 Radiculopathy, cervical region: Secondary | ICD-10-CM

## 2023-05-08 DIAGNOSIS — I10 Essential (primary) hypertension: Secondary | ICD-10-CM | POA: Diagnosis not present

## 2023-05-08 MED ORDER — LOSARTAN POTASSIUM-HCTZ 100-25 MG PO TABS: 1.0000 | ORAL_TABLET | Freq: Every day | ORAL | 3 refills | Status: DC

## 2023-05-08 MED ORDER — GABAPENTIN 300 MG PO CAPS: 300.0000 mg | ORAL_CAPSULE | Freq: Three times a day (TID) | ORAL | 3 refills | Status: DC
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
## 2023-05-08 NOTE — Assessment & Plan Note (Signed)
Blood pressure not controlled however he has been off medications for few weeks.  We will restart losartan-HCTZ 100-25.  He has done well with this previously.  He will monitor at home and check in with [place] in a couple of weeks.  If still elevated would consider restarting propranolol versus addition of amlodipine.

## 2023-05-08 NOTE — Assessment & Plan Note (Addendum)
He is now working with outpatient psychiatry.  Overall his PTSD, OCD, depression, and generalized anxiety are stable on current regimen Zyprexa 5 mg daily, venlafaxine 75 mg daily, and trazodone 50 mg nightly.  His psychiatrist would like for him to be evaluated for ASD to see if this may be contributing to his above conditions.  Will place referral to psychology to help with this assessment.   He is working with care management for housing.  Currently staying in a homeless shelter.  We will provide a letter so that his dog can continue to stay with him at a shelter. His dog provides him with support to help treat his underlying psychiatric conditions.  He is also currently on the process for applying for disability.  Working with a Clinical research associate for this.  Hopefully this will be approved soon.

## 2023-05-09 ENCOUNTER — Telehealth: Payer: Self-pay | Admitting: Family Medicine

## 2023-05-09 NOTE — Telephone Encounter (Signed)
Pt is at a homeless shelter and needs a letter stating he can have a fan for medical reasons for him to sleep at night. Please advise.

## 2023-05-09 NOTE — Patient Outreach (Signed)
  Care Coordination   Follow Up Visit Note   05/07/2023 Name: Omar Krause MRN: [id number] DOB: December 02, 1990  Omar Krause is a 32 y.o. year old male who sees Ardith Dark, MD for primary care. I spoke with  Omar Krause by phone today.  What matters to the patients health and wellness today?  Symptom Management    Goals Addressed             This Visit's Progress    Obtain Supportive Resources-Housing   On track    Activities and task to complete in order to accomplish goals.   Continue with compliance of taking medication prescribed by Doctor. F/up with [facility] Implement healthy coping skills discussed to assist with management of symptoms Continue working with [facility] care team to assist with goals identified Complete disability application at ssa.gov         SDOH assessments and interventions completed:  No     Care Coordination Interventions:  Yes, provided  Interventions Today    Flowsheet Row Most Recent Value  Chronic Disease   Chronic disease during today's visit Hypertension (HTN), Other  [Depression]  General Interventions   General Interventions Discussed/Reviewed General Interventions Reviewed, [facility], Doctor Visits  Doctor Visits Discussed/Reviewed Doctor Visits Reviewed  Mental Health Interventions   Mental Health Discussed/Reviewed Mental Health Reviewed, Coping Strategies, Depression, Anxiety  Nutrition Interventions   Nutrition Discussed/Reviewed Nutrition Reviewed  Pharmacy Interventions   Pharmacy Dicussed/Reviewed Pharmacy Topics Reviewed, Medication Adherence  Safety Interventions   Safety Discussed/Reviewed Safety Reviewed       Follow up plan: Follow up call scheduled for 2 weeks    Encounter Outcome:  Pt. Visit Completed   Jenel Lucks, MSW, LCSW [facility] Care Management [facility] Health  [facility] [email or web address] Phone [phone] 12:40 AM

## 2023-05-09 NOTE — Telephone Encounter (Signed)
Ok to write letter

## 2023-05-09 NOTE — Patient Instructions (Signed)
Visit Information  Thank you for taking time to visit with me today. Please don't hesitate to contact me if I can be of assistance to you.   Following are the goals we discussed today:   Goals Addressed             This Visit's Progress    Obtain Supportive Resources-Housing   On track    Activities and task to complete in order to accomplish goals.   Continue with compliance of taking medication prescribed by Doctor. F/up with [facility] Implement healthy coping skills discussed to assist with management of symptoms Continue working with [facility] care team to assist with goals identified Complete disability application at ssa.gov         Please call the care guide team at [phone] if you need to cancel or reschedule your appointment.   If you are experiencing a Mental Health or Behavioral Health Crisis or need someone to talk to, please call the Suicide and Crisis Lifeline: 988 call 911   Patient verbalizes understanding of instructions and care plan provided today and agrees to view in MyChart. Active MyChart status and patient understanding of how to access instructions and care plan via MyChart confirmed with patient.     Jenel Lucks, MSW, LCSW [facility] Care Management [place]  [facility] [email or web address] Phone [phone] 12:41 AM

## 2023-05-09 NOTE — Telephone Encounter (Signed)
Ok with me.  [name]. [name], MD 05/09/2023 11:35 PM

## 2023-05-13 ENCOUNTER — Ambulatory Visit: Payer: Medicaid Other | Admitting: Family Medicine

## 2023-05-15 ENCOUNTER — Encounter: Payer: Self-pay | Admitting: Family Medicine

## 2023-05-15 NOTE — Telephone Encounter (Signed)
Letter done and send to patient

## 2023-05-17 ENCOUNTER — Telehealth: Payer: Self-pay | Admitting: Family Medicine

## 2023-05-17 ENCOUNTER — Telehealth: Payer: Self-pay | Admitting: Licensed Clinical Social Worker

## 2023-05-17 NOTE — Telephone Encounter (Signed)
Please advise 

## 2023-05-17 NOTE — Patient Outreach (Signed)
  Care Coordination   Follow Up Visit Note   05/16/2023 Name: Omar Krause MRN: [id number] DOB: 03/02/1991  Omar Krause is a 32 y.o. year old male who sees Ardith Dark, MD for primary care. I spoke with  Omar Krause by phone today.  What matters to the patients health and wellness today?  Symptom Management    Goals Addressed             This Visit's Progress    Obtain Supportive Resources-Housing   On track    Activities and task to complete in order to accomplish goals.   Continue with compliance of taking medication prescribed by Doctor. F/up with [facility] Implement healthy coping skills discussed to assist with management of symptoms Continue working with [facility] care team to assist with goals identified Complete disability application at ssa.gov         SDOH assessments and interventions completed:  No     Care Coordination Interventions:  Yes, provided  Interventions Today    Flowsheet Row Most Recent Value  Chronic Disease   Chronic disease during today's visit Hypertension (HTN), Other  [Depression and PTSD]  General Interventions   General Interventions Discussed/Reviewed General Interventions Reviewed, [facility], Doctor Visits  Doctor Visits Discussed/Reviewed Doctor Visits Reviewed  Exercise Interventions   Exercise Discussed/Reviewed Exercise Reviewed, Physical Activity  Mental Health Interventions   Mental Health Discussed/Reviewed Mental Health Reviewed, Coping Strategies, Depression, Anxiety  [Pt endorses an increase in energy and positive mood after resuming gabapentin.]  Nutrition Interventions   Nutrition Discussed/Reviewed Nutrition Reviewed  Pharmacy Interventions   Pharmacy Dicussed/Reviewed Pharmacy Topics Reviewed, Medication Adherence  [Pt has been taking prescribed med for HTN. States he is growing blond hair as a side effect. Will continue to monitor and inform PCP office with any concerns]  Safety Interventions   Safety  Discussed/Reviewed Safety Reviewed       Follow up plan: Follow up call scheduled for 2-4 weeks    Encounter Outcome:  Pt. Visit Completed   Jenel Lucks, MSW, LCSW [facility] Care Management [facility]  [facility] [email or web address] Phone [phone] 6:46 AM

## 2023-05-17 NOTE — Telephone Encounter (Signed)
Pt states he states the RX Losartan is making his HR is high and wanted to know if he can start beta blocker. Please advise.

## 2023-05-17 NOTE — Patient Instructions (Signed)
Visit Information  Thank you for taking time to visit with me today. Please don't hesitate to contact me if I can be of assistance to you.   Following are the goals we discussed today:   Goals Addressed             This Visit's Progress    Obtain Supportive Resources-Housing   On track    Activities and task to complete in order to accomplish goals.   Continue with compliance of taking medication prescribed by Doctor. F/up with [facility] Implement healthy coping skills discussed to assist with management of symptoms Continue working with [facility] care team to assist with goals identified Complete disability application at ssa.gov          Please call the care guide team at [phone] if you need to cancel or reschedule your appointment.   If you are experiencing a Mental Health or Behavioral Health Crisis or need someone to talk to, please call the Suicide and Crisis Lifeline: 988 call 911   Patient verbalizes understanding of instructions and care plan provided today and agrees to view in MyChart. Active MyChart status and patient understanding of how to access instructions and care plan via MyChart confirmed with patient.     Jenel Lucks, MSW, LCSW [facility] Care Management [place]  [facility] [email or web address] Phone [phone] 6:46 AM

## 2023-05-20 ENCOUNTER — Other Ambulatory Visit: Payer: Self-pay | Admitting: *Deleted

## 2023-05-20 MED ORDER — PROPRANOLOL HCL ER 60 MG PO CP24: ORAL_CAPSULE | ORAL | 3 refills | Status: DC

## 2023-05-20 NOTE — Telephone Encounter (Signed)
Ok to send in propranolol ER 60 mg daily. HE has been on this previously so he should do well with it. He should follow up with [place] in a week or two.  [name]. [name], MD 05/20/2023 7:22 AM

## 2023-05-20 NOTE — Telephone Encounter (Signed)
Rx send to [facility],patient notified

## 2023-05-30 ENCOUNTER — Encounter: Payer: Self-pay | Admitting: Licensed Clinical Social Worker

## 2023-05-31 NOTE — Patient Outreach (Signed)
  Care Coordination   Follow Up Visit Note   05/30/2023 Name: Omar Krause MRN: [id number] DOB: 29-Oct-1990  Omar Krause is a 32 y.o. year old male who sees Omar Dark, MD for primary care. I  engaged with DSS to obtain social services disability info  What matters to the patients health and wellness today?  Patient was not engaged during this encounter    Goals Addressed             This Visit's Progress    Obtain Supportive Resources-Housing   On track    Activities and task to complete in order to accomplish goals.   Continue with compliance of taking medication prescribed by Doctor. F/up with [facility] Implement healthy coping skills discussed to assist with management of symptoms Continue working with [facility] care team to assist with goals identified Complete disability application at ssa.gov         SDOH assessments and interventions completed:  No     Care Coordination Interventions:  Yes, provided   Follow up plan: Follow up call scheduled for 1-2 weeks    Encounter Outcome:  Pt. Visit Completed   Omar Krause, MSW, LCSW [facility] Care Management [facility]  [facility] [email or web address] Phone [phone] 7:35 AM

## 2023-06-10 ENCOUNTER — Encounter: Payer: Self-pay | Admitting: Licensed Clinical Social Worker

## 2023-06-11 NOTE — Patient Instructions (Signed)
Visit Information  Thank you for taking time to visit with me today. Please don't hesitate to contact me if I can be of assistance to you.   Following are the goals we discussed today:   Goals Addressed             This Visit's Progress    Obtain Supportive Resources-Housing   On track    Activities and task to complete in order to accomplish goals.   Continue with compliance of taking medication prescribed by Doctor. F/up with [facility] Implement healthy coping skills discussed to assist with management of symptoms Continue working with [facility] care team to assist with goals identified Complete disability application at ssa.gov        Please call the care guide team at [phone] if you need to cancel or reschedule your appointment.   If you are experiencing a Mental Health or Behavioral Health Crisis or need someone to talk to, please call the Suicide and Crisis Lifeline: 988 call 911   Patient verbalizes understanding of instructions and care plan provided today and agrees to view in MyChart. Active MyChart status and patient understanding of how to access instructions and care plan via MyChart confirmed with patient.     Jenel Lucks, MSW, LCSW [facility] Care Management [place]  [facility] [email or web address] Phone [phone] 11:42 PM

## 2023-06-11 NOTE — Patient Outreach (Signed)
  Care Coordination   Follow Up Visit Note   06/10/2023 Name: Machai Law Krause MRN: [id number] DOB: 04/20/1991  Omar Krause is a 32 y.o. year old male who sees Ardith Dark, MD for primary care. I spoke with  Omar Krause by phone today.  What matters to the patients health and wellness today?  Patient was attempting to provide update; however, LCSW had difficulty hearing while patient was on public transit. Patient agreed to call LCSW back once he arrived to destination.    Goals Addressed             This Visit's Progress    Obtain Supportive Resources-Housing   On track    Activities and task to complete in order to accomplish goals.   Continue with compliance of taking medication prescribed by Doctor. F/up with [facility] Implement healthy coping skills discussed to assist with management of symptoms Continue working with [facility] care team to assist with goals identified Complete disability application at ssa.gov         SDOH assessments and interventions completed:  No     Care Coordination Interventions:  Yes, provided  Interventions Today    Flowsheet Row Most Recent Value  Chronic Disease   Chronic disease during today's visit Hypertension (HTN), Other  [MDD, PTSD, GAD]  General Interventions   General Interventions Discussed/Reviewed General Interventions Reviewed       Follow up plan:  LCSW will await callback from pt    Encounter Outcome:  Pt. Visit Completed   Jenel Lucks, MSW, LCSW [facility] Care Management [facility]  [facility] [email or web address] Phone [phone] 11:41 PM

## 2023-07-18 ENCOUNTER — Ambulatory Visit: Payer: Medicaid Other | Admitting: Family Medicine

## 2023-07-18 ENCOUNTER — Telehealth: Payer: Self-pay | Admitting: Family Medicine

## 2023-07-18 ENCOUNTER — Telehealth: Payer: Self-pay | Admitting: Licensed Clinical Social Worker

## 2023-07-18 NOTE — Telephone Encounter (Signed)
LVM informing pt of no show status from today's visit. I offered pt to call back for reschedule.

## 2023-07-19 NOTE — Patient Outreach (Signed)
  Care Coordination   Follow Up Visit Note   07/18/2023 Name: Omar Krause MRN: [id number] DOB: [birth year]  Omar Krause is a 32 y.o. year old male who sees Ardith Dark, MD for primary care. I spoke with  Omar Krause by phone today.  What matters to the patients health and wellness today?  Symptom Management    Goals Addressed             This Visit's Progress    Obtain Supportive Resources-Housing   On track    Activities and task to complete in order to accomplish goals.   Continue with compliance of taking medication prescribed by Doctor. F/up with [facility] Implement healthy coping skills discussed to assist with management of symptoms Continue working with [facility] care team to assist with goals identified Complete disability application at ssa.gov Register for transportation through [employer] plan Re-schedule f/up appt with PCP         SDOH assessments and interventions completed:  No     Care Coordination Interventions:  Yes, provided  Interventions Today    Flowsheet Row Most Recent Value  Chronic Disease   Chronic disease during today's visit Hypertension (HTN), Other  [PTSD, MDD, GAD]  General Interventions   General Interventions Discussed/Reviewed General Interventions Reviewed, Doctor Visits, Community Resources  [Patient missed PCP appt due to transportation barriers. Discussed benefits with managed medicaid plan and encouraged pt to register for transportation services]  Doctor Visits Discussed/Reviewed Doctor Visits Reviewed  Mental Health Interventions   Mental Health Discussed/Reviewed Mental Health Reviewed, Coping Strategies, Anxiety, Depression  [Pt and [name] has left the shelter after not hearing about any update regarding housing intake in approx nine weeks. They are staying with a friend and feels safe.]       Follow up plan: Follow up call scheduled for 2-4 weeks    Encounter Outcome:  Patient Visit Completed   Jenel Lucks, MSW, LCSW [facility] Care Management [facility] Health  [facility] [email or web address] Phone [phone] 8:02 AM

## 2023-07-19 NOTE — Patient Instructions (Signed)
Visit Information  Thank you for taking time to visit with me today. Please don't hesitate to contact me if I can be of assistance to you.   Following are the goals we discussed today:   Goals Addressed             This Visit's Progress    Obtain Supportive Resources-Housing   On track    Activities and task to complete in order to accomplish goals.   Continue with compliance of taking medication prescribed by Doctor. F/up with [facility] Implement healthy coping skills discussed to assist with management of symptoms Continue working with [facility] care team to assist with goals identified Complete disability application at ssa.gov Register for transportation through [employer] plan Re-schedule f/up appt with PCP         Our next appointment is by telephone on 10/10   Please call the care guide team at [phone] if you need to cancel or reschedule your appointment.   If you are experiencing a Mental Health or Behavioral Health Crisis or need someone to talk to, please call the Suicide and Crisis Lifeline: 988 call 911   Patient verbalizes understanding of instructions and care plan provided today and agrees to view in MyChart. Active MyChart status and patient understanding of how to access instructions and care plan via MyChart confirmed with patient.     Jenel Lucks, MSW, LCSW [facility] Care Management [place]  [facility] [email or web address] Phone [phone] 8:03 AM

## 2023-08-01 ENCOUNTER — Encounter: Payer: Self-pay | Admitting: Licensed Clinical Social Worker

## 2023-08-01 ENCOUNTER — Telehealth: Payer: Self-pay | Admitting: Licensed Clinical Social Worker

## 2023-08-01 NOTE — Patient Outreach (Signed)
  Care Coordination   08/01/2023 Name: Omar Krause MRN: [id number] DOB: 01/21/1991   Care Coordination Outreach Attempts:  An unsuccessful telephone outreach was attempted for a scheduled appointment today.  Follow Up Plan:  Additional outreach attempts will be made to offer the patient care coordination information and services.   Encounter Outcome:  No Answer   Care Coordination Interventions:  No, not indicated    Jenel Lucks, MSW, LCSW [facility] Care Management [place]  [facility] [email or web address] Phone [phone] 3:20 PM

## 2023-08-20 ENCOUNTER — Encounter: Payer: Self-pay | Admitting: Family Medicine

## 2023-08-20 NOTE — Telephone Encounter (Signed)
See note

## 2023-08-20 NOTE — Telephone Encounter (Signed)
Ok to send in emergency refill for each of these of these but ongoing management should come from psychiatry.  Omar Krause. [name], MD 08/20/2023 3:20 PM

## 2023-08-21 NOTE — Telephone Encounter (Signed)
noted 

## 2023-08-28 ENCOUNTER — Encounter: Payer: Self-pay | Admitting: Family Medicine

## 2023-08-29 NOTE — Telephone Encounter (Signed)
Please advice  

## 2023-08-29 NOTE — Telephone Encounter (Signed)
Can we have him schedule an appointment to discuss?  [name]. [name], MD 08/29/2023 12:38 PM

## 2023-09-10 ENCOUNTER — Encounter: Payer: Self-pay | Admitting: Family Medicine

## 2023-09-10 ENCOUNTER — Ambulatory Visit (INDEPENDENT_AMBULATORY_CARE_PROVIDER_SITE_OTHER): Payer: Medicaid Other | Admitting: Family Medicine

## 2023-09-10 VITALS — BP 141/79 | HR 57 | Temp 97.8°F | Ht 72.0 in | Wt 269.0 lb

## 2023-09-10 DIAGNOSIS — F5102 Adjustment insomnia: Secondary | ICD-10-CM | POA: Diagnosis not present

## 2023-09-10 DIAGNOSIS — F321 Major depressive disorder, single episode, moderate: Secondary | ICD-10-CM

## 2023-09-10 DIAGNOSIS — I10 Essential (primary) hypertension: Secondary | ICD-10-CM | POA: Diagnosis not present

## 2023-09-10 DIAGNOSIS — F411 Generalized anxiety disorder: Secondary | ICD-10-CM | POA: Diagnosis not present

## 2023-09-10 MED ORDER — ZOLPIDEM TARTRATE 10 MG PO TABS: 10.0000 mg | ORAL_TABLET | Freq: Every evening | ORAL | 1 refills | Status: DC | PRN

## 2023-09-10 NOTE — Patient Instructions (Signed)
It was very nice to see you today!  We will restart ambein.  Please follow-up with me in a few weeks let me know how this is working.  Will place Steri-Strips on your laceration today.  This should improve over the next several days.  Let us know if you have any signs of infection.  Return if symptoms worsen or fail to improve.   Take care, Dr [name]  PLEASE NOTE:  If you had any lab tests, please let us know if you have not heard back within a few days. You may see your results on mychart before we have a chance to review them but we will give you a call once they are reviewed by [place].   If we ordered any referrals today, please let us know if you have not heard from their office within the next week.   If you had any urgent prescriptions sent in today, please check with the pharmacy within an hour of our visit to make sure the prescription was transmitted appropriately.   Please try these tips to maintain a healthy lifestyle:  Eat at least 3 REAL meals and 1-2 snacks per day.  Aim for no more than 5 hours between eating.  If you eat breakfast, please do so within one hour of getting up.   Each meal should contain half fruits/vegetables, one quarter protein, and one quarter carbs (no bigger than a computer mouse)  Cut down on sweet beverages. This includes juice, soda, and sweet tea.   Drink at least 1 glass of water with each meal and aim for at least 8 glasses per day  Exercise at least 150 minutes every week.

## 2023-09-10 NOTE — Assessment & Plan Note (Addendum)
Mildly elevated today.  Continue losartan HCTZ 100-25 daily and propranolol 60 mg daily.  He will continue to monitor at home and let us know if persistently elevated.  We can increase dose of propranolol if needed.

## 2023-09-10 NOTE — Assessment & Plan Note (Signed)
Symptoms are not controlled.  Had lengthy discussion with patient today regarding management for his psychiatric conditions.  He has having a a lot of social issues which are contributing significantly to his insomnia, stress, anxiety, and depression.  He is currently prescribed Zyprexa 5 mg daily, trazodone 50 mg daily, venlafaxine 75 mg nightly, gabapentin 300 mg 3 times daily by psychiatry.  He has previously been on clonazepam and did well with this for his sleep however we will avoid going back to benzos at this point.  He has done well with Ambien in the past.  Tolerated well without any significant side effects.  We will refill this today.  Database was reviewed without red flags.  He can follow-up with me in a couple weeks via MyChart though encouraged patient to discuss this with his psychiatrist at their next office visit.

## 2023-09-10 NOTE — Assessment & Plan Note (Signed)
See anxiety A/P.  Continue Zyprexa 5 mg daily, venlafaxine 75 mg daily, gabapentin 300 mg 3 times daily, and trazodone 50 mg nightly per psychiatry.  Will be adding on Ambien as above to help with his insomnia.  Will also refer him to see a therapist as he has had difficulty finding one that accepts his insurance.  Will defer further management to his psychiatrist.

## 2023-09-10 NOTE — Progress Notes (Signed)
Omar Krause is a 32 y.o. male who presents today for an office visit.  Assessment/Plan:  Chronic Problems Addressed Today: Insomnia Symptoms are not controlled.  Had lengthy discussion with patient today regarding management for his psychiatric conditions.  He has having a a lot of social issues which are contributing significantly to his insomnia, stress, anxiety, and depression.  He is currently prescribed Zyprexa 5 mg daily, trazodone 50 mg daily, venlafaxine 75 mg nightly, gabapentin 300 mg 3 times daily by psychiatry.  He has previously been on clonazepam and did well with this for his sleep however we will avoid going back to benzos at this point.  He has done well with Ambien in the past.  Tolerated well without any significant side effects.  We will refill this today.  Database was reviewed without red flags.  He can follow-up with me in a couple weeks via MyChart though encouraged patient to discuss this with his psychiatrist at their next office visit.  HTN (hypertension) Mildly elevated today.  Continue losartan HCTZ 100-25 daily and propranolol 60 mg daily.  He will continue to monitor at home and let us know if persistently elevated.  We can increase dose of propranolol if needed.  Generalized anxiety disorder Following with psychiatry.  This is contributing to his above insomnia.  We will continue his current medications as prescribed by psychiatry however we will be adding on Ambien as above to help with his sleep.  He is working with social work to help with housing placement and working with a Clinical research associate for his legal issues.  He has had some trouble with finding a therapist and will work with his insurance-will place referral today.  Will defer further management to psychiatry.  We discussed reasons to return to care.  Depression, major, single episode, moderate (HCC) See anxiety A/P.  Continue Zyprexa 5 mg daily, venlafaxine 75 mg daily, gabapentin 300 mg 3 times daily, and  trazodone 50 mg nightly per psychiatry.  Will be adding on Ambien as above to help with his insomnia.  Will also refer him to see a therapist as he has had difficulty finding one that accepts his insurance.  Will defer further management to his psychiatrist.     Subjective:  HPI:  See A/P for status of chronic conditions.  Patient is here today for follow-up.  We last saw him via virtual visit about 4 months ago.  He has been following with psychiatry for medication management for anxiety and depression over the last several months.  He does feel like this is going well however over the last several weeks he has had ongoing issues with insomnia.  He has been under a lot of stress recently due to significant social issues.  He is currently homeless but living with a friend.  He is looking for more permanent housing solution however this has been difficult.  He has been trying to get in contact with social work to look for potential placement.  He is also been having some legal issues related to failing to appear that a court date due to not having transportation to court.  He is working with a Clinical research associate to help have this resolved.  His primary concern today is difficulty with falling asleep.  He has been working with a psychiatrist for this however they have not found a solution.  He does think that his stress and anxiety is making it worse as well as his housing situation.  He has previously been  on Ambien in the past and has done well with this.  He is interested in trying this again.  He is currently working to find a therapist to help him with his psychiatric issues as well.  He does admit to sometimes feeling like he would be better off dead however has no active SI or HI.     09/10/2023    1:07 PM 05/08/2023    8:53 AM 12/14/2022    2:36 PM 12/07/2022    2:38 PM 12/07/2022    2:36 PM  Depression screen PHQ 2/9  Decreased Interest 3 0 3 0 0  Down, Depressed, Hopeless 3 0 3 0 0  PHQ - 2 Score 6 0 6 0  0  Altered sleeping 1 0 2 0   Tired, decreased energy 3 0 3 0   Change in appetite 3 0 3 0   Feeling bad or failure about yourself  2 0 3 0   Trouble concentrating 3 0 3 0   Moving slowly or fidgety/restless 1 0 3 0   Suicidal thoughts 1 0 1 0   PHQ-9 Score 20 0 24 0   Difficult doing work/chores Somewhat difficult Not difficult at all Extremely dIfficult            Objective:  Physical Exam: BP (!) 141/79   Pulse (!) 57   Temp 97.8 F (36.6 C) (Temporal)   Ht 6' (1.829 m)   Wt 269 lb (122 kg)   SpO2 98%   BMI 36.48 kg/m   Gen: No acute distress, resting comfortably CV: Regular rate and rhythm with no murmurs appreciated Pulm: Normal work of breathing, clear to auscultation bilaterally with no crackles, wheezes, or rhonchi Neuro: Grossly normal, moves all extremities Psych: Normal affect and thought content      Vendela Troung M. [name], MD 09/10/2023 1:29 PM

## 2023-09-10 NOTE — Assessment & Plan Note (Signed)
Following with psychiatry.  This is contributing to his above insomnia.  We will continue his current medications as prescribed by psychiatry however we will be adding on Ambien as above to help with his sleep.  He is working with social work to help with housing placement and working with a Clinical research associate for his legal issues.  He has had some trouble with finding a therapist and will work with his insurance-will place referral today.  Will defer further management to psychiatry.  We discussed reasons to return to care.

## 2023-09-13 ENCOUNTER — Telehealth: Payer: Self-pay | Admitting: Family Medicine

## 2023-09-13 ENCOUNTER — Other Ambulatory Visit: Payer: Self-pay | Admitting: *Deleted

## 2023-09-13 MED ORDER — GABAPENTIN 300 MG PO CAPS: 300.0000 mg | ORAL_CAPSULE | Freq: Three times a day (TID) | ORAL | 3 refills | Status: DC

## 2023-09-13 NOTE — Telephone Encounter (Signed)
Prescription Request  09/13/2023  LOV: 09/10/2023  What is the name of the medication or equipment? gabapentin (NEURONTIN) 300 MG capsule   Have you contacted your pharmacy to request a refill? Yes   Which pharmacy would you like this sent to?  [id number] [place], [place] - [address] AT [facility] [address] [place] [address] Phone: [phone] Fax: [phone]    Patient notified that their request is being sent to the clinical staff for review and that they should receive a response within 2 business days.   Please advise at Mobile [phone] (mobile)

## 2023-09-13 NOTE — Telephone Encounter (Signed)
Rx send to pharmacy

## 2023-09-20 ENCOUNTER — Telehealth: Payer: Self-pay | Admitting: Licensed Clinical Social Worker

## 2023-09-20 NOTE — Patient Outreach (Deleted)
  Care Coordination   09/20/2023 Name: Omar Krause MRN: [id number] DOB: 1991-09-26   Care Coordination Outreach Attempts:  An unsuccessful telephone outreach was attempted today to offer the patient information about available care coordination services.  Follow Up Plan:  Additional outreach attempts will be made to offer the patient care coordination information and services.   Encounter Outcome:  No Answer   Care Coordination Interventions:  No, not indicated    Jenel Lucks, MSW, LCSW [facility] Care Management [place]  [facility] [email or web address] Phone [phone] 3:27 PM

## 2023-09-20 NOTE — Patient Instructions (Signed)
Visit Information  Thank you for taking time to visit with me today. Please don't hesitate to contact me if I can be of assistance to you.   Following are the goals we discussed today:   Goals Addressed             This Visit's Progress    Obtain Supportive Resources-Housing   On track    Activities and task to complete in order to accomplish goals.   Continue with compliance of taking medication prescribed by Doctor. F/up with [facility] Implement healthy coping skills discussed to assist with management of symptoms Continue working with [facility] care team to assist with goals identified Complete disability application at ssa.gov          Please call the care guide team at [phone] if you need to cancel or reschedule your appointment.   If you are experiencing a Mental Health or Behavioral Health Crisis or need someone to talk to, please call the Suicide and Crisis Lifeline: 988 call 911   Patient verbalizes understanding of instructions and care plan provided today and agrees to view in MyChart. Active MyChart status and patient understanding of how to access instructions and care plan via MyChart confirmed with patient.     Jenel Lucks, MSW, LCSW [facility] Care Management [place]  [facility] [email or web address] Phone [phone] 4:06 PM

## 2023-09-20 NOTE — Patient Outreach (Signed)
  Care Coordination   Follow Up Visit Note   09/20/2023 Name: Omar Krause MRN: [id number] DOB: Apr 24, 1991  Omar Krause is a 32 y.o. year old male who sees Ardith Dark, MD for primary care. I spoke with  Omar Krause by phone today.  What matters to the patients health and wellness today?  Housing and Symptom Management    Goals Addressed             This Visit's Progress    Obtain Supportive Resources-Housing   On track    Activities and task to complete in order to accomplish goals.   Continue with compliance of taking medication prescribed by Doctor. F/up with [facility] Implement healthy coping skills discussed to assist with management of symptoms Continue working with [facility] care team to assist with goals identified Complete disability application at ssa.gov          SDOH assessments and interventions completed:  Yes  SDOH Interventions Today    Flowsheet Row Most Recent Value  SDOH Interventions   Housing Interventions Other (Comment)  [Patient is working with a CM with [place] to obtain a Housing CM through [employer] Interventions Intervention Not Indicated, Payor Benefit        Care Coordination Interventions:  Yes, provided  Interventions Today    Flowsheet Row Most Recent Value  Chronic Disease   Chronic disease during today's visit Other  [GAD and MDD]  General Interventions   General Interventions Discussed/Reviewed General Interventions Reviewed, [facility], Doctor Visits  [Patient has upcoming Psychiatry intake on 11/30 with [facility]. Medicaid transporataion currently allows service animal to accompany pt during rides. LCSW will make attempts to contact [employer] regarding assignment of a housing CM]  Mental Health Interventions   Mental Health Discussed/Reviewed Mental Health Reviewed, Coping Strategies, Anxiety, Depression  Pharmacy Interventions   Pharmacy Dicussed/Reviewed Pharmacy  Topics Reviewed, Medication Adherence  Safety Interventions   Safety Discussed/Reviewed Safety Reviewed       Follow up plan: Follow up call scheduled for 1-2 weeks    Encounter Outcome:  Patient Visit Completed   Jenel Lucks, MSW, LCSW [facility] Care Management [facility]  [facility] [email or web address] Phone [phone] 4:06 PM

## 2023-09-23 ENCOUNTER — Telehealth: Payer: Self-pay | Admitting: Family Medicine

## 2023-09-23 NOTE — Telephone Encounter (Signed)
Recommend he discuss this with his psychiatrist however alternative include ramelteon or belsomra.  [name]. [name], MD 09/23/2023 3:14 PM

## 2023-09-23 NOTE — Telephone Encounter (Signed)
Pt states he has been having side effects on  zolpidem (AMBIEN) 10 MG tablet  Like sleepwalking and would like to know if there is an alternative. Please advise.

## 2023-09-23 NOTE — Telephone Encounter (Signed)
Please advise 

## 2023-09-25 NOTE — Telephone Encounter (Signed)
Left message to return call to our office at their convenience.  

## 2023-09-26 NOTE — Telephone Encounter (Signed)
Left message to return call to our office at their convenience.  

## 2023-09-26 NOTE — Telephone Encounter (Signed)
Patient returned call. Requests to be called. 

## 2023-10-01 NOTE — Telephone Encounter (Signed)
LVM for patient to return call with cb number

## 2023-10-03 NOTE — Telephone Encounter (Signed)
Spoke with patient stated he talk to his psychiatrist start taking Trazodone and helping a little bit  Will call his psychiatrist  for refills if needed,

## 2023-10-26 ENCOUNTER — Telehealth: Payer: Medicaid Other | Admitting: Family Medicine

## 2023-10-26 DIAGNOSIS — F321 Major depressive disorder, single episode, moderate: Secondary | ICD-10-CM

## 2023-10-26 DIAGNOSIS — F411 Generalized anxiety disorder: Secondary | ICD-10-CM | POA: Diagnosis not present

## 2023-10-26 DIAGNOSIS — F4310 Post-traumatic stress disorder, unspecified: Secondary | ICD-10-CM | POA: Diagnosis not present

## 2023-10-26 MED ORDER — PROPRANOLOL HCL ER 60 MG PO CP24: 60.0000 mg | ORAL_CAPSULE | Freq: Every day | ORAL | 0 refills | Status: DC

## 2023-10-26 MED ORDER — OLANZAPINE 5 MG PO TABS: 5.0000 mg | ORAL_TABLET | Freq: Every day | ORAL | 0 refills | Status: DC

## 2023-10-26 MED ORDER — TRAZODONE HCL 50 MG PO TABS: 50.0000 mg | ORAL_TABLET | Freq: Every day | ORAL | 0 refills | Status: DC

## 2023-10-26 NOTE — Patient Instructions (Signed)

## 2023-10-26 NOTE — Progress Notes (Signed)
 Virtual Visit Consent   Omar Krause, you are scheduled for a virtual visit with a [facility] provider today. Just as with appointments in the office, your consent must be obtained to participate. Your consent will be active for this visit and any virtual visit you may have with one of our providers in the next 365 days. If you have a MyChart account, a copy of this consent can be sent to you electronically.  As this is a virtual visit, video technology does not allow for your provider to perform a traditional examination. This may limit your provider's ability to fully assess your condition. If your provider identifies any concerns that need to be evaluated in person or the need to arrange testing (such as labs, EKG, etc.), we will make arrangements to do so. Although advances in technology are sophisticated, we cannot ensure that it will always work on either your end or our end. If the connection with a video visit is poor, the visit may have to be switched to a telephone visit. With either a video or telephone visit, we are not always able to ensure that we have a secure connection.  By engaging in this virtual visit, you consent to the provision of healthcare and authorize for your insurance to be billed (if applicable) for the services provided during this visit. Depending on your insurance coverage, you may receive a charge related to this service.  I need to obtain your verbal consent now. Are you willing to proceed with your visit today? Omar Krause has provided verbal consent on 10/26/2023 for a virtual visit (video or telephone). Omar Haq, FNP  Date: 10/26/2023 4:53 PM   I will only be able to send your documented dosage of meds for 1 week. I am not able to find documentation of you psychiatry visit in your my chart. Please contact your psychiatrist Monday. Take care!  Thank you,  [name]  Virtual Visit via Video Note   I, Omar Krause, connected with  Omar Krause   ([id number], 01/04/1991) on 10/26/23 at  4:30 PM EST by a video-enabled telemedicine application and verified that I am speaking with the correct person using two identifiers.  Location: Patient: Virtual Visit Location Patient: Home Provider: Virtual Visit Location Provider: Home Office   I discussed the limitations of evaluation and management by telemedicine and the availability of in person appointments. The patient expressed understanding and agreed to proceed.    History of Present Illness: Omar Krause is a 33 y.o. who identifies as a male who was assigned male at birth, and is being seen today for depression. Ran out of meds 2 days ago. Sx are worsening. He reports his psychiatrist doubled his olazapine, trazodone  and propranolol  but did not send rx in. I am not able to find that documentation in chart. He denies suicidal thoughts but says he does not want to reach that point. Has appmt with psych this coming Friday.   HPI: HPI  Problems:  Patient Active Problem List   Diagnosis Date Noted   Cervical radiculopathy 05/08/2023   Constipation 08/04/2021   GERD (gastroesophageal reflux disease) 08/04/2021   PTSD (post-traumatic stress disorder) 07/16/2018   Carpal tunnel syndrome 05/02/2018   Depression, major, single episode, moderate (HCC) 03/31/2018   Insomnia 03/12/2016   Marijuana use 03/12/2016   Asthma, mild intermittent 09/14/2015   HTN (hypertension) 02/02/2014   Generalized anxiety disorder 02/02/2014   Morbid obesity (HCC) 07/08/2013    Allergies:  Allergies  Allergen Reactions   Ceftin [Cefuroxime Axetil] Itching and Other (See Comments)    Dizziness as well   Escitalopram  Nausea And Vomiting   Prednisone      Worsens anxiety, causes panic attacks and insomnia.    Medications:  Current Outpatient Medications:    gabapentin  (NEURONTIN ) 300 MG capsule, Take 1 capsule (300 mg total) by mouth 3 (three) times daily., Disp: 270 capsule, Rfl: 3    losartan -hydrochlorothiazide  (HYZAAR) 100-25 MG tablet, Take 1 tablet by mouth daily., Disp: 90 tablet, Rfl: 3   Multiple Vitamin (MULTIVITAMIN WITH MINERALS) TABS tablet, Take 1 tablet by mouth daily., Disp: , Rfl:    OLANZapine  (ZYPREXA ) 5 MG tablet, Take 5 mg by mouth daily., Disp: , Rfl:    propranolol  ER (INDERAL  LA) 60 MG 24 hr capsule, TAKE 1 CAPSULE BY MOUTH EVERY DAY, Disp: 90 capsule, Rfl: 3   traZODone  (DESYREL ) 50 MG tablet, Take 50 mg by mouth at bedtime as needed., Disp: , Rfl:    venlafaxine  XR (EFFEXOR -XR) 75 MG 24 hr capsule, Take 75 mg by mouth daily., Disp: , Rfl:    zolpidem  (AMBIEN ) 10 MG tablet, Take 1 tablet (10 mg total) by mouth at bedtime as needed for sleep., Disp: 15 tablet, Rfl: 1  Observations/Objective: Patient is well-developed, well-nourished in no acute distress.  Resting comfortably  at home.  Head is normocephalic, atraumatic.  No labored breathing.  Speech is clear and coherent with logical content.  Patient is alert and oriented at baseline.    Assessment and Plan: 1. Generalized anxiety disorder (Primary)  2. PTSD (post-traumatic stress disorder)  3. Depression, major, single episode, moderate (HCC)  Will send 7 days of meds at documented dose. Call psych Monday. ED if sx worsen.   Follow Up Instructions: I discussed the assessment and treatment plan with the patient. The patient was provided an opportunity to ask questions and all were answered. The patient agreed with the plan and demonstrated an understanding of the instructions.  A copy of instructions were sent to the patient via MyChart unless otherwise noted below.     The patient was advised to call back or seek an in-person evaluation if the symptoms worsen or if the condition fails to improve as anticipated.    [name], FNP

## 2023-10-28 ENCOUNTER — Ambulatory Visit: Payer: Self-pay | Admitting: Family Medicine

## 2023-10-28 NOTE — Telephone Encounter (Signed)
 Agree with dispo. Recommend he follow up with psychiatry soon.  [name]. [name], MD 10/28/2023 1:12 PM

## 2023-10-28 NOTE — Telephone Encounter (Signed)
 Chief Complaint: Medication withdrawal, sever anxiety Symptoms: Nightmares, vomiting, anxiety Frequency: Ongoing since Thursday Pertinent Negatives: Patient denies CP, SOB Disposition: [x] ED /[] Urgent Care (no appt availability in office) / [] Appointment(In office/virtual)/ []  [facility]/ [] Home Care/ [] Refused Recommended Disposition /[] [facility]/ []  Follow-up with PCP Additional Notes: Pt reports he took last dose of Zyprexa  Thursday and has since been experiencing severe nightmares, one in which caused him to urinate on himself in his sleep last night. He reports vomiting every morning upon awaking and believes it is from the severe night terrors he has been experiencing. He reports poor appetite but has been able to eat when needed, denies SI/HI. Reports his living environment is volatile as he is homeless and staying with friends. This RN offered to call police per pt's statements regarding roommates behavior and pt declined. Pt advised he had previously called psych office to see about getting a refill until his appt time next week but reports it was never sent and he has had problems with his pharmacy. This RN advised pt to be seen in ED now d/t to medication withdrawal symptoms and inability to get his medication prior to next week's appt. Pt verbalized understanding and agrees to plan. This RN offered to call transportation for pt to ED, pt advises he lives within walking distance and will go now. Pt reports he was previously taking Prazosin  for symptoms and requests a script, advised this RN would forward message to provider but advised this also may be something that can/will be prescribed by psych provider if deemed appropriate at upcoming appt.   Copied from CRM [id number]. Topic: Clinical - Red Word Triage >> Oct 28, 2023 10:26 AM Laymon HERO wrote: Reason for CRM:   Patient has been having night terrors, He has woke up the past few days shaking. He has not had his  medications for a few days due to not having an appt with his psychiatrist Doctor until next week. He feels as though he is having withdrawals from not having the medication. Reason for Disposition  [1] SEVERE anxiety (e.g., extremely anxious with intense emotional symptoms such as feeling of unreality, urge to flee, unable to calm down; unable to cope or function) AND [2] not better after 10 minutes of reassurance and Care Advice  Answer Assessment - Initial Assessment Questions 1. CONCERN: Did anything happen that prompted you to call today?      Feeling overwhelmed, anxious, without medication 2. ANXIETY SYMPTOMS: Can you describe how you (your loved one; patient) have been feeling? (e.g., tense, restless, panicky, anxious, keyed up, overwhelmed, sense of impending doom).      Anxious, overwhelmed, severe nightmares 3. ONSET: How long have you been feeling this way? (e.g., hours, days, weeks)     Since Thursday, been without Zyprexa  4. SEVERITY: How would you rate the level of anxiety? (e.g., 0 - 10; or mild, moderate, severe).     Severe 5. FUNCTIONAL IMPAIRMENT: How have these feelings affected your ability to do daily activities? Have you had more difficulty than usual doing your normal daily activities? (e.g., getting better, same, worse; self-care, school, work, interactions)     Haven't been able to do much, just sit around and worry 6. HISTORY: Have you felt this way before? Have you ever been diagnosed with an anxiety problem in the past? (e.g., generalized anxiety disorder, panic attacks, PTSD). If Yes, ask: How was this problem treated? (e.g., medicines, counseling, etc.)     PTSD, major  depression, GAD, agoraphobia 7. RISK OF HARM - SUICIDAL IDEATION: Do you ever have thoughts of hurting or killing yourself? If Yes, ask:  Do you have these feelings now? Do you have a plan on how you would do this?     None 8. TREATMENT:  What has been done so far to treat  this anxiety? (e.g., medicines, relaxation strategies). What has helped?     Medications per psych 9. TREATMENT - THERAPIST: Do you have a counselor or therapist? Name?     Waiting on referral 10. POTENTIAL TRIGGERS: Do you drink caffeinated beverages (e.g., coffee, colas, teas), and how much daily? Do you drink alcohol or use any drugs? Have you started any new medicines recently?       Caffeine/Nicotine , has been without medication since Thursday 11. PATIENT SUPPORT: Who is with you now? Who do you live with? Do you have family or friends who you can talk to?        Homeless, chaotic living environment 12. OTHER SYMPTOMS: Do you have any other symptoms? (e.g., feeling depressed, trouble concentrating, trouble sleeping, trouble breathing, palpitations or fast heartbeat, chest pain, sweating, nausea, or diarrhea)       Trouble sleeping, vomiting every morning, urinated on himself in his sleep, heart pounding  Protocols used: Anxiety and Panic Attack-A-AH

## 2023-10-29 ENCOUNTER — Telehealth: Payer: Self-pay | Admitting: Licensed Clinical Social Worker

## 2023-10-29 DIAGNOSIS — R079 Chest pain, unspecified: Secondary | ICD-10-CM | POA: Insufficient documentation

## 2023-10-29 DIAGNOSIS — R072 Precordial pain: Secondary | ICD-10-CM | POA: Insufficient documentation

## 2023-10-29 DIAGNOSIS — E876 Hypokalemia: Secondary | ICD-10-CM | POA: Insufficient documentation

## 2023-10-29 DIAGNOSIS — T50902A Poisoning by unspecified drugs, medicaments and biological substances, intentional self-harm, initial encounter: Secondary | ICD-10-CM | POA: Insufficient documentation

## 2023-10-29 NOTE — Patient Outreach (Signed)
  Care Coordination   Follow Up Visit Note   10/29/2023 Name: Omar Krause MRN: [id number] DOB: 11-05-1990  Omar Krause is a 32 y.o. year old male who sees [name] HERO, MD for primary care. I spoke with  Omar Krause by phone today.  What matters to the patients health and wellness today?  Housing and Symptom Management    Goals Addressed             This Visit's Progress    Obtain Supportive Resources-Housing   On track    Activities and task to complete in order to accomplish goals.   Continue with compliance of taking medication prescribed by Doctor. F/up with [facility] Implement healthy coping skills discussed to assist with management of symptoms Continue working with [facility] care team to assist with goals identified Complete disability application at ssa.gov Continue attempts to f/up with Housing Coordinator with [employer], Ms Baker Livings,  [phone]          SDOH assessments and interventions completed:  Yes  SDOH Interventions Today    Flowsheet Row Most Recent Value  SDOH Interventions   Housing Interventions Other (Comment)  [Patient is currently residing with a friend. LCSW and pt will continue to make attempts to collaborate with Housing Coordinator through [employer]]        Care Coordination Interventions:  Yes, provided  Interventions Today    Flowsheet Row Most Recent Value  Chronic Disease   Chronic disease during today's visit Hypertension (HTN), Other  [MDD, GAD, PTSD]  General Interventions   General Interventions Discussed/Reviewed General Interventions Reviewed, [facility], Doctor Visits  Doctor Visits Discussed/Reviewed Doctor Visits Reviewed  Mental Health Interventions   Mental Health Discussed/Reviewed Mental Health Reviewed, Coping Strategies  [Patient continues to participate in med management, upcoming appt on Friday, 01/10. LCSW strongly encouraged pt to speak with provider about establishing with counselor to  address with stress mangement while attempting to obtain independent housing]  Safety Interventions   Safety Discussed/Reviewed Safety Reviewed       Follow up plan: Follow up call scheduled for 2-4 weeks    Encounter Outcome:  Patient Visit Completed   [name], MSW, LCSW [facility] Care Management [facility] Health  [facility] [email or web address] Phone [phone] 3:17 PM

## 2023-10-29 NOTE — Patient Instructions (Signed)
 Visit Information  Thank you for taking time to visit with me today. Please don't hesitate to contact me if I can be of assistance to you.   Following are the goals we discussed today:   Goals Addressed             This Visit's Progress    Obtain Supportive Resources-Housing   On track    Activities and task to complete in order to accomplish goals.   Continue with compliance of taking medication prescribed by Doctor. F/up with [facility] Implement healthy coping skills discussed to assist with management of symptoms Continue working with [facility] care team to assist with goals identified Complete disability application at ssa.gov Continue attempts to f/up with Housing Coordinator with [employer], Ms Baker Livings,  [phone]          Our next appointment is by telephone on 1/22 at 3:30 PM  Please call the care guide team at [phone] if you need to cancel or reschedule your appointment.   If you are experiencing a Mental Health or Behavioral Health Crisis or need someone to talk to, please call the Suicide and Crisis Lifeline: 988 call 911   Patient verbalizes understanding of instructions and care plan provided today and agrees to view in MyChart. Active MyChart status and patient understanding of how to access instructions and care plan via MyChart confirmed with patient.     Starlee Corralejo, MSW, LCSW [facility] Care Management [place]  [facility] [email or web address] Phone [phone] 3:18 PM

## 2023-10-29 NOTE — Telephone Encounter (Signed)
 Left message to return call to our office at their convenience.

## 2023-11-01 ENCOUNTER — Telehealth: Payer: Self-pay | Admitting: Licensed Clinical Social Worker

## 2023-11-01 NOTE — Patient Outreach (Signed)
  Care Coordination   Follow Up Visit Note   11/01/2023 Name: Omar Krause MRN: [id number] DOB: December 25, 1990  Omar Krause is a 33 y.o. year old male who sees [name] HERO, MD for primary care. I spoke with  Omar Krause by phone today.  What matters to the patients health and wellness today?  Symptom Management, Crisis Intervention, Housing    Goals Addressed             This Visit's Progress    Obtain Supportive Resources-Housing   On track    Activities and task to complete in order to accomplish goals.   Continue with compliance of taking medication prescribed by Doctor. Attend scheduled f/up appt with Psychiatrist today at 4:30 PM Implement healthy coping skills discussed to assist with management of symptoms. Utilize crisis intervention resources discussed Continue working with [facility] care team to assist with goals identified Complete disability application at ssa.gov Continue attempts to f/up with Housing Coordinator with [employer], Ms Baker Livings,  [phone] Attend weekly staff meeting at [facility] to inquire about emergency shelter placement/housing coordination needs          SDOH assessments and interventions completed:  No     Care Coordination Interventions:  Yes, provided  Interventions Today    Flowsheet Row Most Recent Value  Chronic Disease   Chronic disease during today's visit Other  [Housing Instability, MDD, GAD, PTSD]  General Interventions   General Interventions Discussed/Reviewed General Interventions Reviewed, Community Resources, Doctor Visits  Doctor Visits Discussed/Reviewed Doctor Visits Reviewed  [Pt was recently d/c from [facility] due to SI]  Mental Health Interventions   Mental Health Discussed/Reviewed Mental Health Reviewed, Coping Strategies, Anxiety, Depression, Suicide  Pharmacy Interventions   Pharmacy Dicussed/Reviewed Pharmacy Topics Reviewed, Medication Adherence  [Strongly encouraged to  obtain psych meds to assist with management of symptoms. Has appt with psychiatrist today at 4:30 PM and will discuss any barriers to care]  Safety Interventions   Safety Discussed/Reviewed Safety Reviewed       Follow up plan: Follow up call scheduled for 1 week    Encounter Outcome:  Patient Visit Completed   [name], MSW, LCSW [facility] Care Management [facility] Health  [facility] [email or web address] Phone [phone] 10:11 AM

## 2023-11-01 NOTE — Patient Instructions (Signed)
 Visit Information  Thank you for taking time to visit with me today. Please don't hesitate to contact me if I can be of assistance to you.   Following are the goals we discussed today:   Goals Addressed             This Visit's Progress    Obtain Supportive Resources-Housing   On track    Activities and task to complete in order to accomplish goals.   Continue with compliance of taking medication prescribed by Doctor. Attend scheduled f/up appt with Psychiatrist today at 4:30 PM Implement healthy coping skills discussed to assist with management of symptoms. Utilize crisis intervention resources discussed Continue working with [facility] care team to assist with goals identified Complete disability application at ssa.gov Continue attempts to f/up with Housing Coordinator with [employer], Ms Baker Livings,  [phone] Attend weekly staff meeting at [facility] to inquire about emergency shelter placement/housing coordination needs          Our next appointment is by telephone on 1/14 at 2 PM  Please call the care guide team at [phone] if you need to cancel or reschedule your appointment.   If you are experiencing a Mental Health or Behavioral Health Crisis or need someone to talk to, please call the Suicide and Crisis Lifeline: 988 call 911   Patient verbalizes understanding of instructions and care plan provided today and agrees to view in MyChart. Active MyChart status and patient understanding of how to access instructions and care plan via MyChart confirmed with patient.     Larue Lightner, MSW, LCSW [facility] Care Management [place]  [facility] [email or web address] Phone [phone] 10:12 AM

## 2023-11-05 ENCOUNTER — Ambulatory Visit: Payer: Self-pay | Admitting: Licensed Clinical Social Worker

## 2023-11-08 NOTE — Patient Outreach (Signed)
  Care Coordination   Collaboration  Visit Note   11/05/2023 Name: Omar Krause MRN: [id number] DOB: 1990/12/18  Omar Krause is a 33 y.o. year old male who sees Omar Krause HERO, MD for primary care. I  engaged with Omar Krause, CM at [employer]  What matters to the patients health and wellness today?  Pt was not engaged during this encounter.    SDOH assessments and interventions completed:  No     Care Coordination Interventions:  Yes, provided  Interventions Today    Flowsheet Row Most Recent Value  Chronic Disease   Chronic disease during today's visit Other  [Housing Instability, MDD, PTSD, GAD]  General Interventions   General Interventions Discussed/Reviewed [facility], Communication with  Communication with Social Work  APPLE COMPUTER collaborated with Omar Krause from [employer] regarding housing options and their program to assist pt with re-housing and/or emergency housing]  Mental Health Interventions   Mental Health Discussed/Reviewed Mental Health Discussed, Coping Strategies       Follow up plan:  LCSW will f/up with pt    Encounter Outcome:  Patient Visit Completed   Omar Kerns, LCSW [place]  [facility], [facility] Clinical Social Worker Direct Dial: [phone]  Fax: [phone] Website: [email or web address] 8:10 AM

## 2023-11-08 NOTE — Patient Instructions (Signed)
Visit Information  Thank you for taking time to visit with me today. Please don't hesitate to contact me if I can be of assistance to you before our next scheduled telephone appointment.  Following are the goals we discussed today:  (Copy and paste patient goals from clinical care plan here)  Our next appointment is by telephone on 1/22 at 1:30 PM  Please call the care guide team at [phone] if you need to cancel or reschedule your appointment.   If you are experiencing a Mental Health or Behavioral Health Crisis or need someone to talk to, please call the Suicide and Crisis Lifeline: 988 call 911   Patient verbalizes understanding of instructions and care plan provided today and agrees to view in MyChart. Active MyChart status and patient understanding of how to access instructions and care plan via MyChart confirmed with patient.     [facility]  [facility], [facility] Clinical Social Worker Direct Dial: [phone]  Fax: [phone] Website: [email or web address] 8:10 AM

## 2023-11-13 ENCOUNTER — Ambulatory Visit: Payer: Self-pay | Admitting: Licensed Clinical Social Worker

## 2023-11-14 NOTE — Patient Outreach (Signed)
  Care Coordination   Follow Up Visit Note   11/13/2023 Name: Omar Krause MRN: [id number] DOB: October 18, 1991  Omar Krause is a 33 y.o. year old male who sees Ardith Dark, MD for primary care. I spoke with  Omar Krause by phone today.  What matters to the patients health and wellness today?  Symptom Management, Housing    Goals Addressed             This Visit's Progress    Obtain Supportive Resources-Housing   On track    Activities and task to complete in order to accomplish goals.   Continue with compliance of taking medication prescribed by Doctor. Attend scheduled f/up appt with Psychiatrist on 1/24 and discuss referral to Counselor Implement healthy coping skills discussed to assist with management of symptoms. Utilize crisis intervention resources discussed Continue working with [facility] care team to assist with goals identified Complete disability application at ssa.gov Continue attempts to f/up with Housing Coordinator with [employer] Garin, Ms Lennon Alstrom,  [phone] ext [phone] Attend weekly staff meeting on 2/5 at [facility] to inquire about emergency shelter placement/housing coordination needs and discuss plan to assist with management of MH symptoms while in program          SDOH assessments and interventions completed:  No     Care Coordination Interventions:  Yes, provided  Interventions Today    Flowsheet Row Most Recent Value  Chronic Disease   Chronic disease during today's visit Other  [MDD, GAD, PTSD, Housing Instability]  General Interventions   General Interventions Discussed/Reviewed General Interventions Reviewed, Community Resources, Doctor Visits  Doctor Visits Discussed/Reviewed Doctor Visits Reviewed, Specialist  Mental Health Interventions   Mental Health Discussed/Reviewed Mental Health Reviewed, Coping Strategies, Anxiety, Depression  [Pt is currently staying with another friend, no concerns of having to leave in the next week  or two. Will f/up with Bethseda on 2/5 to discuss housing services]  Pharmacy Interventions   Pharmacy Dicussed/Reviewed Pharmacy Topics Reviewed, Medication Adherence  Safety Interventions   Safety Discussed/Reviewed Safety Reviewed       Follow up plan: Follow up call scheduled for 1-2 weeks    Encounter Outcome:  Patient Visit Completed   Jenel Lucks, LCSW [place]  [facility], [facility] Clinical Social Worker Direct Dial: [phone]  Fax: [phone] Website: [email or web address] 4:26 PM

## 2023-11-14 NOTE — Patient Instructions (Signed)
Visit Information  Thank you for taking time to visit with me today. Please don't hesitate to contact me if I can be of assistance to you.   Following are the goals we discussed today:   Goals Addressed             This Visit's Progress    Obtain Supportive Resources-Housing   On track    Activities and task to complete in order to accomplish goals.   Continue with compliance of taking medication prescribed by Doctor. Attend scheduled f/up appt with Psychiatrist on 1/24 and discuss referral to Counselor Implement healthy coping skills discussed to assist with management of symptoms. Utilize crisis intervention resources discussed Continue working with [facility] care team to assist with goals identified Complete disability application at ssa.gov Continue attempts to f/up with Housing Coordinator with [employer] Garin, Ms Lennon Alstrom,  [phone] ext [phone] Attend weekly staff meeting on 2/5 at [facility] to inquire about emergency shelter placement/housing coordination needs and discuss plan to assist with management of MH symptoms while in program          Our next appointment is by telephone on 1/29 at 3:30 PM  Please call the care guide team at [phone] if you need to cancel or reschedule your appointment.   If you are experiencing a Mental Health or Behavioral Health Crisis or need someone to talk to, please call the Suicide and Crisis Lifeline: 988 call 911   Patient verbalizes understanding of instructions and care plan provided today and agrees to view in MyChart. Active MyChart status and patient understanding of how to access instructions and care plan via MyChart confirmed with patient.     [facility]indy [facility]  [facility], [facility] Clinical Social Worker Direct Dial: [phone]  Fax: [phone] Website: [email or web address] 4:26 PM

## 2023-11-20 ENCOUNTER — Ambulatory Visit: Payer: Self-pay | Admitting: Licensed Clinical Social Worker

## 2023-11-21 ENCOUNTER — Telehealth: Payer: Medicaid Other | Admitting: Family Medicine

## 2023-11-22 NOTE — Patient Instructions (Signed)
Visit Information  Thank you for taking time to visit with me today. Please don't hesitate to contact me if I can be of assistance to you.   Following are the goals we discussed today:   Goals Addressed             This Visit's Progress    Obtain Supportive Resources-Housing   On track    Activities and task to complete in order to accomplish goals.   Continue with compliance of taking medication prescribed by Doctor. Attend scheduled f/up appt with Psychiatrist on 1/24 and discuss referral to Counselor Implement healthy coping skills discussed to assist with management of symptoms. Utilize crisis intervention resources discussed Continue working with [facility] care team to assist with goals identified Complete disability application at ssa.gov Continue attempts to f/up with Housing Coordinator with [employer] Garin, Ms Lennon Alstrom,  [phone] ext [phone] Attend weekly staff meeting on 2/5 at [facility] to inquire about emergency shelter placement/housing coordination needs and discuss plan to assist with management of MH symptoms while in program          Please call the care guide team at [phone] if you need to cancel or reschedule your appointment.   If you are experiencing a Mental Health or Behavioral Health Crisis or need someone to talk to, please call the Suicide and Crisis Lifeline: 988 call 911   Patient verbalizes understanding of instructions and care plan provided today and agrees to view in MyChart. Active MyChart status and patient understanding of how to access instructions and care plan via MyChart confirmed with patient.     The patient will call LCSW as advised after staff meeting with [facility]  Windy [facility]  [facility], [facility] Clinical Social Worker Direct Dial: [phone]  Fax: [phone] Website: [email or web address] 5:47 AM

## 2023-11-22 NOTE — Patient Outreach (Signed)
  Care Coordination   Follow Up Visit Note   11/20/2023 Name: Omar Krause MRN: [id number] DOB: Jun 23, 1991  Omar Krause is a 33 y.o. year old male who sees Ardith Dark, MD for primary care. I spoke with  Omar Krause by phone today.  What matters to the patients health and wellness today?  Housing and Symptom Management    Goals Addressed             This Visit's Progress    Obtain Supportive Resources-Housing   On track    Activities and task to complete in order to accomplish goals.   Continue with compliance of taking medication prescribed by Doctor. Attend scheduled f/up appt with Psychiatrist on 1/24 and discuss referral to Counselor Implement healthy coping skills discussed to assist with management of symptoms. Utilize crisis intervention resources discussed Continue working with [facility] care team to assist with goals identified Complete disability application at ssa.gov Continue attempts to f/up with Housing Coordinator with Lafe Garin, Ms Lennon Alstrom,  [phone] ext [phone] Attend weekly staff meeting on 2/5 at [facility] to inquire about emergency shelter placement/housing coordination needs and discuss plan to assist with management of MH symptoms while in program          SDOH assessments and interventions completed:  No  SDOH Interventions Today    Flowsheet Row Most Recent Value  SDOH Interventions   Housing Interventions Community Resources Provided  [LCSW provided housing resources in [place]]        Care Coordination Interventions:  Yes, provided  Interventions Today    Flowsheet Row Most Recent Value  Chronic Disease   Chronic disease during today's visit Other  [MDD, GAD, PTSD, Housing Instability]  General Interventions   General Interventions Discussed/Reviewed General Interventions Reviewed, [facility], Doctor Visits  [LCSW provided pt with housing resources in [place]]  Doctor Visits Discussed/Reviewed Doctor Visits Reviewed  Mental Health Interventions   Mental Health Discussed/Reviewed Mental Health Reviewed, Coping Strategies, Anxiety, Depression  [Due to unforseen circumstances, pt and [name] is homeless. They do have safe places to go during the day. Pt agreed to f/up with emergency housing and staff meeting with Angelina Pih on 2/5]  Nutrition Interventions   Nutrition Discussed/Reviewed Nutrition Reviewed  Pharmacy Interventions   Pharmacy Dicussed/Reviewed Pharmacy Topics Reviewed, Medication Adherence  Safety Interventions   Safety Discussed/Reviewed Safety Reviewed       Follow up plan: Follow up call scheduled for 1-2 weeks    Encounter Outcome:  Patient Visit Completed   Jenel Lucks, LCSW [place]  [facility], [facility] Clinical Social Worker Direct Dial: [phone]  Fax: [phone] Website: [email or web address] 5:47 AM

## 2023-11-26 ENCOUNTER — Encounter: Payer: Self-pay | Admitting: Family Medicine

## 2023-11-26 ENCOUNTER — Telehealth: Payer: Medicaid Other | Admitting: Family Medicine

## 2023-11-26 MED ORDER — PROPRANOLOL HCL ER 60 MG PO CP24: 60.0000 mg | ORAL_CAPSULE | Freq: Every day | ORAL | 5 refills | Status: DC

## 2023-11-26 NOTE — Telephone Encounter (Signed)
Please see the note I sent him. I refilled his propranolol. Was there any other urgent things he needed?  [name]. [name], MD 11/26/2023 3:23 PM

## 2023-11-26 NOTE — Progress Notes (Signed)
 Patient initially scheduled for virtual visit for blood pressure follow-up.  We were able to connect successfully however having difficulty with audio.  We attempted to transition to a telephonic visit however we were not able to get in contact after multiple attempts were made to call his listed preferred phone number.  We did send a MyChart message asking patient to schedule appointment time that is convenient for him (preferably in person).  Worth HERO. Kennyth, MD 11/26/2023 1:45 PM

## 2023-11-26 NOTE — Addendum Note (Signed)
Addended by: Ardith Dark on: 11/26/2023 02:01 PM   Modules accepted: Orders

## 2023-11-26 NOTE — Telephone Encounter (Signed)
 See note

## 2023-11-27 ENCOUNTER — Ambulatory Visit: Payer: Medicaid Other | Admitting: Family Medicine

## 2023-12-16 ENCOUNTER — Ambulatory Visit: Payer: Medicaid Other | Admitting: Family Medicine

## 2023-12-24 ENCOUNTER — Telehealth: Payer: Self-pay | Admitting: Licensed Clinical Social Worker

## 2023-12-24 NOTE — Patient Instructions (Signed)
 Visit Information  Thank you for taking time to visit with me today. Please don't hesitate to contact me if I can be of assistance to you.   Following are the goals we discussed today:   Goals Addressed             This Visit's Progress    Obtain Supportive Resources-Housing   On track    Activities and task to complete in order to accomplish goals.   Continue with compliance of taking medication prescribed by Doctor. Implement healthy coping skills discussed to assist with management of symptoms. Utilize crisis intervention resources discussed Complete disability application at ssa.gov           Our next appointment is by telephone on 3/13 at 12:30 PM  Please call the care guide team at [phone] if you need to cancel or reschedule your appointment.   If you are experiencing a Mental Health or Behavioral Health Crisis or need someone to talk to, please call the Suicide and Crisis Lifeline: 988 call 911   Patient verbalizes understanding of instructions and care plan provided today and agrees to view in MyChart. Active MyChart status and patient understanding of how to access instructions and care plan via MyChart confirmed with patient.     Windy [facility]  [facility], [facility] Clinical Social Worker Direct Dial: [phone]  Fax: [phone] Website: [email or web address] 4:42 PM

## 2023-12-24 NOTE — Patient Outreach (Signed)
 Care Coordination   Follow Up Visit Note   12/23/2023 Name: Omar Krause MRN: [id number] DOB: 20-Feb-1991  Omar Krause is a 34 y.o. year old male who sees Omar Dark, MD for primary care. I spoke with  Omar Krause by phone today.  What matters to the patients health and wellness today?  Transportation, Symptom Management, Housing    Goals Addressed             This Visit's Progress    Obtain Supportive Resources-Housing   On track    Activities and task to complete in order to accomplish goals.   Continue with compliance of taking medication prescribed by Doctor. Implement healthy coping skills discussed to assist with management of symptoms. Utilize crisis intervention resources discussed Complete disability application at ssa.gov           SDOH assessments and interventions completed:  Yes  SDOH Interventions Today    Flowsheet Row Most Recent Value  SDOH Interventions   Housing Interventions Intervention Not Indicated, Other (Comment)  [Patient is currently placed at [facility]. He has completed Rapid Re-Housing Intake Assessment and continues to work with shelter staff to secure independent housing]        Care Coordination Interventions:  Yes, provided  Interventions Today    Flowsheet Row Most Recent Value  Chronic Disease   Chronic disease during today's visit Hypertension (HTN), Other  [PTSD, GAD, MDD]  General Interventions   General Interventions Discussed/Reviewed General Interventions Reviewed, [facility], Doctor Visits  [Transportation assessed. Pt reports instances where Medicaid didn't pick him up for medical appts.]  Doctor Visits Discussed/Reviewed Doctor Visits Reviewed  Mental Health Interventions   Mental Health Discussed/Reviewed Mental Health Reviewed, Coping Strategies  [Pt has been placed at [facility] 11/27/23. He and [name] are adjusting well despite changes in rules (no smoking from 11 PM to  5 AM) Strategies to assist with urges discussed and will f/up with PCP as needed. Has job interview today]  Safety Interventions   Safety Discussed/Reviewed Safety Reviewed       Follow up plan: Follow up call scheduled for 4 weeks    Encounter Outcome:  Patient Visit Completed   Jenel Lucks, LCSW [place]  [facility], [facility] Clinical Social Worker Direct Dial: [phone]  Fax: [phone] Website: [email or web address] 4:40 PM

## 2023-12-25 ENCOUNTER — Ambulatory Visit: Payer: Medicaid Other | Admitting: Family Medicine

## 2023-12-27 ENCOUNTER — Telehealth: Admitting: Family Medicine

## 2023-12-27 VITALS — Ht 72.0 in | Wt 270.0 lb

## 2023-12-27 DIAGNOSIS — I10 Essential (primary) hypertension: Secondary | ICD-10-CM

## 2023-12-27 DIAGNOSIS — F411 Generalized anxiety disorder: Secondary | ICD-10-CM | POA: Diagnosis not present

## 2023-12-27 DIAGNOSIS — F4310 Post-traumatic stress disorder, unspecified: Secondary | ICD-10-CM | POA: Diagnosis not present

## 2023-12-27 MED ORDER — PRAZOSIN HCL 1 MG PO CAPS: 1.0000 mg | ORAL_CAPSULE | Freq: Every day | ORAL | 3 refills | Status: DC

## 2023-12-27 MED ORDER — LOSARTAN POTASSIUM 100 MG PO TABS: 100.0000 mg | ORAL_TABLET | Freq: Every day | ORAL | 3 refills | Status: DC

## 2023-12-27 MED ORDER — CLONAZEPAM 1 MG PO TABS: 1.0000 mg | ORAL_TABLET | Freq: Two times a day (BID) | ORAL | 1 refills | Status: DC | PRN

## 2023-12-27 NOTE — Assessment & Plan Note (Signed)
 Not controlled.  Recently worsened.  Will be restarting his prazosin as above per patient request.  This will also help with his blood pressure readings.  He will follow-up with me next week we can adjust as needed.  He will be seeing a psychiatrist again in a couple months and he can discuss records and as well.

## 2023-12-27 NOTE — Assessment & Plan Note (Signed)
 Home readings are not at goal.  He is having more urinary frequency with HCTZ as well and would like to discontinue this.  He is having more issues with anxiety and PTSD and would like to restart his prazosin.  We will start 1 mg nightly on this.  He will take HCTZ off of his medications and continue with losartan 100 mg daily alone.  He will continue his propranolol 60 mg daily.  He will send me a message early next week and we can adjust medications as needed.

## 2023-12-27 NOTE — Assessment & Plan Note (Signed)
 Following with psychiatry though symptoms are not controlled well at this point.  He will be seeing them in a couple of months.  He will continue Zyprexa 10 mg daily, gabapentin 300 mg 3 times daily, Remeron 15 mg daily, and Effexor 75 mg daily.  Will give refill of his clonazepam today.  This was last refilled about a year ago.  Tolerates well and does help with managing his anxiety.  Will defer further management to psychiatry.

## 2023-12-27 NOTE — Progress Notes (Signed)
   Omar Krause is a 33 y.o. male who presents today for a virtual office visit.  Assessment/Plan:  New/Acute Problems: Urinary frequency Discussed limitations of virtual visit and inability to perform exam or any testing.  Likely is due to his HCTZ.  We did discuss that if his symptoms did not improve with discontinuing this he will need to come in for a visit for urinalysis and labs including A1c.  He voiced understanding.  Chronic Problems Addressed Today: Primary hypertension Home readings are not at goal.  He is having more urinary frequency with HCTZ as well and would like to discontinue this.  He is having more issues with anxiety and PTSD and would like to restart his prazosin.  We will start 1 mg nightly on this.  He will take HCTZ off of his medications and continue with losartan 100 mg daily alone.  He will continue his propranolol 60 mg daily.  He will send me a message early next week and we can adjust medications as needed.  Generalized anxiety disorder Following with psychiatry though symptoms are not controlled well at this point.  He will be seeing them in a couple of months.  He will continue Zyprexa 10 mg daily, gabapentin 300 mg 3 times daily, Remeron 15 mg daily, and Effexor 75 mg daily.  Will give refill of his clonazepam today.  This was last refilled about a year ago.  Tolerates well and does help with managing his anxiety.  Will defer further management to psychiatry.  PTSD (post-traumatic stress disorder) Not controlled.  Recently worsened.  Will be restarting his prazosin as above per patient request.  This will also help with his blood pressure readings.  He will follow-up with me next week we can adjust as needed.  He will be seeing a psychiatrist again in a couple months and he can discuss records and as well.     Subjective:  HPI:  See Assessment / plan for status of chronic conditions.  He is still having elevated blood pressure readings into the 150's at  home. He having more urinary frequency as well.  He believes it is due to the HCTZ.  Has been under little more stress recently.  He is currently working as a Therapist, sports but cannot get into see them for a couple more months.  He is currently on olanzapine 10 mg daily and Remeron 15 mg daily.  He is interested in restarting short course of clonazepam.  Currently living in homeless shelter.       Objective/Observations  Physical Exam: Gen: NAD, resting comfortably Pulm: Normal work of breathing Neuro: Grossly normal, moves all extremities Psych: Normal affect and thought content  Virtual Visit via Video   I connected with Teven Mittman Krause on 12/27/23 at  7:40 AM EST by a video enabled telemedicine application and verified that I am speaking with the correct person using two identifiers. The limitations of evaluation and management by telemedicine and the availability of in person appointments were discussed. The patient expressed understanding and agreed to proceed.   Patient location: Home Provider location: [place] [employer] Persons participating in the virtual visit: Myself and Patient     [name]. [name], MD 12/27/2023 8:05 AM

## 2023-12-31 ENCOUNTER — Telehealth: Payer: Self-pay

## 2023-12-31 NOTE — Telephone Encounter (Signed)
 Good Morning, we have received your message, could you please tell us when you started the Klonopin? Thanks!

## 2023-12-31 NOTE — Telephone Encounter (Signed)
 Has he contacted his psychiatrist?  We can try switching to Valium if needed.

## 2023-12-31 NOTE — Telephone Encounter (Signed)
 Copied from CRM [id number]. Topic: Clinical - Medication Question >> Dec 31, 2023  8:48 AM Fonda Kinder J wrote: Reason for CRM: Pt states that he was prescribed clonazePAM (KLONOPIN) 1 MG tablet, to help with his sleeping but they are not working. He states the medication is actually keeping him awake and he wants to know if there are any alternatives?

## 2024-01-02 ENCOUNTER — Other Ambulatory Visit: Payer: Self-pay | Admitting: Family Medicine

## 2024-01-02 ENCOUNTER — Encounter: Payer: Self-pay | Admitting: Licensed Clinical Social Worker

## 2024-01-02 MED ORDER — DIAZEPAM 5 MG PO TABS: 5.0000 mg | ORAL_TABLET | Freq: Two times a day (BID) | ORAL | 0 refills | Status: DC | PRN

## 2024-01-02 NOTE — Telephone Encounter (Unsigned)
 Copied from CRM [id number]. Topic: Clinical - Prescription Issue >> Jan 02, 2024  4:04 PM Denese Killings wrote: Reason for CRM: Patient prescription for diazepam (VALIUM) 5 MG tablet was called in to the wrong pharmacy. Prescription need top be sent to [facility] [address].

## 2024-01-02 NOTE — Addendum Note (Signed)
 Addended by: Ardith Dark on: 01/02/2024 03:14 PM   Modules accepted: Orders

## 2024-01-03 ENCOUNTER — Telehealth: Payer: Self-pay | Admitting: Family Medicine

## 2024-01-03 ENCOUNTER — Telehealth: Payer: Self-pay | Admitting: Licensed Clinical Social Worker

## 2024-01-03 MED ORDER — DIAZEPAM 5 MG PO TABS: 5.0000 mg | ORAL_TABLET | Freq: Two times a day (BID) | ORAL | 0 refills | Status: DC | PRN

## 2024-01-03 NOTE — Patient Outreach (Signed)
 Care Coordination   01/02/2024 Name: Omar Krause MRN: [id number] DOB: November 11, 1990   Care Coordination Outreach Attempts:  An unsuccessful outreach was attempted for an appointment today.  Follow Up Plan:  Additional outreach attempts will be made to offer the patient complex care management information and services.   Encounter Outcome:  No Answer   Care Coordination Interventions:  No, not indicated    Jenel Lucks, LCSW [place]  [facility], [facility] Clinical Social Worker Direct Dial: [phone]  Fax: [phone] Website: [email or web address] 5:09 PM

## 2024-01-03 NOTE — Telephone Encounter (Signed)
 Copied from CRM [id number]. Topic: Clinical - Medication Question >> Jan 03, 2024  3:07 PM Tiffany S wrote: Reason for CRM: Levert Feinstein from [place] requesting call back. Needs clarification on  diazepam (VALIUM) 5 MG tablet [[id number]]  Levert Feinstein [phone]

## 2024-01-07 NOTE — Telephone Encounter (Signed)
 Please see call note from pharmacy

## 2024-01-07 NOTE — Telephone Encounter (Signed)
 Called [facility] pharmacy at [phone], stated issue was solve patient pick up Rx Valium last week

## 2024-01-29 ENCOUNTER — Other Ambulatory Visit: Payer: Self-pay | Admitting: Family Medicine

## 2024-02-10 ENCOUNTER — Ambulatory Visit: Admitting: Family Medicine

## 2024-02-12 ENCOUNTER — Other Ambulatory Visit: Payer: Self-pay | Admitting: Family Medicine

## 2024-02-13 ENCOUNTER — Ambulatory Visit: Admitting: Family Medicine

## 2024-02-18 ENCOUNTER — Ambulatory Visit: Admitting: Family Medicine

## 2024-02-19 ENCOUNTER — Telehealth: Payer: Self-pay | Admitting: *Deleted

## 2024-02-19 NOTE — Telephone Encounter (Signed)
 Copied from CRM [id number]. Topic: Clinical - Medication Question >> Feb 19, 2024 10:42 AM Martinique E wrote: Reason for CRM: Tulla from the [facility] pharmacy called in regards to the patient's diazepam  (VALIUM ) 5 MG tablet. Tulla stated how the patient has been going back and forth between Clonazepam  and Diazepam  and she would like to know the reasoning behind the constant switching of medications. Callback number [phone] to discuss. Thanks.    Please advise  Mylin Gignac,RMA

## 2024-02-20 NOTE — Telephone Encounter (Signed)
 We tried clonazepam  last month however switched him back to diazepam .  He should not be getting any refills on clonazepam .  Recommend they remove this from their list at the pharmacy.

## 2024-02-20 NOTE — Telephone Encounter (Signed)
 Called [facility] pharmacy at [phone] Clonazepam  deleted from his medication record

## 2024-02-25 ENCOUNTER — Telehealth: Payer: Self-pay | Admitting: *Deleted

## 2024-02-25 NOTE — Telephone Encounter (Signed)
 Copied from CRM [id number]. Topic: Clinical - Medication Question >> Feb 25, 2024 10:30 AM Dyann Glaser G wrote: Reason for CRM: PT STATED HE IS WANTING TO STOP SMOKING AND WOULD LIKE TO BE PUT BACK ON THE PREVIOUS MEDS TO HELP HIM STOP SMOKING    Patient need an office visit with PCP  Horald Lyme

## 2024-02-27 NOTE — Telephone Encounter (Signed)
 Noted.

## 2024-03-19 ENCOUNTER — Ambulatory Visit: Admitting: Family Medicine

## 2024-03-27 ENCOUNTER — Ambulatory Visit: Admitting: Family Medicine

## 2024-04-01 ENCOUNTER — Encounter: Payer: Self-pay | Admitting: Licensed Clinical Social Worker

## 2024-04-10 ENCOUNTER — Telehealth: Payer: Self-pay | Admitting: Licensed Clinical Social Worker

## 2024-04-17 ENCOUNTER — Ambulatory Visit: Admitting: Family Medicine

## 2024-04-17 ENCOUNTER — Encounter: Payer: Self-pay | Admitting: Family Medicine

## 2024-04-17 VITALS — BP 138/78 | HR 67 | Temp 97.3°F | Ht 72.0 in | Wt 303.0 lb

## 2024-04-17 DIAGNOSIS — F4310 Post-traumatic stress disorder, unspecified: Secondary | ICD-10-CM

## 2024-04-17 DIAGNOSIS — I10 Essential (primary) hypertension: Secondary | ICD-10-CM | POA: Diagnosis not present

## 2024-04-17 DIAGNOSIS — E559 Vitamin D deficiency, unspecified: Secondary | ICD-10-CM

## 2024-04-17 DIAGNOSIS — F321 Major depressive disorder, single episode, moderate: Secondary | ICD-10-CM | POA: Diagnosis not present

## 2024-04-17 DIAGNOSIS — Z131 Encounter for screening for diabetes mellitus: Secondary | ICD-10-CM | POA: Diagnosis not present

## 2024-04-17 DIAGNOSIS — Z13220 Encounter for screening for lipoid disorders: Secondary | ICD-10-CM

## 2024-04-17 DIAGNOSIS — R350 Frequency of micturition: Secondary | ICD-10-CM | POA: Diagnosis not present

## 2024-04-17 DIAGNOSIS — F411 Generalized anxiety disorder: Secondary | ICD-10-CM

## 2024-04-17 DIAGNOSIS — R079 Chest pain, unspecified: Secondary | ICD-10-CM

## 2024-04-17 DIAGNOSIS — L299 Pruritus, unspecified: Secondary | ICD-10-CM | POA: Diagnosis not present

## 2024-04-17 LAB — URINALYSIS, ROUTINE W REFLEX MICROSCOPIC
Bilirubin Urine: NEGATIVE
Hgb urine dipstick: NEGATIVE
Ketones, ur: NEGATIVE
Leukocytes,Ua: NEGATIVE
Nitrite: NEGATIVE
Specific Gravity, Urine: 1.015 (ref 1.000–1.030)
Total Protein, Urine: NEGATIVE
Urine Glucose: NEGATIVE
Urobilinogen, UA: 0.2 (ref 0.0–1.0)
pH: 7 (ref 5.0–8.0)

## 2024-04-17 LAB — VITAMIN B12: Vitamin B-12: 327 pg/mL (ref 211–911)

## 2024-04-17 LAB — COMPREHENSIVE METABOLIC PANEL WITH GFR
ALT: 19 U/L (ref 0–53)
AST: 16 U/L (ref 0–37)
Albumin: 4.6 g/dL (ref 3.5–5.2)
Alkaline Phosphatase: 57 U/L (ref 39–117)
BUN: 15 mg/dL (ref 6–23)
CO2: 31 meq/L (ref 19–32)
Calcium: 9.3 mg/dL (ref 8.4–10.5)
Chloride: 102 meq/L (ref 96–112)
Creatinine, Ser: 1.21 mg/dL (ref 0.40–1.50)
GFR: 78.52 mL/min (ref >60.00)
Glucose, Bld: 85 mg/dL (ref 70–99)
Potassium: 4 meq/L (ref 3.5–5.1)
Sodium: 139 meq/L (ref 135–145)
Total Bilirubin: 0.3 mg/dL (ref 0.2–1.2)
Total Protein: 7.2 g/dL (ref 6.0–8.3)

## 2024-04-17 LAB — CBC
HCT: 40.9 % (ref 39.0–52.0)
Hemoglobin: 13.8 g/dL (ref 13.0–17.0)
MCHC: 33.8 g/dL (ref 30.0–36.0)
MCV: 86.5 fl (ref 78.0–100.0)
Platelets: 247 10*3/uL (ref 150.0–400.0)
RBC: 4.73 Mil/uL (ref 4.22–5.81)
RDW: 13.2 % (ref 11.5–15.5)
WBC: 7.3 10*3/uL (ref 4.0–10.5)

## 2024-04-17 LAB — LIPID PANEL
Cholesterol: 184 mg/dL (ref 0–200)
HDL: 28.8 mg/dL — ABNORMAL LOW (ref >39.00)
LDL Cholesterol: 113 mg/dL — ABNORMAL HIGH (ref 0–99)
NonHDL: 155.07
Total CHOL/HDL Ratio: 6
Triglycerides: 210 mg/dL — ABNORMAL HIGH (ref 0.0–149.0)
VLDL: 42 mg/dL — ABNORMAL HIGH (ref 0.0–40.0)

## 2024-04-17 LAB — TSH: TSH: 2.33 u[IU]/mL (ref 0.35–5.50)

## 2024-04-17 LAB — HEMOGLOBIN A1C: Hgb A1c MFr Bld: 5.4 % (ref 4.6–6.5)

## 2024-04-17 LAB — VITAMIN D 25 HYDROXY (VIT D DEFICIENCY, FRACTURES): VITD: 21.2 ng/mL — ABNORMAL LOW (ref 30.00–100.00)

## 2024-04-17 LAB — FOLATE: Folate: 12.6 ng/mL (ref >5.9)

## 2024-04-17 MED ORDER — PRAZOSIN HCL 1 MG PO CAPS: 2.0000 mg | ORAL_CAPSULE | Freq: Every day | ORAL | 0 refills | Status: DC

## 2024-04-17 MED ORDER — PANTOPRAZOLE SODIUM 40 MG PO TBEC: 40.0000 mg | DELAYED_RELEASE_TABLET | Freq: Every day | ORAL | 3 refills | Status: DC

## 2024-04-17 MED ORDER — DIAZEPAM 5 MG PO TABS: 5.0000 mg | ORAL_TABLET | Freq: Two times a day (BID) | ORAL | 0 refills | Status: DC | PRN

## 2024-04-17 MED ORDER — GABAPENTIN 300 MG PO CAPS: 300.0000 mg | ORAL_CAPSULE | Freq: Three times a day (TID) | ORAL | 0 refills | Status: DC

## 2024-04-17 NOTE — Assessment & Plan Note (Signed)
 At goal today.  We stopped HCTZ a few months ago and has done well with this.  Will continue his propranolol  60 mg daily and losartan  100 mg daily.  We are increasing his dose of prazosin  as above which may lower his blood pressure.  He will follow-up with us  in a few weeks.

## 2024-04-17 NOTE — Assessment & Plan Note (Signed)
 Doing somewhat better with the prazosin .  He is also following with psychiatry for management for his anxiety and depression.  We will increase prazosin  to 2 mg daily.  He is aware of potential side effects.  He will follow-up with us  in a few weeks and we can adjust as tolerated.

## 2024-04-17 NOTE — Patient Instructions (Signed)
 It was very nice to see you today!  I think your chest pain is probably due to reflux. Please start the Protonix .  Follow-up with us  in couple weeks let us  know how this is working.  We will check blood work and a urine sample today. For your itching and urinary issues.  It is okay for you to try Claritin or Zyrtec  to help with the itching.  I will refill your medications.  Please increase your prazosin  to 2 mg nightly and let us  know how this is working in a few weeks.  Return in about 3 months (around 07/18/2024) for Follow Up.   Take care, Dr Kennyth  PLEASE NOTE:  If you had any lab tests, please let us  know if you have not heard back within a few days. You may see your results on mychart before we have a chance to review them but we will give you a call once they are reviewed by us .   If we ordered any referrals today, please let us  know if you have not heard from their office within the next week.   If you had any urgent prescriptions sent in today, please check with the pharmacy within an hour of our visit to make sure the prescription was transmitted appropriately.   Please try these tips to maintain a healthy lifestyle:  Eat at least 3 REAL meals and 1-2 snacks per day.  Aim for no more than 5 hours between eating.  If you eat breakfast, please do so within one hour of getting up.   Each meal should contain half fruits/vegetables, one quarter protein, and one quarter carbs (no bigger than a computer mouse)  Cut down on sweet beverages. This includes juice, soda, and sweet tea.   Drink at least 1 glass of water with each meal and aim for at least 8 glasses per day  Exercise at least 150 minutes every week.
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
## 2024-04-17 NOTE — Assessment & Plan Note (Signed)
 Continue management per psychiatry.  He is on Zyprexa  5 mg daily, venlafaxine  75 mg daily, gabapentin  300 mg 3 times daily, and trazodone  50 mg nightly per psychiatry.

## 2024-04-17 NOTE — Assessment & Plan Note (Signed)
 Follows with psychiatry.  He is currently on Zyprexa  10 mg daily, gabapentin  300 mg 3 times daily, and Effexor  75 mg daily.  Also uses Valium  5 mg twice daily as needed.  He needs an emergency refill on his gabapentin  and Valium  today.  Database was reviewed without red flags.  He has contacted his psychiatrist however he will not be able to see them for a couple more weeks.  Will refill both of these that we will defer further management to his psychiatrist.  Tolerating his current regimen well without side effects and his symptoms are overall stable on his current regimen.

## 2024-04-18 LAB — URINE CULTURE
MICRO NUMBER:: 16634339
Result:: NO GROWTH
SPECIMEN QUALITY:: ADEQUATE

## 2024-04-21 ENCOUNTER — Ambulatory Visit: Payer: Self-pay | Admitting: Family Medicine

## 2024-04-21 NOTE — Progress Notes (Signed)
 His labs are significant for low vitamin D and borderline elevated cholesterol.  The rest of his labs are all negative.  Recommend he start 5000 IUs of vitamin D daily.  He can get this over-the-counter.  We should recheck in 3 to 6 months.  For his cholesterol he should continue to work on diet and exercise and we can recheck in a year.  No obvious explanation for his frequent urination based on his labs.  If he is still having ongoing issues with this we can refer him to a urologist for further testing  Do not need to make any other changes to his treatment plan at this time.  I would like for him to follow-up with us  with the medication changes we made in a couple of weeks as we discussed at his office visit.

## 2024-04-26 ENCOUNTER — Encounter: Payer: Self-pay | Admitting: Family Medicine

## 2024-04-27 MED ORDER — VITAMIN D (ERGOCALCIFEROL) 1.25 MG (50000 UNIT) PO CAPS: 50000.0000 [IU] | ORAL_CAPSULE | ORAL | 2 refills | Status: AC

## 2024-04-27 NOTE — Addendum Note (Signed)
 Addended by: THURMON ARLAND PARAS on: 04/27/2024 12:32 PM   Modules accepted: Orders

## 2024-04-27 NOTE — Telephone Encounter (Signed)
 I appreciate the update.   It is difficult for me to say if those symptoms are due to the Protonix .  He can try off of it for a couple weeks to see if this improves his symptoms.  It is okay to send in a vitamin D  prescription 50,000 weekly.  We should recheck again in 3 to 6 months.  I will draft a letter regarding for his MyChart account for this chronic disease management.

## 2024-05-01 ENCOUNTER — Other Ambulatory Visit: Payer: Self-pay | Admitting: Licensed Clinical Social Worker

## 2024-05-01 ENCOUNTER — Telehealth: Payer: Self-pay | Admitting: *Deleted

## 2024-05-01 MED ORDER — NICOTINE 21 MG/24HR TD PT24: 21.0000 mg | MEDICATED_PATCH | Freq: Every day | TRANSDERMAL | 0 refills | Status: DC

## 2024-05-01 NOTE — Patient Instructions (Signed)
 Visit Information  Mr. Tonkinson was given information about Medicaid Managed Care team care coordination services as a part of their [employer] Medicaid benefit. Danetta Elisabeth III verbally consentedto engagement with the [facility] Managed Care team.   If you are experiencing a medical emergency, please call 911 or report to your local emergency department or urgent care.   If you have a non-emergency medical problem during routine business hours, please contact your provider's office and ask to speak with a nurse.   For questions related to your [employer] [facility] health plan, please call: [phone]  OR visit the member homepage at: [email or web address]  If you would like to schedule transportation through your [facility] plan, please call the following number at least 2 days in advance of your appointment: [phone]  If you are experiencing a behavioral health crisis, call the [employer] [facility]  Behavioral Health Crisis Line at [phone]). The line is available 24 hours a day, seven days a week.  If you would like help to quit smoking, call 1-800-QUIT-NOW ((743)268-3251) OR Espaol: 1-855-Djelo-Ya (8-144-664-6430) o para ms informacin haga clic aqu or Text READY to 799-599 to register via text   Mr. Hilliker - following are the goals we discussed in your visit today:    Goals Addressed             This Visit's Progress    COMPLETED: Obtain Supportive Resources-Housing       Activities and task to complete in order to accomplish goals.   Continue with compliance of taking medication prescribed by Doctor. Implement healthy coping skills discussed to assist with management of symptoms. Utilize crisis intervention resources discussed Complete disability application at ssa.gov        VBCI Social Work Care Plan   On track    Problems:   Disease Management support and  education needs related to Depression: anxiety and PTSD  CSW Clinical Goal(s):   Over the next 90 days the Patient will attend all scheduled medical appointments as evidenced by patient report and care team review of appointment completion in electronic MEDICAL RECORD NUMBER  demonstrate a reduction in symptoms related to Depression: anxiety PTSD .  Interventions:  Mental Health:  Evaluation of current treatment plan related to Depression: anxiety and PTSD Active listening / Reflection utilized Financial risk analyst / information provided Emotional Support Provided Mindfulness or Relaxation training provided Participation in counseling encourage : Pt's psychiatrist made referral to counseling Quality of sleep assessed & Sleep Hygiene techniques promoted Reviewed mental health medications and discussed importance of compliance: Pt reports compliance  Patient Goals/Self-Care Activities:  Connect with provider for ongoing mental health treatment.   Continue taking your medication as prescribed.   Increase coping skills, healthy habits, and stress reduction  Plan:   Telephone follow up appointment with care management team member scheduled for:  4 weeks        Please see education materials related to topics discussed provided by MyChart link.  Patient verbalizes understanding of instructions and care plan provided today and agrees to view in MyChart. Active MyChart status and patient understanding of how to access instructions and care plan via MyChart confirmed with patient.     Licensed Clinical Social Worker will 08/08 at 10 AM  [name], LCSW [place]  [facility], [facility] Clinical Social Worker Direct Dial: [phone]  Fax: [phone] Website: [email or web address] 10:56 AM   Following is a copy of your plan of care:  There  are no care plans that you recently modified to display for this patient.

## 2024-05-01 NOTE — Telephone Encounter (Signed)
 Copied from CRM [id number]. Topic: General - Other >> May 01, 2024 12:43 PM Aisha D wrote: Reason for CRM: Pt stated that he is wanting to quit smoking and would for Dr.Parker to prescribe a nicotine  patch. Pt would like a callback regarding this request.

## 2024-05-01 NOTE — Telephone Encounter (Signed)
 Patient aware  Will let us  know how he is doing in a couple of weeks

## 2024-05-01 NOTE — Patient Outreach (Signed)
 Complex Care Management   Visit Note  05/01/2024  Name:  Omar Krause MRN: [id number] DOB: June 01, 1991  Situation: Referral received for Complex Care Management related to Mental/Behavioral Health diagnosis Depression, Anxiety, PTSD I obtained verbal consent from Patient.  Visit completed with pt  on the phone  Background:   Past Medical History:  Diagnosis Date   Anxiety    Asthma    No inhaler at home, has not had issues since he was child   Depression    Hypertension    Morbid obesity (HCC)    Nausea    Palpitations    Seizures (HCC)     Assessment: Patient Reported Symptoms:  Cognitive Cognitive Status: No symptoms reported, Alert and oriented to person, place, and time, Normal speech and language skills Cognitive/Intellectual Conditions Management [RPT]: None reported or documented in medical history or problem list      Neurological Neurological Review of Symptoms: No symptoms reported    HEENT HEENT Symptoms Reported: No symptoms reported      Cardiovascular Cardiovascular Symptoms Reported: Chest pain or discomfort Does patient have uncontrolled Hypertension?: No Cardiovascular Comment: Pt reported that he addressed chest pain with PCP at previous visit, suspects it is related to heartburn and has meds to assist. Symptoms have decreased  Respiratory Respiratory Symptoms Reported: No symptoms reported    Endocrine Endocrine Symptoms Reported: No symptoms reported    Gastrointestinal Gastrointestinal Symptoms Reported: No symptoms reported      Genitourinary Genitourinary Symptoms Reported: No symptoms reported    Integumentary Integumentary Symptoms Reported: No symptoms reported    Musculoskeletal Musculoskelatal Symptoms Reviewed: No symptoms reported   Falls in the past year?: No Number of falls in past year: 1 or less Was there an injury with Fall?: No Fall Risk Category Calculator: 0 Patient Fall Risk Level: Low Fall Risk    Psychosocial  Psychosocial Symptoms Reported: Other Other Psychosocial Conditions: Stress Additional Psychological Details: Patient reports stress triggered by looking for employment. 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 Strategies to assist with stress management discussed. Pt continues to participate in med management with [facility] in [place] Behavioral Management Strategies: Coping strategies Major Change/Loss/Stressor/Fears (CP): Medical condition, self, Environment Techniques to Cope with Loss/Stress/Change: Medication Quality of Family Relationships: non-existent Do you feel physically threatened by others?: No      04/17/2024    2:19 PM  Depression screen PHQ 2/9  Decreased Interest 1  Down, Depressed, Hopeless 2  PHQ - 2 Score 3  Altered sleeping 2  Tired, decreased energy 0  Change in appetite 3  Feeling bad or failure about yourself  3  Trouble concentrating 2  Moving slowly or fidgety/restless 3  Suicidal thoughts 0  PHQ-9 Score 16  Difficult doing work/chores Somewhat difficult    There were no vitals filed for this visit.  Medications Reviewed Today   Medications were not reviewed in this encounter     Recommendation:   Continue Current Plan of Care  Follow Up Plan:   Telephone follow-up in 1 month  [name], LCSW [facility]  [facility], [facility] Clinical Social Worker Direct Dial: [phone]  Fax: [phone] Website: [email or web address] 10:52 AM

## 2024-05-02 ENCOUNTER — Other Ambulatory Visit: Payer: Self-pay | Admitting: Family Medicine

## 2024-05-11 NOTE — Telephone Encounter (Signed)
 Copied from CRM [id number]. Topic: General - Other >> May 11, 2024  3:43 PM Aisha D wrote: Reason for CRM: Pt is requesting another letter stating that his dog helps treats his condition. Pt stated that Dr.Parker sent a letter through MyChart a while back but its no longer there. Pt would like a callback with an update.    ----------------------------------------------------------------------- From previous Reason for Contact - Lab/Test Results: Reason for CRM:   Please advise  Aizlyn Schifano,RMA

## 2024-05-12 ENCOUNTER — Encounter: Payer: Self-pay | Admitting: Family Medicine

## 2024-05-12 NOTE — Telephone Encounter (Signed)
 Letter printed send via mychart

## 2024-05-12 NOTE — Telephone Encounter (Signed)
 Ok to print out letter.  Worth HERO. Kennyth, MD 05/12/2024 12:51 PM

## 2024-05-13 NOTE — Telephone Encounter (Signed)
Letter mail to patient.

## 2024-05-22 ENCOUNTER — Other Ambulatory Visit: Payer: Self-pay

## 2024-05-22 ENCOUNTER — Telehealth: Payer: Self-pay

## 2024-05-22 MED ORDER — PRAZOSIN HCL 2 MG PO CAPS: 2.0000 mg | ORAL_CAPSULE | Freq: Every day | ORAL | 0 refills | Status: DC

## 2024-05-22 NOTE — Telephone Encounter (Signed)
 Copied from CRM [id number]. Topic: Clinical - Medication Question >> May 22, 2024  2:18 PM Aisha D wrote: Reason for CRM: Pt stated that he is almost our of the prazosin  (MINIPRESS ) 1 MG capsule and needs to have it refilled. Pt stated that he was informed by Dr.Parker that he would be increasing the dose to 2 MG  and needs a new prescription sent to the pharmacy. Pt stated that the pharmacy will not fill the medication if it doesn't state its for 2 MG. Pt would like for the new prescription to be sent today if possible and would like a callback with an update on this request.  Per PCP office visit note, states increasing patient to 2mg  dose Prazosin . Sent in new Rx to pharmacy. Nothing further needed at this time.

## 2024-05-24 ENCOUNTER — Other Ambulatory Visit: Payer: Self-pay | Admitting: Family Medicine

## 2024-05-26 ENCOUNTER — Other Ambulatory Visit: Payer: Self-pay | Admitting: Family Medicine

## 2024-05-28 ENCOUNTER — Encounter: Payer: Self-pay | Admitting: Family Medicine

## 2024-05-28 NOTE — Telephone Encounter (Signed)
 We can try belsomra if he is interested in that.  Worth HERO. Kennyth, MD 05/28/2024 3:44 PM  s

## 2024-05-28 NOTE — Telephone Encounter (Signed)
 See note

## 2024-05-29 ENCOUNTER — Other Ambulatory Visit: Payer: Self-pay | Admitting: Licensed Clinical Social Worker

## 2024-05-29 NOTE — Telephone Encounter (Signed)
 Pt called in and confirmed that he is ok to start new med and would like it called into the pharmacy.

## 2024-06-01 MED ORDER — BELSOMRA 10 MG PO TABS: 10.0000 mg | ORAL_TABLET | Freq: Every evening | ORAL | 5 refills | Status: DC | PRN

## 2024-06-01 NOTE — Patient Instructions (Signed)
 Visit Information  Mr. Pulis was given information about Medicaid Managed Care team care coordination services as a part of their [employer] Medicaid benefit.   If you would like to schedule transportation through your [employer] [facility] plan, please call the following number at least 2 days in advance of your appointment: [phone]  If you are experiencing a behavioral health crisis, call the [employer] [place]  Behavioral Health Crisis Line at [phone]). The line is available 24 hours a day, seven days a week.   Mr. Vonada - following are the goals we discussed in your visit today:    Goals Addressed             This Visit's Progress    VBCI Social Work Care Plan   On track    Problems:   Disease Management support and education needs related to Depression: anxiety and PTSD  CSW Clinical Goal(s):   Over the next 90 days the Patient will attend all scheduled medical appointments as evidenced by patient report and care team review of appointment completion in electronic MEDICAL RECORD NUMBER  demonstrate a reduction in symptoms related to Depression: anxiety PTSD .  Interventions:  Mental Health:  Evaluation of current treatment plan related to Depression: anxiety and PTSD Active listening / Reflection utilized Financial risk analyst / information provided Emotional Support Provided Participation in counseling encourage : Pt has a scheduled appt with [facility] next week Problem Solving /Task Center strategies reviewed Reviewed mental health medications and discussed importance of compliance: Pt reports compliance Patient continues to attend job interviews with the assistance of vocational rehab  Patient Goals/Self-Care Activities:  Connect with provider for ongoing mental health treatment.   Continue taking your medication as prescribed.   Increase coping skills, healthy habits, and stress  reduction  Plan:   Telephone follow up appointment with care management team member scheduled for:  4 weeks        Please see education materials related to topics discussed provided by MyChart link.  Patient verbalizes understanding of instructions and care plan provided today and agrees to view in MyChart. Active MyChart status and patient understanding of how to access instructions and care plan via MyChart confirmed with patient.     Rolin Ezzard [facility]  [facility], [facility] Clinical Social Worker Direct Dial: [phone]  Fax: [phone] Website: [email or web address] 7:04 AM   Following is a copy of your plan of care:  There are no care plans that you recently modified to display for this patient.

## 2024-06-01 NOTE — Patient Outreach (Signed)
 Complex Care Management   Visit Note  05/29/2024  Name:  Omar Krause MRN: [id number] DOB: October 13, 1991  Situation: Referral received for Complex Care Management related to Mental/Behavioral Health diagnosis GAD, MDD, PTSD I obtained verbal consent from Patient.  Visit completed with pt  on the phone  Background:   Past Medical History:  Diagnosis Date   Anxiety    Asthma    No inhaler at home, has not had issues since he was child   Depression    Hypertension    Morbid obesity (HCC)    Nausea    Palpitations    Seizures (HCC)     Assessment: Patient Reported Symptoms:  Cognitive Cognitive Status: No symptoms reported, Alert and oriented to person, place, and time, Normal speech and language skills Cognitive/Intellectual Conditions Management [RPT]: None reported or documented in medical history or problem list      Neurological Neurological Review of Symptoms: Not assessed    HEENT HEENT Symptoms Reported: Not assessed      Cardiovascular Cardiovascular Symptoms Reported: Not assessed    Respiratory Respiratory Symptoms Reported: Not assesed    Endocrine Endocrine Symptoms Reported: Not assessed    Gastrointestinal Gastrointestinal Symptoms Reported: Not assessed      Genitourinary Genitourinary Symptoms Reported: Not assessed    Integumentary Integumentary Symptoms Reported: Not assessed    Musculoskeletal Musculoskelatal Symptoms Reviewed: Not assessed        Psychosocial Psychosocial Symptoms Reported: Other Other Psychosocial Conditions: Stress Behavioral Management Strategies: Counseling, Support system, Coping strategies, Activity Major Change/Loss/Stressor/Fears (CP): Medical condition, self, School or job Techniques to [employer] with Loss/Stress/Change: Medication, Counseling        04/17/2024    2:19 PM  Depression screen PHQ 2/9  Decreased Interest 1  Down, Depressed, Hopeless 2  PHQ - 2 Score 3  Altered sleeping 2  Tired, decreased energy 0   Change in appetite 3  Feeling bad or failure about yourself  3  Trouble concentrating 2  Moving slowly or fidgety/restless 3  Suicidal thoughts 0  PHQ-9 Score 16  Difficult doing work/chores Somewhat difficult    There were no vitals filed for this visit.  Medications Reviewed Today     Reviewed by Arlester Keehan D, LCSW (Social Worker) on 06/01/24 at [phone]  Med List Status: <None>   Medication Order Taking? Sig Documenting Provider Last Dose Status Informant  diazepam  (VALIUM ) 5 MG tablet [id number]  TAKE 1 TABLET BY MOUTH EVERY 12 HOURS AS NEEDED FOR ANXIETY [name] HERO, MD  Active   gabapentin  (NEURONTIN ) 300 MG capsule [id number]  Take 1 capsule (300 mg total) by mouth 3 (three) times daily. [name] HERO, MD  Active   losartan  (COZAAR ) 100 MG tablet [id number]  Take 1 tablet (100 mg total) by mouth daily. [name] HERO, MD  Active   Multiple Vitamin (MULTIVITAMIN WITH MINERALS) TABS tablet [id number]  Take 1 tablet by mouth daily. [provider]  Active Self  nicotine  (NICODERM CQ  - DOSED IN MG/24 HOURS) 21 mg/24hr patch [id number]  Place 1 patch (21 mg total) onto the skin daily. [name] HERO, MD  Active   OLANZapine  (ZYPREXA ) 10 MG tablet [id number]  Take 10 mg by mouth at bedtime. [provider]  Active   pantoprazole  (PROTONIX ) 40 MG tablet [id number]  Take 1 tablet (40 mg total) by mouth daily. [name] HERO, MD  Active   prazosin  (MINIPRESS ) 2 MG capsule [id number]  Take 1 capsule (2 mg total) by mouth  at bedtime. [name] HERO, MD  Active   propranolol  ER (INDERAL  LA) 60 MG 24 hr capsule [id number]  Take 1 capsule by mouth once daily Parker, Caleb M, MD  Active   venlafaxine  XR (EFFEXOR -XR) 75 MG 24 hr capsule [id number]  Take 75 mg by mouth daily. [provider]  Active   Vitamin D , Ergocalciferol , (DRISDOL ) 1.25 MG (50000 UNIT) CAPS capsule [id number]  Take 1 capsule (50,000 Units total) by mouth every 7 (seven) days. [name] HERO, MD   Active             Recommendation:   Continue Current Plan of Care  Follow Up Plan:   Telephone follow-up in 1 month  [name], LCSW [facility]  [facility], [facility] Clinical Social Worker Direct Dial: [phone]  Fax: [phone] Website: [email or web address] 6:30 AM

## 2024-06-01 NOTE — Patient Instructions (Signed)
 Visit Information  [name] was given information about Medicaid Managed Care team care coordination services as a part of their [employer] Medicaid benefit.   If you would like to schedule transportation through your [facility] plan, please call the following number at least 2 days in advance of your appointment: [phone]  If you are experiencing a behavioral health crisis, call the [employer] [place]  Behavioral Health Crisis Line at [phone]). The line is available 24 hours a day, seven days a week.   Omar Krause - following are the goals we discussed in your visit today:    Goals Addressed             This Visit's Progress    VBCI Social Work Care Plan   On track    Problems:   Disease Management support and education needs related to Depression: anxiety and PTSD  CSW Clinical Goal(s):   Over the next 90 days the Patient will attend all scheduled medical appointments as evidenced by patient report and care team review of appointment completion in electronic MEDICAL RECORD NUMBER  demonstrate a reduction in symptoms related to Depression: anxiety PTSD .  Interventions:  Mental Health:  Evaluation of current treatment plan related to Depression: anxiety and PTSD Active listening / Reflection utilized Financial risk analyst / information provided Emotional Support Provided Participation in counseling encourage : Pt has a scheduled appt with [facility] next week Problem Solving /Task Center strategies reviewed Reviewed mental health medications and discussed importance of compliance: Pt reports compliance Patient continues to attend job interviews with the assistance of vocational rehab  Patient Goals/Self-Care Activities:  Connect with provider for ongoing mental health treatment.   Continue taking your medication as prescribed.   Increase coping skills, healthy habits, and stress  reduction  Plan:   Telephone follow up appointment with care management team member scheduled for:  4 weeks        Please see education materials related to topics discussed provided by MyChart link.  Patient verbalizes understanding of instructions and care plan provided today and agrees to view in MyChart. Active MyChart status and patient understanding of how to access instructions and care plan via MyChart confirmed with patient.     Licensed Clinical Social Worker will call on 9/12 at 10 AM  Omar Kerns, LCSW   [facility], [facility] Clinical Social Worker Direct Dial: [phone]  Fax: [phone] Website: [email or web address] 6:31 AM   Following is a copy of your plan of care:  There are no care plans that you recently modified to display for this patient.

## 2024-06-03 ENCOUNTER — Other Ambulatory Visit (HOSPITAL_COMMUNITY): Payer: Self-pay

## 2024-06-03 ENCOUNTER — Telehealth: Payer: Self-pay

## 2024-06-03 NOTE — Telephone Encounter (Signed)
 Pharmacy Patient Advocate Encounter   Received notification from CoverMyMeds that prior authorization for Belsomra  10MG  tablets  is required/requested.   Insurance verification completed.   The patient is insured through [facility] .   Per test claim: PA required; PA submitted to above mentioned insurance via Latent Key/confirmation #/EOC [id number] Status is pending

## 2024-06-04 ENCOUNTER — Telehealth: Payer: Self-pay | Admitting: *Deleted

## 2024-06-04 NOTE — Telephone Encounter (Signed)
 Patient notified Rx Belsomra  10MG  tablets has been APPROVED from 06/03/24 to 06/03/25

## 2024-06-04 NOTE — Telephone Encounter (Signed)
 Copied from CRM #[id number]. Topic: Clinical - Prescription Issue >> Jun 04, 2024  2:17 PM Rea ORN wrote: Reason for CRM: Pt called to advise insurance will not pay for Suvorexant  (BELSOMRA ) 10 MG TABS. They want him to take ambien  instead but due to side effects he does not want to take that. If PCP can prescribe something within the same class of ambien , he is willing to try that.  Patient aware PA was approved  Riley Papin,RMA

## 2024-06-04 NOTE — Telephone Encounter (Signed)
 Pharmacy Patient Advocate Encounter  Received notification from [facility] that Prior Authorization for Belsomra  10MG  tablets has been APPROVED from 06/03/24 to 06/03/25   PA #/Case ID/Reference #: [id number]

## 2024-06-08 ENCOUNTER — Telehealth: Payer: Self-pay | Admitting: *Deleted

## 2024-06-08 ENCOUNTER — Other Ambulatory Visit: Payer: Self-pay | Admitting: *Deleted

## 2024-06-08 MED ORDER — NICOTINE 14 MG/24HR TD PT24: 14.0000 mg | MEDICATED_PATCH | Freq: Every day | TRANSDERMAL | 0 refills | Status: DC

## 2024-06-08 NOTE — Telephone Encounter (Signed)
 Recommend he schedule an appointment.   Worth HERO. Kennyth, MD 06/08/2024 3:16 PM

## 2024-06-08 NOTE — Telephone Encounter (Signed)
 Copied from CRM #[id number]. Topic: Clinical - Medication Question >> Jun 08, 2024  2:18 PM Aisha D wrote: Reason for CRM: Pt is calling to speak with Dr.Parker or his nurse in regards to an alternative medication for the Belsomra . Pt stated that the PA was approved but his copay would be $448 and that's too expensive.   Please advise

## 2024-06-08 NOTE — Telephone Encounter (Signed)
 Ok to send in nicotine  patch 14 mg daily.

## 2024-06-08 NOTE — Telephone Encounter (Unsigned)
 Copied from CRM #[id number]. Topic: Clinical - Medication Question >> Jun 05, 2024  3:25 PM Viola F wrote: *2nd request* Patient requested call back regarding an alternative medication for the Suvorexant  (BELSOMRA ) 10 MG TABS

## 2024-06-08 NOTE — Telephone Encounter (Signed)
 Rx Belsomra  was approved by insurance  Ok to send nicotine  patches?

## 2024-06-08 NOTE — Telephone Encounter (Signed)
Send information via mychart  

## 2024-06-12 ENCOUNTER — Encounter: Payer: Self-pay | Admitting: Family Medicine

## 2024-06-12 ENCOUNTER — Ambulatory Visit: Admitting: Family Medicine

## 2024-06-12 VITALS — BP 124/77 | HR 69 | Temp 97.3°F | Ht 72.0 in | Wt 319.0 lb

## 2024-06-12 DIAGNOSIS — F5102 Adjustment insomnia: Secondary | ICD-10-CM

## 2024-06-12 DIAGNOSIS — F411 Generalized anxiety disorder: Secondary | ICD-10-CM

## 2024-06-12 DIAGNOSIS — F321 Major depressive disorder, single episode, moderate: Secondary | ICD-10-CM | POA: Diagnosis not present

## 2024-06-12 MED ORDER — TIRZEPATIDE-WEIGHT MANAGEMENT 2.5 MG/0.5ML ~~LOC~~ SOLN
2.5000 mg | SUBCUTANEOUS | 0 refills | Status: DC
Start: 2024-06-12 — End: 2024-07-02

## 2024-06-12 MED ORDER — CLONAZEPAM 1 MG PO TABS: 1.0000 mg | ORAL_TABLET | Freq: Two times a day (BID) | ORAL | 1 refills | Status: DC | PRN

## 2024-06-12 MED ORDER — ESZOPICLONE 1 MG PO TABS: 1.0000 mg | ORAL_TABLET | Freq: Every evening | ORAL | 5 refills | Status: DC | PRN

## 2024-06-12 NOTE — Assessment & Plan Note (Signed)
 Follows with psychiatry.  On Zyprexa  10 mg daily, venlafaxine  75 mg daily, gabapentin  300 mg 3 times daily, and trazodone  50 mg nightly.

## 2024-06-12 NOTE — Assessment & Plan Note (Addendum)
 Symptoms are not controlled.  Had a lengthy discussion today regarding management options.  We did try sending a prescription for Belsomra  however insurance would not pay for this.  He does have several other comorbid psychiatric conditions which are contributing including anxiety, agoraphobia, PTSD, and depression.  Additionally he is currently living in a homeless shelter which is not conducive for good quality sleep either.  He is currently managed by psychiatry for his anxiety with Zyprexa  10 mg daily, trazodone  50 mg daily, venlafaxine  75 mg nightly, gabapentin  300 mg 3 times daily.  We did discuss alternative options to help him with management for his sleep.  He has had a mixed response to Ambien  in the past.  While taking it at home he did have some issues with sleepwalking however tolerated well during recent hospitalizations.  We did discuss potential alternatives.  Will try Lunesta  1 mg nightly.  He is aware to not take this with any benzos or other sedatives.  He can follow-up with us  in a few weeks via MyChart though he can also discuss further with his psychiatrist and his next office visit with him.

## 2024-06-12 NOTE — Assessment & Plan Note (Signed)
 Stable though symptoms are still not fully controlled.  He is currently on Zyprexa , 10 mg daily, gabapentin  300 mg 3 times daily, and Effexor  75 mg daily.  He does not feel like the Valium  is effective for him any longer and would like to try clonazepam .  He has been on this in the past and did well though he does feel like he lost effectiveness after a while.  Will switch back to clonazepam .  He is aware to not take this with the Valium  or with the Lunesta .  He will follow-up with us  in a few weeks via MyChart I will defer further management to his psychiatrist.  Will also give him a letter which will hopefully expedite him getting out of the homeless shelter as this is likely contributing to several of his psychiatric conditions as well.

## 2024-06-12 NOTE — Progress Notes (Signed)
 Faysal Fenoglio III is a 33 y.o. male who presents today for an office visit.  Assessment/Plan:  Chronic Problems Addressed Today: Insomnia Symptoms are not controlled.  Had a lengthy discussion today regarding management options.  We did try sending a prescription for Belsomra  however insurance would not pay for this.  He does have several other comorbid psychiatric conditions which are contributing including anxiety, agoraphobia, PTSD, and depression.  Additionally he is currently living in a homeless shelter which is not conducive for good quality sleep either.  He is currently managed by psychiatry for his anxiety with Zyprexa  10 mg daily, trazodone  50 mg daily, venlafaxine  75 mg nightly, gabapentin  300 mg 3 times daily.  We did discuss alternative options to help him with management for his sleep.  He has had a mixed response to Ambien  in the past.  While taking it at home he did have some issues with sleepwalking however tolerated well during recent hospitalizations.  We did discuss potential alternatives.  Will try Lunesta  1 mg nightly.  He is aware to not take this with any benzos or other sedatives.  He can follow-up with us  in a few weeks via MyChart though he can also discuss further with his psychiatrist and his next office visit with him.  Generalized anxiety disorder Stable though symptoms are still not fully controlled.  He is currently on Zyprexa , 10 mg daily, gabapentin  300 mg 3 times daily, and Effexor  75 mg daily.  He does not feel like the Valium  is effective for him any longer and would like to try clonazepam .  He has been on this in the past and did well though he does feel like he lost effectiveness after a while.  Will switch back to clonazepam .  He is aware to not take this with the Valium  or with the Lunesta .  He will follow-up with us  in a few weeks via MyChart I will defer further management to his psychiatrist.  Will also give him a letter which will hopefully expedite him  getting out of the homeless shelter as this is likely contributing to several of his psychiatric conditions as well.  Depression, major, single episode, moderate (HCC) Follows with psychiatry.  On Zyprexa  10 mg daily, venlafaxine  75 mg daily, gabapentin  300 mg 3 times daily, and trazodone  50 mg nightly.  Morbid obesity BMI 43 today with comorbidities.  He would be a good candidate for GLP agonist.  We did discuss potential side effects.  He is agreeable to start this.  Will start Zepbound  2.5 mg daily.  He will follow-up with us  in a few weeks via MyChart we can adjust the dose as tolerated.     Subjective:  HPI:  See assessment / plan for status of chronic conditions.   Discussed the use of AI scribe software for clinical note transcription with the patient, who gave verbal consent to proceed.  History of Present Illness Gilmer Kaminsky III Cozetta is a 33 year old male with insomnia and agoraphobia who presents with sleep issues and housing assistance.  He experiences significant sleep disturbances, taking a couple of hours to fall asleep even after taking trazodone  and ramelteon , which provide minimal relief. He often feels tired throughout the day due to late sleep onset around 11 or 12 o'clock. He previously tried Ambien  but experienced sleepwalking, which he wishes to avoid. In a hospital setting, he tolerated Ambien  without issues. He is concerned about the cost of Belsomra , which was $448 even with insurance coverage last  year.  He is interested in trying a different medication in the similar class to Ambien  to help with his sleep.  He has been diagnosed with agoraphobia and is currently living in a homeless shelter for about seven months. He is currently living in a homeless shelter for about seven months and is seeking a letter to expedite his housing paperwork. He mentions having a dog, which is not well-received by everyone in the shelter.  His mood is stable regarding anxiety,  PTSD, and depression, although not improving, which he attributes to environmental factors. He is currently taking diazepam  for anxiety but finds it ineffective, stating it 'didn't really do anything' and made him feel 'a little more nervous.' He last took diazepam  three days ago.  He is interested in switching back to clonazepam  which worked well in the past.   He has concerns about his weight, noting that he has gained back weight to 320 pounds after previously losing it. He is interested in exploring options to address this issue.         Objective:  Physical Exam: BP 124/77   Pulse 69   Temp (!) 97.3 F (36.3 C) (Temporal)   Ht 6' (1.829 m)   Wt (!) 319 lb (144.7 kg)   SpO2 98%   BMI 43.26 kg/m   Wt Readings from Last 3 Encounters:  06/12/24 (!) 319 lb (144.7 kg)  04/17/24 (!) 303 lb (137.4 kg)  12/27/23 270 lb (122.5 kg)    Gen: No acute distress, resting comfortably Neuro: Grossly normal, moves all extremities Psych: Normal affect and thought content      Hau Sanor M. Kennyth, MD 06/12/2024 1:44 PM

## 2024-06-12 NOTE — Assessment & Plan Note (Signed)
 BMI 43 today with comorbidities.  He would be a good candidate for GLP agonist.  We did discuss potential side effects.  He is agreeable to start this.  Will start Zepbound  2.5 mg daily.  He will follow-up with us  in a few weeks via MyChart we can adjust the dose as tolerated.

## 2024-06-12 NOTE — Patient Instructions (Signed)
 It was very nice to see you today!  VISIT SUMMARY: During your visit, we discussed your ongoing sleep issues, weight concerns, and anxiety management. We also addressed your housing situation and provided a letter to help expedite your housing paperwork.  YOUR PLAN: CHRONIC INSOMNIA: You have ongoing sleep issues despite taking trazodone  and ramelteon . Ambien  is not an option due to past sleepwalking. -We will try Lunesta  (eszopiclone ) at a low dose to help with your sleep. Please do not take it with any benzodiazepines.  MORBID OBESITY: You have gained weight back to 320 pounds and are interested in exploring weight loss options. -We will start you on Zepbound , a GLP-1 agonist, at a low dose 2.5 mg weekly.  Take this for 4 weeks and then let us  know if you would like to increase the dose.  GENERALIZED ANXIETY DISORDER WITH DEPRESSION AND AGORAPHOBIA: Your anxiety and depression have not improved with diazepam , and you are living in a homeless shelter. -We will switch your medication to clonazepam . Please do not take it with Lunesta . -A letter has been provided to help expedite your housing paperwork.  Return in about 3 months (around 09/12/2024) for Follow Up.   Take care, Dr Kennyth  PLEASE NOTE:  If you had any lab tests, please let us  know if you have not heard back within a few days. You may see your results on mychart before we have a chance to review them but we will give you a call once they are reviewed by us .   If we ordered any referrals today, please let us  know if you have not heard from their office within the next week.   If you had any urgent prescriptions sent in today, please check with the pharmacy within an hour of our visit to make sure the prescription was transmitted appropriately.   Please try these tips to maintain a healthy lifestyle:  Eat at least 3 REAL meals and 1-2 snacks per day.  Aim for no more than 5 hours between eating.  If you eat breakfast, please  do so within one hour of getting up.   Each meal should contain half fruits/vegetables, one quarter protein, and one quarter carbs (no bigger than a computer mouse)  Cut down on sweet beverages. This includes juice, soda, and sweet tea.   Drink at least 1 glass of water with each meal and aim for at least 8 glasses per day  Exercise at least 150 minutes every week.

## 2024-06-15 ENCOUNTER — Telehealth: Payer: Self-pay

## 2024-06-15 ENCOUNTER — Other Ambulatory Visit (HOSPITAL_COMMUNITY): Payer: Self-pay

## 2024-06-15 ENCOUNTER — Telehealth: Payer: Self-pay | Admitting: *Deleted

## 2024-06-15 NOTE — Telephone Encounter (Signed)
 Copied from CRM [id number]. Topic: General - Other >> Jun 12, 2024  4:01 PM Rosina BIRCH wrote: Reason for RMF:mnapwd from [facility] called stating they received a prescription for  lunesta  for the patient and they need an associated diagnosis code for it [phone]

## 2024-06-15 NOTE — Telephone Encounter (Signed)
 Pharmacy Patient Advocate Encounter   Received notification from Onbase that prior authorization for Zepbound  2.5MG /0.5ML auto-injectors is required/requested.   Insurance verification completed.   The patient is insured through [facility] .   Per test claim:  Wegovy  is preferred by the insurance.  If suggested medication is appropriate, Please send in a new RX and discontinue this one. If not, please advise as to why it's not appropriate so that we may request a Prior Authorization. Please note, some preferred medications may still require a PA.  If the suggested medications have not been trialed and there are no contraindications to their use, the PA will not be submitted, as it will not be approved.

## 2024-06-15 NOTE — Telephone Encounter (Signed)
 Left a detailed message on the pharmacy voicemail at [facility] stating the diagnosis for Lunesta  is adjustment disorder-code F51.02.

## 2024-06-16 MED ORDER — SEMAGLUTIDE-WEIGHT MANAGEMENT 0.25 MG/0.5ML ~~LOC~~ SOAJ: 0.2500 mg | SUBCUTANEOUS | 1 refills | Status: DC

## 2024-06-16 NOTE — Telephone Encounter (Signed)
 Rx sent to pharmacy of [facility] .  Left message for patient to return call.

## 2024-06-16 NOTE — Telephone Encounter (Signed)
 Ok to send in Wegovy  0.25 mg weekly instead

## 2024-06-16 NOTE — Telephone Encounter (Signed)
 Patient return call and has been made aware of chart notation.

## 2024-06-16 NOTE — Telephone Encounter (Signed)
 Noted

## 2024-06-17 ENCOUNTER — Other Ambulatory Visit: Payer: Self-pay | Admitting: Family Medicine

## 2024-06-17 ENCOUNTER — Telehealth: Payer: Self-pay

## 2024-06-17 ENCOUNTER — Other Ambulatory Visit (HOSPITAL_COMMUNITY): Payer: Self-pay

## 2024-06-17 NOTE — Telephone Encounter (Signed)
 Pharmacy Patient Advocate Encounter   Received notification from Onbase that prior authorization for Wegovy  0.25MG /0.5ML auto-injectors is required/requested.   Insurance verification completed.   The patient is insured through [facility] MEDICA[employer] .   Per test claim: PA required; PA submitted to above mentioned insurance via Latent Key/confirmation #/EOC [id number] Status is pending

## 2024-06-18 ENCOUNTER — Encounter: Payer: Self-pay | Admitting: Family Medicine

## 2024-06-18 NOTE — Telephone Encounter (Signed)
**Note De-identified  [place] Obfuscation** Please advise 

## 2024-06-18 NOTE — Telephone Encounter (Signed)
 Pharmacy Patient Advocate Encounter  Received notification from [facility] MEDICAID that Prior Authorization for Wegovy  0.25MG /0.5ML auto-injectors has been APPROVED from 06/17/24 to 12/18/24   PA #/Case ID/Reference #: [id number]

## 2024-06-18 NOTE — Telephone Encounter (Signed)
 Ok to place referral to urology for enuresis.

## 2024-06-18 NOTE — Telephone Encounter (Signed)
 Patient notified Wegovy  0.25MG /0.5ML auto-injectors has been APPROVED from 06/17/24 to 12/18/24

## 2024-06-19 ENCOUNTER — Other Ambulatory Visit: Payer: Self-pay | Admitting: *Deleted

## 2024-06-19 DIAGNOSIS — R32 Unspecified urinary incontinence: Secondary | ICD-10-CM

## 2024-06-19 NOTE — Telephone Encounter (Signed)
 Referral placed.

## 2024-06-29 ENCOUNTER — Telehealth: Payer: Self-pay | Admitting: *Deleted

## 2024-06-29 ENCOUNTER — Other Ambulatory Visit: Payer: Self-pay | Admitting: Family Medicine

## 2024-06-29 NOTE — Telephone Encounter (Unsigned)
 Copied from CRM [id number]. Topic: Clinical - Medication Refill >> Jun 29, 2024 11:20 AM Turkey A wrote: Medication: eszopiclone  (LUNESTA ) 1 MG TABS tablet  Has the patient contacted their pharmacy? No (Agent: If no, request that the patient contact the pharmacy for the refill. If patient does not wish to contact the pharmacy document the reason why and proceed with request.) (Agent: If yes, when and what did the pharmacy advise?) Only gave 15 pills not 30 day supply  This is the patient's preferred pharmacy:  [facility] [address], [place] - [address] Omar Krause [place] [address] Phone: [phone] Fax: [phone]  Is this the correct pharmacy for this prescription? Yes If no, delete pharmacy and type the correct one.   Has the prescription been filled recently? No  Is the patient out of the medication? Yes  Has the patient been seen for an appointment in the last year OR does the patient have an upcoming appointment? Yes  Can we respond through MyChart? Yes  Agent: Please be advised that Rx refills may take up to 3 business days. We ask that you follow-up with your pharmacy.

## 2024-06-29 NOTE — Telephone Encounter (Signed)
 Copied from CRM [id number]. Topic: Clinical - Medication Question >> Jun 29, 2024 11:22 AM Turkey A wrote: Reason for CRM: Patient would like for eszopiclone  (LUNESTA ) 1 MG TABS tablet to be increased please  Duplicated Allianna Beaubien,RMA

## 2024-06-30 ENCOUNTER — Other Ambulatory Visit (HOSPITAL_COMMUNITY): Payer: Self-pay

## 2024-06-30 MED ORDER — ESZOPICLONE 1 MG PO TABS: 1.0000 mg | ORAL_TABLET | Freq: Every evening | ORAL | 5 refills | Status: DC | PRN

## 2024-06-30 NOTE — Telephone Encounter (Signed)
**Note De-identified  [place] Obfuscation** Please advise 

## 2024-07-01 ENCOUNTER — Other Ambulatory Visit: Payer: Self-pay | Admitting: Family Medicine

## 2024-07-01 MED ORDER — ESZOPICLONE 2 MG PO TABS: 2.0000 mg | ORAL_TABLET | Freq: Every evening | ORAL | 5 refills | Status: DC | PRN

## 2024-07-01 NOTE — Telephone Encounter (Signed)
 Prescription sent in for 2 mg tablet. He should follow up with us  in a few weeks.

## 2024-07-02 ENCOUNTER — Other Ambulatory Visit: Payer: Self-pay | Admitting: *Deleted

## 2024-07-02 ENCOUNTER — Encounter: Payer: Self-pay | Admitting: Family Medicine

## 2024-07-02 MED ORDER — WEGOVY 0.5 MG/0.5ML ~~LOC~~ SOAJ: 0.5000 mg | SUBCUTANEOUS | 0 refills | Status: DC

## 2024-07-03 ENCOUNTER — Other Ambulatory Visit: Payer: Self-pay | Admitting: Family Medicine

## 2024-07-03 ENCOUNTER — Other Ambulatory Visit: Payer: Self-pay | Admitting: Licensed Clinical Social Worker

## 2024-07-03 NOTE — Patient Instructions (Signed)
 Visit Information  [name] was given information about Medicaid Managed Care team care coordination services as a part of their [employer] Medicaid benefit.   If you would like to schedule transportation through your [facility] plan, please call the following number at least 2 days in advance of your appointment: [phone]  If you are experiencing a behavioral health crisis, call the [employer] [place]  Behavioral Health Crisis Line at [phone]). The line is available 24 hours a day, seven days a week.   Mr. Coats - following are the goals we discussed in your visit today:    Goals Addressed             This Visit's Progress    VBCI Social Work Care Plan   On track    Problems:   Disease Management support and education needs related to Depression: anxiety and PTSD  CSW Clinical Goal(s):   Over the next 90 days the Patient will attend all scheduled medical appointments as evidenced by patient report and care team review of appointment completion in electronic MEDICAL RECORD NUMBER  demonstrate a reduction in symptoms related to Depression: anxiety PTSD .  Interventions:  Mental Health:  Evaluation of current treatment plan related to Depression: anxiety and PTSD Active listening / Reflection utilized Financial risk analyst / information provided Emotional Support Provided Participation in counseling encourage : Pt has a scheduled appt with [facility] next week Problem Solving /Task Center strategies reviewed Reviewed mental health medications and discussed importance of compliance: Pt reports compliance Patient continues to attend job interviews with the assistance of vocational rehab  Patient Goals/Self-Care Activities:  Connect with provider for ongoing mental health treatment.   Continue taking your medication as prescribed.   Increase coping skills, healthy habits, and stress  reduction  Plan:   Telephone follow up appointment with care management team member scheduled for:  4 weeks        Please see education materials related to topics discussed provided by MyChart link.  Patient verbalizes understanding of instructions and care plan provided today and agrees to view in MyChart. Active MyChart status and patient understanding of how to access instructions and care plan via MyChart confirmed with patient.     Licensed Clinical Social Worker will call you on 10/09 at 3 PM  [name], LCSW [facility], [facility] Clinical Social Worker Direct Dial: [phone]  Fax: [phone] Website: [email or web address] 2:48 PM   Following is a copy of your plan of care:  There are no care plans that you recently modified to display for this patient.

## 2024-07-15 ENCOUNTER — Telehealth: Payer: Self-pay | Admitting: *Deleted

## 2024-07-15 NOTE — Telephone Encounter (Signed)
 Copied from CRM #[id number]. Topic: General - Other >> Jul 13, 2024  8:38 AM Pinkey ORN wrote: Reason for CRM: Verification Of Disabilities >> Jul 13, 2024  8:41 AM Pinkey ORN wrote: Patient states he's currently in a homeless shelter and Kenny[place], [place] HERO, MD has written him a letter so that they could expedite his housing situation. Patient now states in order to complete this [name] HERO, MD has to complete a verification of disabilities form that will be faxed over by the homeless shelter, patient just wanted to give [name] HERO, MD a headups on what it was and what's needed.    Form placed to be reviewed in PCP office  Cobi Delph,RMA     Form faxed to [phone] Placed to be scan in patient chart [facility]

## 2024-07-17 ENCOUNTER — Telehealth: Payer: Self-pay | Admitting: *Deleted

## 2024-07-17 ENCOUNTER — Other Ambulatory Visit: Payer: Self-pay | Admitting: Family Medicine

## 2024-07-17 NOTE — Telephone Encounter (Signed)
 Copied from CRM #[id number]. Topic: Clinical - Medical Advice >> Jul 17, 2024  8:49 AM Anairis L wrote: Reason for CRM: Patient is calling in because insurance infromed him, they will not cover semaglutide -weight management (WEGOVY ) 0.5 MG/0.5ML SOAJ SQ injection.  Please advise.  Left message to return call to our office at their convenience.  Cathan Gearin,RMA

## 2024-07-17 NOTE — Telephone Encounter (Signed)
 Duplicate

## 2024-07-17 NOTE — Telephone Encounter (Signed)
 Copied from CRM #[id number]. Topic: Clinical - Medication Refill >> Jul 17, 2024  2:26 PM Alfonso ORN wrote: Medication: semaglutide -weight management (WEGOVY ) 0.5 MG/0.5ML SOAJ SQ injection.  Has the patient contacted their pharmacy? Yes (Agent: If no, request that the patient contact the pharmacy for the refill. If patient does not wish to contact the pharmacy document the reason why and proceed with request.) (Agent: If yes, when and what did the pharmacy advise?)  This is the patient's preferred pharmacy:  [facility] [address]  [address] [phone]  Is this the correct pharmacy for this prescription? Yes  Has the prescription been filled recently? Yes  Is the patient out of the medication? No  Has the patient been seen for an appointment in the last year OR does the patient have an upcoming appointment? Yes  Can we respond through MyChart? Yes   Pt changed pharmacy to see if rx is covered

## 2024-07-17 NOTE — Telephone Encounter (Addendum)
 Copied from CRM #[id number]. Topic: Clinical - Medical Advice >> Jul 17, 2024  8:49 AM Omar Krause wrote: Reason for CRM: Patient is calling in because insurance infromed him, they will not cover semaglutide -weight management (WEGOVY ) 0.5 MG/0.5ML SOAJ SQ injection.  Please advise.   Left message to return call to our office at their convenience.  Omar Krause,RMA   Spoke with patient, advise patient to contact insurance for medication alternatives  Verbalized understanding  Omar Krause,RMA

## 2024-07-20 ENCOUNTER — Ambulatory Visit: Admitting: Family Medicine

## 2024-07-20 MED ORDER — WEGOVY 0.5 MG/0.5ML ~~LOC~~ SOAJ: 0.5000 mg | SUBCUTANEOUS | 0 refills | Status: DC

## 2024-07-23 ENCOUNTER — Other Ambulatory Visit: Payer: Self-pay | Admitting: Family Medicine

## 2024-07-27 ENCOUNTER — Telehealth: Payer: Self-pay | Admitting: *Deleted

## 2024-07-27 NOTE — Progress Notes (Unsigned)
 Complex Care Management Care Guide Note  07/27/2024 Name: Omar Krause MRN: [id number] DOB: November 16, 1990  Omar Krause is a 33 y.o. year old male who is a primary care patient of [name] HERO, MD and is actively engaged with the care management team. I reached out to Omar Krause by phone today to assist with re-scheduling  with the Licensed Clinical Social Worker.  Follow up plan: Unsuccessful telephone outreach attempt made. A HIPAA compliant phone message was left for the patient providing contact information and requesting a return call.  Harlene Satterfield  [facility]  Value-Based Care Institute, [facility] Guide  Direct Dial: [phone]  Fax [phone]

## 2024-07-29 NOTE — Progress Notes (Unsigned)
 Complex Care Management Care Guide Note  07/29/2024 Name: Omar Krause MRN: [id number] DOB: 1991-03-17  Omar Krause is a 33 y.o. year old male who is a primary care patient of [name] HERO, MD and is actively engaged with the care management team. I reached out to Omar Krause by phone today to assist with re-scheduling  with the Licensed Clinical Social Worker.  Follow up plan: Unsuccessful telephone outreach attempt made. A HIPAA compliant phone message was left for the patient providing contact information and requesting a return call.  Harlene Satterfield  [facility]  Value-Based Care Institute, [facility] Guide  Direct Dial: [phone]  Fax [phone]

## 2024-07-30 ENCOUNTER — Telehealth: Admitting: Licensed Clinical Social Worker

## 2024-07-30 NOTE — Progress Notes (Signed)
 Complex Care Management Care Guide Note  07/30/2024 Name: Ramir Malerba III MRN: [id number] DOB: 08-Feb-1991  Danetta Shank III is a 33 y.o. year old male who is a primary care patient of [name] HERO, MD and is actively engaged with the care management team. I reached out to Danetta Shank III by phone today to assist with re-scheduling  with the Licensed Clinical Social Worker.  Follow up plan: Telephone appointment with complex care management team member scheduled for:  08/24/24  Harlene Satterfield  [facility]  [facility], [facility] Guide  Direct Dial: [phone]  Fax [phone]

## 2024-07-30 NOTE — Progress Notes (Signed)
 Complex Care Management Care Guide Note  07/30/2024 Name: Omar Krause MRN: [id number] DOB: 06-Sep-1991  [name] is a 33 y.o. year old male who is a primary care patient of [name] HERO, MD and is actively engaged with the care management team. I reached out to [name] by phone today to assist with re-scheduling  with the Licensed Clinical Social Worker.  Follow up plan: Unsuccessful telephone outreach attempt made. A HIPAA compliant phone message was left for the patient providing contact information and requesting a return call. No further outreach attempts will be made at this time. We have been unable to contact the patient to reschedule for complex care management services.   Harlene Satterfield  [facility]  Value-Based Care Institute, [facility] Guide  Direct Dial: [phone]  Fax [phone]

## 2024-08-20 ENCOUNTER — Other Ambulatory Visit: Payer: Self-pay | Admitting: Family Medicine

## 2024-08-21 ENCOUNTER — Telehealth: Payer: Self-pay | Admitting: *Deleted

## 2024-08-21 ENCOUNTER — Other Ambulatory Visit: Payer: Self-pay | Admitting: Family Medicine

## 2024-08-21 MED ORDER — ESZOPICLONE 2 MG PO TABS: 2.0000 mg | ORAL_TABLET | Freq: Every evening | ORAL | 5 refills | Status: DC | PRN

## 2024-08-21 NOTE — Telephone Encounter (Signed)
 Copied from CRM #[id number]. Topic: Clinical - Prescription Issue >> Aug 21, 2024 11:22 AM Rea BROCKS wrote: Reason for CRM: eszopiclone  (LUNESTA ) 2 MG TABS tablet.   [facility] [address], [place] - [address] [place] [address] Phone: [phone] Fax: [phone] Hours: Not open 24 hours   [facility] stated that Medicaid will only pay for 15 instead of 30 days. And, they need the script to be updated.    [id number] (M)

## 2024-08-21 NOTE — Telephone Encounter (Signed)
 Copied from CRM [id number]. Topic: General - Other >> Aug 21, 2024 12:52 PM Nessti S wrote: Reason for CRM: pt went pharmacy and got the issue straighten out.   Noted  Kruz Chiu,RMA

## 2024-08-24 ENCOUNTER — Other Ambulatory Visit: Payer: Self-pay | Admitting: Licensed Clinical Social Worker

## 2024-08-24 ENCOUNTER — Other Ambulatory Visit: Payer: Self-pay | Admitting: Family Medicine

## 2024-08-24 NOTE — Patient Instructions (Signed)
 Visit Information  Mr. Roesler was given information about Medicaid Managed Care team care coordination services as a part of their [employer] Medicaid benefit.   If you would like to schedule transportation through your [employer] [facility] plan, please call the following number at least 2 days in advance of your appointment: [phone]  If you are experiencing a behavioral health crisis, call the [employer] [place]  Behavioral Health Crisis Line at [phone]). The line is available 24 hours a day, seven days a week.    Please see education materials related to topics discussed provided by MyChart link.  Care plan and visit instructions communicated with the patient verbally today. Patient agrees to receive a copy in MyChart. Active MyChart status and patient understanding of how to access instructions and care plan via MyChart confirmed with patient.     No further follow up required: Pt agreed to f/up with PCP if any additional needs arise  [name], LCSW [facility]  [facility], [facility] Clinical Social Worker Direct Dial: [phone]  Fax: [phone] Website: [email or web address] 3:21 PM   Following is a copy of your plan of care:  There are no care plans that you recently modified to display for this patient.

## 2024-08-24 NOTE — Patient Outreach (Signed)
 Complex Care Management   Visit Note  08/24/2024  Name:  Omar Krause MRN: [id number] DOB: 18-Jun-1991  Situation: Referral received for Complex Care Management related to Mental/Behavioral Health diagnosis MDD/GAD/PTSD I obtained verbal consent from Patient.  Visit completed with Patient  on the phone  Background:   Past Medical History:  Diagnosis Date   Anxiety    Asthma    No inhaler at home, has not had issues since he was child   Depression    Hypertension    Morbid obesity (HCC)    Nausea    Palpitations    Seizures (HCC)     Assessment: Patient Reported Symptoms:  Cognitive Cognitive Status: No symptoms reported, Alert and oriented to person, place, and time, Normal speech and language skills Cognitive/Intellectual Conditions Management [RPT]: None reported or documented in medical history or problem list   Health Maintenance Behaviors: Annual physical exam  Neurological Neurological Review of Symptoms: No symptoms reported    HEENT HEENT Symptoms Reported: No symptoms reported      Cardiovascular Cardiovascular Symptoms Reported: No symptoms reported    Respiratory Respiratory Symptoms Reported: No symptoms reported    Endocrine Endocrine Symptoms Reported: No symptoms reported    Gastrointestinal Gastrointestinal Symptoms Reported: No symptoms reported      Genitourinary Genitourinary Symptoms Reported: No symptoms reported    Integumentary Integumentary Symptoms Reported: No symptoms reported    Musculoskeletal Musculoskelatal Symptoms Reviewed: No symptoms reported        Psychosocial Psychosocial Symptoms Reported: No symptoms reported Behavioral Management Strategies: Coping strategies, Medication therapy, Counseling Major Change/Loss/Stressor/Fears (CP): Denies Techniques to Cope with Loss/Stress/Change: Counseling, Medication      08/24/2024    PHQ2-9 Depression Screening   Little interest or pleasure in doing things    Feeling down,  depressed, or hopeless    PHQ-2 - Total Score    Trouble falling or staying asleep, or sleeping too much    Feeling tired or having little energy    Poor appetite or overeating     Feeling bad about yourself - or that you are a failure or have let yourself or your family down    Trouble concentrating on things, such as reading the newspaper or watching television    Moving or speaking so slowly that other people could have noticed.  Or the opposite - being so fidgety or restless that you have been moving around a lot more than usual    Thoughts that you would be better off dead, or hurting yourself in some way    PHQ2-9 Total Score    If you checked off any problems, how difficult have these problems made it for you to do your work, take care of things at home, or get along with other people    Depression Interventions/Treatment      There were no vitals filed for this visit.  Medications Reviewed Today     Reviewed by Ezzard Rolin BIRCH, LCSW (Social Worker) on 08/24/24 at 1512  Med List Status: <None>   Medication Order Taking? Sig Documenting Provider Last Dose Status Informant  clonazePAM  (KLONOPIN ) 1 MG tablet [id number]  TAKE 1 TABLET BY MOUTH TWICE DAILY AS NEEDED FOR ANXIETY (DO  NOT  TAKE  WITH  LUNESTA ) [name] HERO, MD  Active   eszopiclone  (LUNESTA ) 2 MG TABS tablet [id number]  Take 1 tablet (2 mg total) by mouth at bedtime as needed for sleep. Take immediately before bedtime. [name] HERO, MD  Active  gabapentin  (NEURONTIN ) 300 MG capsule [id number]  Take 1 capsule (300 mg total) by mouth 3 (three) times daily. [name] HERO, MD  Active   losartan  (COZAAR ) 100 MG tablet [id number]  Take 1 tablet (100 mg total) by mouth daily. [name] HERO, MD  Active   Multiple Vitamin (MULTIVITAMIN WITH MINERALS) TABS tablet [id number]  Take 1 tablet by mouth daily. [provider]  Active Self  nicotine  (NICODERM CQ  - DOSED IN MG/24 HOURS) 14 mg/24hr patch [id number]  Place 1  patch (14 mg total) onto the skin daily. [name] HERO, MD  Active   OLANZapine  (ZYPREXA ) 10 MG tablet [id number]  Take 10 mg by mouth at bedtime. [provider]  Active   pantoprazole  (PROTONIX ) 40 MG tablet [id number]  Take 1 tablet by mouth once daily [name] HERO, MD  Active   prazosin  (MINIPRESS ) 2 MG capsule [id number]  Take 1 capsule by mouth at bedtime [name] HERO, MD  Active   propranolol  ER (INDERAL  LA) 60 MG 24 hr capsule [id number]  Take 1 capsule by mouth once daily Parker, Caleb M, MD  Active   semaglutide -weight management (WEGOVY ) 0.5 MG/0.5ML SOAJ SQ injection [id number]  Inject 0.5 mg into the skin once a week. [name] HERO, MD  Active   venlafaxine  XR (EFFEXOR -XR) 75 MG 24 hr capsule [id number]  Take 75 mg by mouth daily. [provider]  Active   Vitamin D , Ergocalciferol , (DRISDOL ) 1.25 MG (50000 UNIT) CAPS capsule [id number]  Take 1 capsule (50,000 Units total) by mouth every 7 (seven) days. [name] HERO, MD  Active             Recommendation:   Continue Current Plan of Care  Follow Up Plan:   Patient has met all care management goals. Care Management case will be closed. Patient has been provided contact information should new needs arise.   Rolin Ezzard HUGHS [facility]  [facility], [facility] Clinical Social Worker Direct Dial: [phone]  Fax: [phone] Website: [email or web address] 3:20 PM

## 2024-08-25 ENCOUNTER — Ambulatory Visit: Admitting: Family Medicine

## 2024-08-28 ENCOUNTER — Ambulatory Visit: Admitting: Family Medicine

## 2024-08-30 ENCOUNTER — Other Ambulatory Visit: Payer: Self-pay | Admitting: Family Medicine

## 2024-09-02 ENCOUNTER — Other Ambulatory Visit (HOSPITAL_COMMUNITY): Payer: Self-pay

## 2024-09-02 ENCOUNTER — Telehealth: Payer: Self-pay

## 2024-09-02 NOTE — Telephone Encounter (Signed)
 Pharmacy Patient Advocate Encounter   Received notification from Onbase that prior authorization for Belsomra  10MG  tablets is required/requested.   Insurance verification completed.   The patient is insured through [facility] MEDICAID.   Per test claim: The current 30 day co-pay is, $4.00.  No PA needed at this time. This test claim was processed through [facility]- copay amounts may vary at other pharmacies due to pharmacy/plan contracts, or as the patient moves through the different stages of their insurance plan.    -Plan has limitations; insurance covers 15 tabs per 30 days.

## 2024-09-03 ENCOUNTER — Ambulatory Visit: Admitting: Family Medicine

## 2024-09-10 ENCOUNTER — Ambulatory Visit: Admitting: Family Medicine

## 2024-09-14 ENCOUNTER — Ambulatory Visit: Admitting: Family Medicine

## 2024-09-14 ENCOUNTER — Encounter: Payer: Self-pay | Admitting: Family Medicine

## 2024-09-20 ENCOUNTER — Other Ambulatory Visit: Payer: Self-pay | Admitting: Family Medicine

## 2024-09-21 ENCOUNTER — Encounter: Payer: Self-pay | Admitting: Family Medicine

## 2024-09-21 ENCOUNTER — Ambulatory Visit: Admitting: Family Medicine

## 2024-09-21 VITALS — BP 164/100 | HR 77 | Temp 97.3°F | Ht 72.0 in | Wt 301.4 lb

## 2024-09-21 DIAGNOSIS — R059 Cough, unspecified: Secondary | ICD-10-CM | POA: Diagnosis not present

## 2024-09-21 DIAGNOSIS — J4520 Mild intermittent asthma, uncomplicated: Secondary | ICD-10-CM | POA: Diagnosis not present

## 2024-09-21 DIAGNOSIS — F411 Generalized anxiety disorder: Secondary | ICD-10-CM | POA: Diagnosis not present

## 2024-09-21 DIAGNOSIS — I10 Essential (primary) hypertension: Secondary | ICD-10-CM

## 2024-09-21 DIAGNOSIS — L0390 Cellulitis, unspecified: Secondary | ICD-10-CM

## 2024-09-21 MED ORDER — ALBUTEROL SULFATE HFA 108 (90 BASE) MCG/ACT IN AERS: 2.0000 | INHALATION_SPRAY | Freq: Four times a day (QID) | RESPIRATORY_TRACT | 0 refills | Status: DC | PRN

## 2024-09-21 MED ORDER — BACITRACIN 500 UNIT/GM EX OINT: 1.0000 | TOPICAL_OINTMENT | Freq: Two times a day (BID) | CUTANEOUS | 0 refills | Status: AC

## 2024-09-21 MED ORDER — ALPRAZOLAM 1 MG PO TABS: 1.0000 mg | ORAL_TABLET | Freq: Every evening | ORAL | 5 refills | Status: DC | PRN

## 2024-09-21 MED ORDER — AMOXICILLIN-POT CLAVULANATE 875-125 MG PO TABS: 1.0000 | ORAL_TABLET | Freq: Two times a day (BID) | ORAL | 0 refills | Status: DC

## 2024-09-21 NOTE — Assessment & Plan Note (Addendum)
 Elevated today though he is typically well controlled.  Patient's blood pressure initially 148/100 though increased to 164/100 on recheck.  He was well-controlled at his most recent office visit here.  We will continue the propranolol  60 mg daily and losartan  100 mg daily.  He will continue to monitor at home and follow-up with us  if persistently elevated.

## 2024-09-21 NOTE — Assessment & Plan Note (Signed)
 We started Zepbound  at his most recent office visit however insurance would not pay for this.  He was on Wegovy  for about a month before losing insurance coverage for this medication.  Has been off all medications for the last few weeks though has been working on modifying his diet and activity levels.  He is down about 18 pounds since his most recent visit here.  We did discuss referral for sleep study to see if we can get him approved for Zepbound  however he would like to hold off on this for now.  He will let us  know if he changes his mind.

## 2024-09-21 NOTE — Progress Notes (Signed)
 Omar Krause is a 33 y.o. male who presents today for an office visit.  Assessment/Plan:  New/Acute Problems: Cough  Patient with scattered wheezes on rhonchi exam consistent with acute bronchitis versus asthma exacerbation.  Does have a smoking history as well.  Overall reassuring exam without any signs of respiratory distress.  He has had previous bad reactions to prednisone  including worsening anxiety-will avoid this today.  Will send new prescription for albuterol  inhaler and also start Augmentin which should hopefully cover his cellulitis and developing potential left otitis media as well.  We encouraged hydration.  He will let us  know if not improving over the next several days  Cellulitis Patient with open wound on right arm that looks to be developing cellulitis.  Recommended starting topical bacitracin  and that we will also be starting Augmentin as above which should hopefully cover for this as well   Chronic Problems Addressed Today: Generalized anxiety disorder Overall stable though symptoms are still not fully controlled.  He is following with psychiatry and is currently on Zyprexa  10 mg daily, gabapentin  300 mg 3 times daily and Effexor  75 mg daily.  He has been on clonazepam  though feels like it has lost its effectiveness.  He would like to try switching back to alprazolam  as he has done very with this in the past.  Database was reviewed without red flags.  He is aware not to take this with either his Lunesta  or clonazepam .  Also advised him to discuss this with his psychiatrist.  He will follow-up with us  in a few weeks via MyChart.  Asthma, mild intermittent Patient with exam consistent with mild asthma flare.  We are treating this as above.  Morbid obesity (HCC) We started Zepbound  at his most recent office visit however insurance would not pay for this.  He was on Wegovy  for about a month before losing insurance coverage for this medication.  Has been off all medications  for the last few weeks though has been working on modifying his diet and activity levels.  He is down about 18 pounds since his most recent visit here.  We did discuss referral for sleep study to see if we can get him approved for Zepbound  however he would like to hold off on this for now.  He will let us  know if he changes his mind.  Primary hypertension Elevated today though he is typically well controlled.  Patient's blood pressure initially 148/100 though increased to       Subjective:  HPI:  See assessment / plan for status of chronic conditions.    Discussed the use of AI scribe software for clinical note transcription with the patient, who gave verbal consent to proceed.  History of Present Illness Omar Krause Omar Krause is a 33 year old male who presents with a cough and ear pain.  He has been experiencing a cough and left ear pain for the past few days. The ear pain is localized to the left ear. He reports that his current cough feels similar to previous episodes of bronchitis that he has experienced in the past.  He has a history of using Wegovy  for weight loss, which was initially covered by his insurance until coverage guidelines changed. He has lost about eighteen pounds since his last visit. He is not currently taking any medication for weight loss but mentions eating less due to limited access to food.   He has never been formally diagnosed with sleep apnea, though it was suspected  by doctors about five years ago. He reports not feeling refreshed upon waking, which he recognizes as a potential sign of sleep apnea. He is currently homeless, which complicates his ability to pursue further diagnostic testing. For sleep issues, he finds Lunesta  helpful but is limited to fifteen pills per month by Medicaid. He has tried clonazepam , which helps him fall asleep, but he struggles with staying asleep. He has previously used alprazolam , which he found effective for sleep, and is  considering switching back to it.  He reports a wound that appeared about a week ago, which he suspects might be a bite and notes it looks infected. He has not been able to treat it due to lack of access to medication.        Objective:  Physical Exam: BP (!) 164/100   Pulse 77   Temp (!) 97.3 F (36.3 C) (Temporal)   Ht 6' (1.829 m)   Wt (!) 301 lb 6.4 oz (136.7 kg)   SpO2 96%   BMI 40.88 kg/m   Wt Readings from Last 3 Encounters:  09/21/24 (!) 301 lb 6.4 oz (136.7 kg)  06/12/24 (!) 319 lb (144.7 kg)  04/17/24 (!) 303 lb (137.4 kg)    Gen: No acute distress, resting comfortably HEENT: Left TM with effusion and mild erythema.  Right TM clear. CV: Regular rate and rhythm with no murmurs appreciated Pulm: Normal work of breathing, clear to auscultation bilaterally with no crackles, wheezes, or rhonchi Skin: Approximately 1 cm open lesion on right lateral arm with surrounding erythema and warmth.  No purulent drainage present. Neuro: Grossly normal, moves all extremities Psych: Normal affect and thought content      Dyan Creelman M. Kennyth, MD 09/21/2024 2:39 PM

## 2024-09-21 NOTE — Patient Instructions (Addendum)
 It was very nice to see you today!  VISIT SUMMARY: During your visit, we addressed your cough, ear pain, skin infection, weight management, and sleep issues. We have prescribed medications to help with these conditions and discussed your current weight loss and sleep management strategies.  YOUR PLAN: ACUTE BRONCHITIS: You have a cough that suggests acute bronchitis. -Take Augmentin as prescribed to treat the bronchitis. -Use the inhaler as directed to help open your airways.  LOCALIZED SKIN INFECTION: You have a wound that looks infected. -Apply bacitracin  topical cream to the infected area as directed. -Take Augmentin as prescribed to help with the infection.  LEFT EAR PAIN: You have pain in your left ear that may be due to an infection. -Take Augmentin as prescribed to treat the potential ear infection.  MORBID OBESITY DUE TO EXCESS CALORIES: You have lost 18 pounds since your last visit, which is a good progress. -Continue with your current weight loss efforts.  INSOMNIA: You have difficulty sleeping and have found some medications helpful. -Take alprazolam  1-2 tablets at night as needed for sleep. -Report back on the effectiveness of alprazolam  in a few weeks.  Return if symptoms worsen or fail to improve.   Take care, Dr Kennyth  PLEASE NOTE:  If you had any lab tests, please let us  know if you have not heard back within a few days. You may see your results on mychart before we have a chance to review them but we will give you a call once they are reviewed by us .   If we ordered any referrals today, please let us  know if you have not heard from their office within the next week.   If you had any urgent prescriptions sent in today, please check with the pharmacy within an hour of our visit to make sure the prescription was transmitted appropriately.   Please try these tips to maintain a healthy lifestyle:  Eat at least 3 REAL meals and 1-2 snacks per day.  Aim for no more  than 5 hours between eating.  If you eat breakfast, please do so within one hour of getting up.   Each meal should contain half fruits/vegetables, one quarter protein, and one quarter carbs (no bigger than a computer mouse)  Cut down on sweet beverages. This includes juice, soda, and sweet tea.   Drink at least 1 glass of water with each meal and aim for at least 8 glasses per day  Exercise at least 150 minutes every week.

## 2024-09-21 NOTE — Assessment & Plan Note (Signed)
 Patient with exam consistent with mild asthma flare.  We are treating this as above.

## 2024-09-21 NOTE — Assessment & Plan Note (Signed)
 Overall stable though symptoms are still not fully controlled.  He is following with psychiatry and is currently on Zyprexa  10 mg daily, gabapentin  300 mg 3 times daily and Effexor  75 mg daily.  He has been on clonazepam  though feels like it has lost its effectiveness.  He would like to try switching back to alprazolam  as he has done very with this in the past.  Database was reviewed without red flags.  He is aware not to take this with either his Lunesta  or clonazepam .  Also advised him to discuss this with his psychiatrist.  He will follow-up with us  in a few weeks via MyChart.

## 2024-09-28 ENCOUNTER — Telehealth: Payer: Self-pay

## 2024-09-28 NOTE — Telephone Encounter (Signed)
 Copied from CRM [id number]. Topic: Clinical - Medical Advice >> Sep 28, 2024  2:03 PM Alexandria E wrote: Reason for CRM: Patient was told to keep PCP updated if his blood pressure would remain elevated. Patient's BP from today 12/8 was 151/100, he just wanted to make PCP aware.  Please see pt call and msg to update on BP readings today. Advise any further recommendations

## 2024-09-29 ENCOUNTER — Other Ambulatory Visit: Payer: Self-pay

## 2024-09-29 MED ORDER — AMLODIPINE BESYLATE 5 MG PO TABS: 5.0000 mg | ORAL_TABLET | Freq: Every day | ORAL | 0 refills | Status: DC

## 2024-09-29 NOTE — Telephone Encounter (Signed)
 Called pt and pt agreeable to adding medication; pt advised will pick up and start today. Pt also scheduled for BP follow up in office on 10/19/24. Pt will be monitoring BP twice daily and recording readings to discuss with PCP at next visit.

## 2024-09-29 NOTE — Telephone Encounter (Signed)
 That is a bit higher than we would like for it to be. It would be a good idea to start an additional blood pressure medication at this point if he is agreeable.  Please send in amlodipine  5 mg daily and have him follow-up with us  in a few weeks.

## 2024-09-30 ENCOUNTER — Other Ambulatory Visit: Payer: Self-pay | Admitting: Family Medicine

## 2024-09-30 NOTE — Telephone Encounter (Signed)
 Copied from CRM #[id number]. Topic: Clinical - Medication Refill >> Sep 30, 2024  3:25 PM Shereese L wrote: Medication: losartan  (COZAAR ) 100 MG tablet  Has the patient contacted their pharmacy? Yes (Agent: If no, request that the patient contact the pharmacy for the refill. If patient does not wish to contact the pharmacy document the reason why and proceed with request.) (Agent: If yes, when and what did the pharmacy advise?)  This is the patient's preferred pharmacy:  [facility] [address] [place] - [address] [place] [address] Phone: [phone] Fax: [phone]  Is this the correct pharmacy for this prescription? Yes If no, delete pharmacy and type the correct one.   Has the prescription been filled recently? Yes  Is the patient out of the medication? Yes  Has the patient been seen for an appointment in the last year OR does the patient have an upcoming appointment? Yes  Can we respond through MyChart? Yes  Agent: Please be advised that Rx refills may take up to 3 business days. We ask that you follow-up with your pharmacy.

## 2024-10-01 ENCOUNTER — Encounter: Payer: Self-pay | Admitting: Family Medicine

## 2024-10-01 ENCOUNTER — Other Ambulatory Visit: Payer: Self-pay | Admitting: Family Medicine

## 2024-10-01 MED ORDER — LOSARTAN POTASSIUM 100 MG PO TABS: 100.0000 mg | ORAL_TABLET | Freq: Every day | ORAL | 3 refills | Status: DC

## 2024-10-01 NOTE — Telephone Encounter (Signed)
 Spoke with patient, stated no issues at this time, he resolve the problem with pharmacy

## 2024-10-17 ENCOUNTER — Other Ambulatory Visit: Payer: Self-pay | Admitting: Family Medicine

## 2024-10-19 ENCOUNTER — Ambulatory Visit: Admitting: Family Medicine

## 2024-10-24 ENCOUNTER — Other Ambulatory Visit: Payer: Self-pay | Admitting: Family Medicine

## 2024-10-30 ENCOUNTER — Ambulatory Visit: Admitting: Family Medicine

## 2024-10-30 ENCOUNTER — Other Ambulatory Visit: Payer: Self-pay | Admitting: Family Medicine

## 2024-10-30 ENCOUNTER — Encounter: Payer: Self-pay | Admitting: Family Medicine

## 2024-10-30 NOTE — Telephone Encounter (Signed)
 Requesting 90 day supply of both due to cost

## 2024-10-30 NOTE — Telephone Encounter (Signed)
 Please advise

## 2024-10-30 NOTE — Telephone Encounter (Signed)
 Copied from CRM [id number]. Topic: Clinical - Medication Refill >> Oct 30, 2024  8:41 AM Carlyon D wrote: Medication:  pantoprazole  (PROTONIX ) 40 MG tablet - would like 90 days supply as its cheaper.   amLODipine  (NORVASC ) 5 MG tablet - pt would also like 90 day supply on this medication as well    Has the patient contacted their pharmacy? Yes (Agent: If no, request that the patient contact the pharmacy for the refill. If patient does not wish to contact the pharmacy document the reason why and proceed with request.) (Agent: If yes, when and what did the pharmacy advise?)  This is the patient's preferred pharmacy:  [facility] [address] [place] - [address] [place] [address] Phone: [phone] Fax: [phone]   Is this the correct pharmacy for this prescription? Yes If no, delete pharmacy and type the correct one.   Has the prescription been filled recently? No  Is the patient out of the medication? Yes out of amlodepine   Has the patient been seen for an appointment in the last year OR does the patient have an upcoming appointment? Yes  Can we respond through MyChart? No, prefers phone call   Agent: Please be advised that Rx refills may take up to 3 business days. We ask that you follow-up with your pharmacy.

## 2024-10-30 NOTE — Telephone Encounter (Signed)
 Please make sure he has a follow-up appointment scheduled.  I do not believe that Medicaid will pay for the Zepbound  however they have recently started back with Wegovy .  We can try sending in Zepbound  2.5 mg weekly though we will probably have to try Wegovy  0.25 mg weekly based on insurance preferences.

## 2024-11-02 NOTE — Telephone Encounter (Signed)
 Noted

## 2024-11-03 ENCOUNTER — Other Ambulatory Visit: Payer: Self-pay | Admitting: *Deleted

## 2024-11-03 ENCOUNTER — Telehealth: Payer: Self-pay | Admitting: *Deleted

## 2024-11-03 MED ORDER — PANTOPRAZOLE SODIUM 40 MG PO TBEC: 40.0000 mg | DELAYED_RELEASE_TABLET | Freq: Every day | ORAL | 0 refills | Status: DC

## 2024-11-03 MED ORDER — AMLODIPINE BESYLATE 5 MG PO TABS: 5.0000 mg | ORAL_TABLET | Freq: Every day | ORAL | 0 refills | Status: DC

## 2024-11-03 NOTE — Telephone Encounter (Unsigned)
 Copied from CRM [id number]. Topic: Clinical - Medication Refill >> Oct 30, 2024  8:41 AM Carlyon D wrote: Medication:  pantoprazole  (PROTONIX ) 40 MG tablet - would like 90 days supply as its cheaper.   amLODipine  (NORVASC ) 5 MG tablet - pt would also like 90 day supply on this medication as well    Has the patient contacted their pharmacy? Yes (Agent: If no, request that the patient contact the pharmacy for the refill. If patient does not wish to contact the pharmacy document the reason why and proceed with request.) (Agent: If yes, when and what did the pharmacy advise?)  This is the patient's preferred pharmacy:  [facility] [address] [place] - [address] [place] [address] Phone: [phone] Fax: [phone]   Is this the correct pharmacy for this prescription? Yes If no, delete pharmacy and type the correct one.   Has the prescription been filled recently? No  Is the patient out of the medication? Yes out of amlodepine   Has the patient been seen for an appointment in the last year OR does the patient have an upcoming appointment? Yes  Can we respond through MyChart? No, prefers phone call   Agent: Please be advised that Rx refills may take up to 3 business days. We ask that you follow-up with your pharmacy. >> Oct 30, 2024  3:49 PM Pinkey ORN wrote: Patient is calling, states he's aware that refills can take up to 3 business days before completed but is urgently requesting that his medications are called into the pharmacy before end of business day today as he stated he's completely out. Patient is also requesting that it be sent with the 90- day supply.

## 2024-11-03 NOTE — Telephone Encounter (Signed)
 Copied from CRM [id number]. Topic: Clinical - Medication Refill >> Oct 30, 2024  8:41 AM Carlyon D wrote: Medication:  pantoprazole  (PROTONIX ) 40 MG tablet - would like 90 days supply as its cheaper.   amLODipine  (NORVASC ) 5 MG tablet - pt would also like 90 day supply on this medication as well    Has the patient contacted their pharmacy? Yes (Agent: If no, request that the patient contact the pharmacy for the refill. If patient does not wish to contact the pharmacy document the reason why and proceed with request.) (Agent: If yes, when and what did the pharmacy advise?)  This is the patient's preferred pharmacy:  [facility] [address], [place] - [address] [place] [address] Phone: [phone] Fax: [phone]   Is this the correct pharmacy for this prescription? Yes If no, delete pharmacy and type the correct one.   Has the prescription been filled recently? No  Is the patient out of the medication? Yes out of amlodepine   Has the patient been seen for an appointment in the last year OR does the patient have an upcoming appointment? Yes  Can we respond through MyChart? No, prefers phone call   Agent: Please be advised that Rx refills may take up to 3 business days. We ask that you follow-up with your pharmacy.    Rx send to [facility]  [facility]

## 2024-11-05 ENCOUNTER — Ambulatory Visit: Admitting: Family Medicine

## 2024-11-05 ENCOUNTER — Encounter: Payer: Self-pay | Admitting: Family Medicine

## 2024-11-05 DIAGNOSIS — F4310 Post-traumatic stress disorder, unspecified: Secondary | ICD-10-CM | POA: Diagnosis not present

## 2024-11-05 DIAGNOSIS — F411 Generalized anxiety disorder: Secondary | ICD-10-CM

## 2024-11-05 DIAGNOSIS — I10 Essential (primary) hypertension: Secondary | ICD-10-CM | POA: Diagnosis not present

## 2024-11-05 DIAGNOSIS — K219 Gastro-esophageal reflux disease without esophagitis: Secondary | ICD-10-CM | POA: Diagnosis not present

## 2024-11-05 DIAGNOSIS — F339 Major depressive disorder, recurrent, unspecified: Secondary | ICD-10-CM

## 2024-11-05 DIAGNOSIS — F5102 Adjustment insomnia: Secondary | ICD-10-CM | POA: Diagnosis not present

## 2024-11-05 MED ORDER — LOSARTAN POTASSIUM 100 MG PO TABS: 100.0000 mg | ORAL_TABLET | Freq: Every day | ORAL | 3 refills | Status: AC

## 2024-11-05 MED ORDER — ESZOPICLONE 3 MG PO TABS: 3.0000 mg | ORAL_TABLET | Freq: Every evening | ORAL | 3 refills | Status: DC | PRN

## 2024-11-05 MED ORDER — CLONAZEPAM 1 MG PO TABS: 1.0000 mg | ORAL_TABLET | Freq: Two times a day (BID) | ORAL | 1 refills | Status: DC | PRN

## 2024-11-05 MED ORDER — WEGOVY 0.25 MG/0.5ML ~~LOC~~ SOAJ: 0.2500 mg | SUBCUTANEOUS | 0 refills | Status: AC

## 2024-11-05 MED ORDER — AMLODIPINE BESYLATE 5 MG PO TABS: 5.0000 mg | ORAL_TABLET | Freq: Every day | ORAL | 3 refills | Status: AC

## 2024-11-05 MED ORDER — GABAPENTIN 300 MG PO CAPS: 300.0000 mg | ORAL_CAPSULE | Freq: Three times a day (TID) | ORAL | 3 refills | Status: AC

## 2024-11-05 MED ORDER — PROPRANOLOL HCL ER 60 MG PO CP24: 60.0000 mg | ORAL_CAPSULE | Freq: Every day | ORAL | 3 refills | Status: AC

## 2024-11-05 MED ORDER — PRAZOSIN HCL 2 MG PO CAPS: 2.0000 mg | ORAL_CAPSULE | Freq: Every day | ORAL | 3 refills | Status: AC

## 2024-11-05 MED ORDER — PANTOPRAZOLE SODIUM 40 MG PO TBEC: 40.0000 mg | DELAYED_RELEASE_TABLET | Freq: Every day | ORAL | 3 refills | Status: AC

## 2024-11-05 NOTE — Progress Notes (Signed)
 "  Omar Krause is a 34 y.o. male who presents today for an office visit.  Assessment/Plan:   Chronic Problems Addressed Today: Morbid obesity (HCC) BMI 42.29 today with comorbidities.  He is interested in trial of GLP.  We did try this last year however insurance stopped covering this and he had to discontinue.  Previously did well with Wegovy .  Insurance has reinstated coverage and he would like to restart.  Will start Wegovy  0.25 mg weekly.  He will follow-up with us  in a few weeks we can adjust dose as needed.  Primary hypertension Blood pressure at goal today.  Typically well-controlled at home.  He will continue current regimen propranolol  60 mg daily, losartan  100 mg daily, and amlodipine  5 mg daily.  Depression, recurrent Stable continue management per psychiatry.  PTSD (post-traumatic stress disorder) Stable.  Continue management per psychiatry.  Work feels rested today as well.  Generalized anxiety disorder Overall symptoms are stable though he is worried about potential tolerance and dependency to Xanax .  He would like to switch back to clonazepam .  Will get 1 mg twice daily as needed.  He will follow-up with us  in a few weeks to let us  know how he is doing with so he is agreeable to it he would like to wean off of this at some point in the near future.  Insomnia Symptoms are not fully controlled.  He has had some success with finasteride though does think that a higher dose would be beneficial.  Will go to 3 mg nightly.  He will follow-up with us  in a few weeks.  He is aware to not take this with his clonazepam  or alprazolam .  GERD (gastroesophageal reflux disease) Stable on protonix  40 mg daily. We will refill today.      Subjective:  HPI:  See assessment / plan for status of chronic conditions.      Discussed the use of AI scribe software for clinical note transcription with the patient, who gave verbal consent to proceed.  History of Present Illness Omar Krause Omar Krause is a 34 year old male with hypertension and anxiety who presents for medication management and follow-up.  He is experiencing issues with his current medication regimen, particularly with alprazolam , which has led to physical dependency and minor withdrawal effects between doses. He has previously tried clonazepam , which he found to be more effective due to its longer action, and is considering switching back to it. He is currently living in a shelter and feels it is not the right time to attempt weaning.  He is seeking a 90-day supply for his medications where possible, including clonazepam , losartan , prazosin , amlodipine , gabapentin , Protonix , propranolol , and Lunesta . He is currently taking 60 mg of propranolol  and has been managing his psychiatric medications well, with 90-day supplies already arranged by his psychiatrist.  He is interested in starting Wegovy  for weight management, as Medicaid has reinstated coverage for it. He has previously gained weight, noting an increase from 301 lbs in December to 319 lbs in August, and attributes some of this to holiday eating.  He acknowledges a family history of hypertension, as his brother also has high blood pressure despite not being overweight. He continues to smoke, which he notes affects his blood pressure readings.  He is currently taking gabapentin , which he picks up regularly from the pharmacy, and Protonix . He is also using Lunesta  for sleep, but notes it is becoming less effective, possibly due to his dependency on Xanax . He is  considering increasing the dose to 3 mg.  Socially, he is in the process of improving his living situation, with a job interview scheduled for later today and plans to move into income-based housing in a month. 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         Objective:  Physical Exam: BP 138/88   Pulse 97   Temp (!) 97.2 F (36.2 C) (Temporal)   Ht 6'  (1.829 m)   Wt (!) 311 lb 12.8 oz (141.4 kg)   SpO2 97%   BMI 42.29 kg/m   Wt Readings from Last 3 Encounters:  11/05/24 (!) 311 lb 12.8 oz (141.4 kg)  09/21/24 (!) 301 lb 6.4 oz (136.7 kg)  06/12/24 (!) 319 lb (144.7 kg)    Gen: No acute distress, resting comfortably CV: Regular rate and rhythm with no murmurs appreciated Pulm: Normal work of breathing, clear to auscultation bilaterally with no crackles, wheezes, or rhonchi Neuro: Grossly normal, moves all extremities Psych: Normal affect and thought content      Infantof Villagomez M. Kennyth, MD 11/05/2024 11:05 AM  "

## 2024-11-05 NOTE — Assessment & Plan Note (Signed)
 Overall symptoms are stable though he is worried about potential tolerance and dependency to Xanax .  He would like to switch back to clonazepam .  Will get 1 mg twice daily as needed.  He will follow-up with us  in a few weeks to let us  know how he is doing with so he is agreeable to it he would like to wean off of this at some point in the near future.

## 2024-11-05 NOTE — Assessment & Plan Note (Signed)
 BMI 42.29 today with comorbidities.  He is interested in trial of GLP.  We did try this last year however insurance stopped covering this and he had to discontinue.  Previously did well with Wegovy .  Insurance has reinstated coverage and he would like to restart.  Will start Wegovy  0.25 mg weekly.  He will follow-up with us  in a few weeks we can adjust dose as needed.

## 2024-11-05 NOTE — Assessment & Plan Note (Signed)
 Stable.  Continue management per psychiatry.  Work feels rested today as well.

## 2024-11-05 NOTE — Assessment & Plan Note (Signed)
 Blood pressure at goal today.  Typically well-controlled at home.  He will continue current regimen propranolol  60 mg daily, losartan  100 mg daily, and amlodipine  5 mg daily.

## 2024-11-05 NOTE — Assessment & Plan Note (Signed)
Stable on protonix 40 mg daily. We will refill today.

## 2024-11-05 NOTE — Patient Instructions (Signed)
 It was very nice to see you today!  VISIT SUMMARY: During your visit, we discussed your current medication regimen and made adjustments to better manage your anxiety, weight, and blood pressure. We also addressed your living situation and upcoming job interview.  YOUR PLAN: GENERALIZED ANXIETY DISORDER: You have developed a physical dependence on alprazolam  and prefer clonazepam  for its longer half-life and ease of tapering. -Switch to clonazepam  for anxiety management and prescribe a 90-day supply, adjusting to 30 days if required by Medicare. -Increase Lunesta  to 3 mg due to reduced efficacy.  MORBID OBESITY: You are interested in Wegovy  for weight management, and your weight has increased to 301 lbs. -Initiate Wegovy  at a low dose and titrate as tolerated. -Follow up in a few weeks to assess progress and adjust dosage.  PRIMARY HYPERTENSION: Your home blood pressure averages 135/80 mmHg, and smoking cessation is considered to aid blood pressure management. -Continue losartan , amlodipine , and propranolol  at 60 mg. -Encourage smoking cessation and monitor blood pressure at home, maintaining a log.  GENERAL HEALTH MAINTENANCE: You are managing multiple medications while stabilizing your living situation and are actively seeking employment and housing. -Submit the [employer] form for [employer] -Send prescriptions for a 90-day supply where possible. -Follow up in a few weeks to assess medication efficacy and overall health.  Return if symptoms worsen or fail to improve.   Take care, Dr Kennyth  PLEASE NOTE:  If you had any lab tests, please let us  know if you have not heard back within a few days. You may see your results on mychart before we have a chance to review them but we will give you a call once they are reviewed by us .   If we ordered any referrals today, please let us  know if you have not heard from their office within the next week.   If you had any urgent  prescriptions sent in today, please check with the pharmacy within an hour of our visit to make sure the prescription was transmitted appropriately.   Please try these tips to maintain a healthy lifestyle:  Eat at least 3 REAL meals and 1-2 snacks per day.  Aim for no more than 5 hours between eating.  If you eat breakfast, please do so within one hour of getting up.   Each meal should contain half fruits/vegetables, one quarter protein, and one quarter carbs (no bigger than a computer mouse)  Cut down on sweet beverages. This includes juice, soda, and sweet tea.   Drink at least 1 glass of water with each meal and aim for at least 8 glasses per day  Exercise at least 150 minutes every week.

## 2024-11-05 NOTE — Assessment & Plan Note (Signed)
 Symptoms are not fully controlled.  He has had some success with finasteride though does think that a higher dose would be beneficial.  Will go to 3 mg nightly.  He will follow-up with us  in a few weeks.  He is aware to not take this with his clonazepam  or alprazolam .

## 2024-11-05 NOTE — Assessment & Plan Note (Signed)
 Stable continue management per psychiatry.

## 2024-11-06 ENCOUNTER — Encounter: Payer: Self-pay | Admitting: Family Medicine

## 2024-11-09 ENCOUNTER — Telehealth: Payer: Self-pay | Admitting: *Deleted

## 2024-11-09 NOTE — Telephone Encounter (Signed)
 Noted.

## 2024-11-09 NOTE — Telephone Encounter (Signed)
 Patient notified form [facility] ready to be pick up  Stated will call back with fax # to fax form  Copy mail to provider address

## 2024-11-10 NOTE — Telephone Encounter (Signed)
 Reason for CRM: pt called to give fax number of [facility] [phone]

## 2024-11-10 NOTE — Telephone Encounter (Signed)
 Form faxed to [phone]  Placed to be scan in Patient chart

## 2024-11-12 ENCOUNTER — Other Ambulatory Visit (HOSPITAL_COMMUNITY): Payer: Self-pay

## 2024-11-12 ENCOUNTER — Telehealth: Payer: Self-pay

## 2024-11-12 NOTE — Telephone Encounter (Signed)
 Pharmacy Patient Advocate Encounter   Received notification from CoverMyMeds that prior authorization for Wegovy  0.25mg /0.86ml is required/requested.   Insurance verification completed.   The patient is insured through [facility] MEDICAID.   Per test claim: PA required; PA submitted to above mentioned insurance via Latent Key/confirmation #/[id number] [facility] Status is pending

## 2024-11-12 NOTE — Telephone Encounter (Signed)
 Please advise.

## 2024-11-13 ENCOUNTER — Telehealth: Payer: Self-pay | Admitting: *Deleted

## 2024-11-13 NOTE — Telephone Encounter (Signed)
 Pharmacy Patient Advocate Encounter  Received notification from [facility] MEDICAID that Prior Authorization for Wegovy  0.25 mg/0.32ml has been DENIED.  See denial reason below. No denial letter attached in CMM. Will attach denial letter to Media tab once received.   PA #/Case ID/Reference #: [id number]

## 2024-11-13 NOTE — Telephone Encounter (Signed)
 Copied from CRM #[id number]. Topic: Clinical - Medication Question >> Nov 13, 2024 12:34 PM Shereese L wrote: Reason for CRM: Patient called in and stated that he wanted to know the status of his PA for  semaglutide -weight management (WEGOVY ) 0.25 MG/0.5ML SOAJ SQ injection   Spoke with patient, wegovy  denied  Omar Krause,RMA

## 2024-11-13 NOTE — Telephone Encounter (Signed)
 Patient notified Wegovy  0.25 mg/0.25ml has been DENIED.

## 2024-11-13 NOTE — Telephone Encounter (Signed)
 Patient notified   just switched his clonazepam  a week ago.  We should probably have another office visit to discuss before we switch again. Stated will schedule a f/u appt with PCP

## 2024-11-13 NOTE — Telephone Encounter (Signed)
 Copied from CRM #[id number]. Topic: Clinical - Medication Question >> Nov 13, 2024  1:34 PM Deaijah H wrote: Reason for CRM: Patient called in requesting to speak with Elora regarding conversation had with insurance about medication semaglutide -weight management (WEGOVY ) 0.25 MG/0.5ML SOAJ SQ injection. Please call.

## 2024-11-13 NOTE — Telephone Encounter (Signed)
 He just switched his clonazepam  a week ago.  We should probably have another office visit to discuss before we switch again.

## 2024-11-16 NOTE — Telephone Encounter (Signed)
 Duplicated

## 2024-11-17 ENCOUNTER — Ambulatory Visit: Admitting: Family Medicine

## 2024-11-19 ENCOUNTER — Ambulatory Visit: Admitting: Family Medicine

## 2024-11-20 ENCOUNTER — Ambulatory Visit (INDEPENDENT_AMBULATORY_CARE_PROVIDER_SITE_OTHER): Admitting: Family Medicine

## 2024-11-20 ENCOUNTER — Encounter: Payer: Self-pay | Admitting: Family Medicine

## 2024-11-20 VITALS — BP 138/90 | HR 58 | Temp 97.7°F | Wt 303.4 lb

## 2024-11-20 DIAGNOSIS — F411 Generalized anxiety disorder: Secondary | ICD-10-CM | POA: Diagnosis not present

## 2024-11-20 DIAGNOSIS — F17200 Nicotine dependence, unspecified, uncomplicated: Secondary | ICD-10-CM | POA: Diagnosis not present

## 2024-11-20 DIAGNOSIS — J4520 Mild intermittent asthma, uncomplicated: Secondary | ICD-10-CM

## 2024-11-20 DIAGNOSIS — F5102 Adjustment insomnia: Secondary | ICD-10-CM | POA: Diagnosis not present

## 2024-11-20 DIAGNOSIS — I10 Essential (primary) hypertension: Secondary | ICD-10-CM | POA: Diagnosis not present

## 2024-11-20 DIAGNOSIS — Z59819 Housing instability, housed unspecified: Secondary | ICD-10-CM | POA: Diagnosis not present

## 2024-11-20 MED ORDER — ZOLPIDEM TARTRATE 10 MG PO TABS: 10.0000 mg | ORAL_TABLET | Freq: Every evening | ORAL | 1 refills | Status: AC | PRN

## 2024-11-20 MED ORDER — LORAZEPAM 1 MG PO TABS: 1.0000 mg | ORAL_TABLET | Freq: Two times a day (BID) | ORAL | 3 refills | Status: AC | PRN

## 2024-11-20 MED ORDER — ALBUTEROL SULFATE HFA 108 (90 BASE) MCG/ACT IN AERS: 2.0000 | INHALATION_SPRAY | Freq: Four times a day (QID) | RESPIRATORY_TRACT | 1 refills | Status: AC | PRN

## 2024-11-20 MED ORDER — NICOTINE 7 MG/24HR TD PT24: 7.0000 mg | MEDICATED_PATCH | Freq: Every day | TRANSDERMAL | 0 refills | Status: AC

## 2024-11-20 MED ORDER — SEMAGLUTIDE-WEIGHT MANAGEMENT 0.5 MG/0.5ML ~~LOC~~ SOAJ: 0.5000 mg | SUBCUTANEOUS | 0 refills | Status: AC

## 2024-11-20 MED ORDER — SEMAGLUTIDE-WEIGHT MANAGEMENT 1.7 MG/0.75ML ~~LOC~~ SOAJ: 1.7000 mg | SUBCUTANEOUS | 0 refills | Status: AC

## 2024-11-20 MED ORDER — SEMAGLUTIDE-WEIGHT MANAGEMENT 1 MG/0.5ML ~~LOC~~ SOAJ: 1.0000 mg | SUBCUTANEOUS | 0 refills | Status: AC

## 2024-11-20 MED ORDER — SEMAGLUTIDE-WEIGHT MANAGEMENT 2.4 MG/0.75ML ~~LOC~~ SOAJ: 2.4000 mg | SUBCUTANEOUS | 0 refills | Status: AC

## 2024-11-20 MED ORDER — SEMAGLUTIDE-WEIGHT MANAGEMENT 0.25 MG/0.5ML ~~LOC~~ SOAJ: 0.2500 mg | SUBCUTANEOUS | 0 refills | Status: AC

## 2024-11-20 NOTE — Assessment & Plan Note (Signed)
 Initially elevated today though he did recently resume nicotine  use which is probably contributing to this.  Blood pressure was at goal at his most recent office visit here.  We will use current regimen propranolol  60 mg daily, losartan  100 mg daily, and amlodipine  5 mg daily.  He will monitor at home and follow-up with us  in a few weeks

## 2024-11-20 NOTE — Progress Notes (Signed)
 "  Omar Krause is a 34 y.o. male who presents today for an office visit.  Assessment/Plan:  Chronic Problems Addressed Today: Morbid obesity (HCC) We started Wegovy  a few weeks ago.  He has taken 1 dose with this doing well so far.  He will continue to escalate the dose every 4 weeks.  He will follow-up with us  in a few weeks to let us  know how he is doing.  Discussed lifestyle modifications.  Primary hypertension Initially elevated today though he did recently resume nicotine  use which is probably contributing to this.  Blood pressure was at goal at his most recent office visit here.  We will use current regimen propranolol  60 mg daily, losartan  100 mg daily, and amlodipine  5 mg daily.  He will monitor at home and follow-up with us  in a few weeks  Generalized anxiety disorder Overall symptoms are stable though he has not had a good response to clonazepam .  We switched him from alprazolam  to clonazepam  a few weeks ago due to his concern about developing tolerance and dependency to the alprazolam .  He would like to switch to lorazepam  as he has done well with this in the past.  Database was reviewed today without red flags.  Did discuss with patient he would need to change his course of clonazepam  before picking up the new prescription.  Will switch to lorazepam  1 mg twice daily and he will follow-up with us  in a few weeks  Insomnia We switched him to Lunesta  3 mg nightly few weeks ago however he does not feel like this has been effective.  He would like to go back to Ambien .  Did have some sleepwalking issues with this in the past but would like to try it again.  They recently received Ambien  while in the emergency room a couple weeks ago and did well with it then.  He will reach out to us  in a few weeks via MyChart to let us  know how he is doing with this.  He is aware to not take this with clonazepam  or lorazepam .  Nicotine  dependence with current use Patient was asked about his tobacco use  today and was strongly advised to quit. Patient is currently contemplative. We reviewed treatment options to assist him quit smoking including NRT, Chantix, and Bupropion .  He is already on Wellbutrin  and is hesitant to start Chantix due to side effects.  Will try nicotine  patch follow up at next office visit.   Total time spent counseling approximately 3 minutes.    Asthma, mild intermittent Albuterol  inhaler refilled.  Housing instability Patient was recently kicked out of his homeless shelter and is currently staying with a friend.  Will place referral to social work to help him with housing issues.    Subjective:  HPI:  See assessment / plan for status of chronic conditions.   Discussed the use of AI scribe software for clinical note transcription with the patient, who gave verbal consent to proceed.  History of Present Illness Omar Krause is a 34 year old male who presents for medication management and social support following a recent hospitalization.  He is currently on Wegovy  for weight management, having started with one dose and following a weekly dosing schedule without serious side effects.  He is taking clonazepam  for anxiety but finds it ineffective and wishes to switch to lorazepam , which he has used before with good effect and no side effects.  He was recently hospitalized after using dextromethorphan in an  attempt to reach a 'part of himself' and was subsequently kicked out of a shelter. He is currently staying with a friend. Denies any suicidal intent.  No active SI or HI.  During the hospital stay, he was given Ambien  instead of his usual Lunesta  and found it effective without major side effects. He requests to switch back to Ambien  as he feels tolerant to Lunesta .  His blood pressure has been elevated at home, with systolic readings around 150 mmHg. He smokes two to three cigarettes a day and is considering using nicotine  patches to quit.  He requires  a refill for his albuterol  inhaler as he is running low.  He requested a new referral for social work support after his previous case was closed. He is awaiting feedback from a job interview and is concerned about the impact of recent weather conditions on the process.  He initially felt down and depressed after being kicked out of the shelter but has since improved. No current mood disturbances.         Objective:  Physical Exam: BP (!) 138/90   Pulse (!) 58   Temp 97.7 F (36.5 C) (Temporal)   Wt (!) 303 lb 6.4 oz (137.6 kg)   SpO2 97%   BMI 41.15 kg/m   Gen: No acute distress, resting comfortably CV: Regular rate and rhythm with no murmurs appreciated Pulm: Normal work of breathing, clear to auscultation bilaterally with no crackles, wheezes, or rhonchi Neuro: Grossly normal, moves all extremities Psych: Normal affect and thought content      Jaksen Fiorella M. Kennyth, MD 11/20/2024 1:58 PM  "

## 2024-11-20 NOTE — Assessment & Plan Note (Signed)
 We switched him to Lunesta  3 mg nightly few weeks ago however he does not feel like this has been effective.  He would like to go back to Ambien .  Did have some sleepwalking issues with this in the past but would like to try it again.  They recently received Ambien  while in the emergency room a couple weeks ago and did well with it then.  He will reach out to us  in a few weeks via MyChart to let us  know how he is doing with this.  He is aware to not take this with clonazepam  or lorazepam .

## 2024-11-20 NOTE — Assessment & Plan Note (Signed)
 Overall symptoms are stable though he has not had a good response to clonazepam .  We switched him from alprazolam  to clonazepam  a few weeks ago due to his concern about developing tolerance and dependency to the alprazolam .  He would like to switch to lorazepam  as he has done well with this in the past.  Database was reviewed today without red flags.  Did discuss with patient he would need to change his course of clonazepam  before picking up the new prescription.  Will switch to lorazepam  1 mg twice daily and he will follow-up with us  in a few weeks

## 2024-11-20 NOTE — Assessment & Plan Note (Signed)
 Patient was asked about his tobacco use today and was strongly advised to quit. Patient is currently contemplative. We reviewed treatment options to assist him quit smoking including NRT, Chantix, and Bupropion .  He is already on Wellbutrin  and is hesitant to start Chantix due to side effects.  Will try nicotine  patch follow up at next office visit.   Total time spent counseling approximately 3 minutes.

## 2024-11-20 NOTE — Patient Instructions (Signed)
 It was very nice to see you today!  VISIT SUMMARY: Today, we discussed your medication management and social support following your recent hospitalization. We addressed your weight management, anxiety, insomnia, high blood pressure, smoking cessation, and asthma. We also provided a new referral for social work support.  YOUR PLAN: MORBID OBESITY: You are currently on Wegovy  for weight management without significant side effects. -Continue Wegovy  with weekly dose titration as tolerated. Your prescription for the next dose has been sent.  PRIMARY HYPERTENSION: Your blood pressure is elevated, possibly due to smoking. -Smoking cessation is advised. Your blood pressure was checked before you left the clinic.  GENERALIZED ANXIETY DISORDER: Clonazepam  is ineffective for your anxiety. -We have switched you to lorazepam  due to your previous positive response. A message regarding the dose change was sent to the pharmacy.  INSOMNIA: You are tolerant to Lunesta  and found Ambien  effective without adverse effects. -We have switched you to Ambien . Avoid using Ambien  and lorazepam  together due to the risk of respiratory depression.  NICOTINE  DEPENDENCE, CIGARETTES: You smoke 2-3 cigarettes per day and are interested in quitting. -Nicotine  patches are prescribed for smoking cessation. We discussed the potential use of Chantix, noting potential mood disturbances.  ASTHMA: You are running low on your albuterol  inhaler. -Your albuterol  inhaler has been refilled.  SOCIAL SUPPORT: You requested a new referral for social work support after your previous case was closed. -A new referral for social work support has been provided.  Return in about 3 months (around 02/18/2025) for Follow Up.   Take care, Dr Kennyth  PLEASE NOTE:  If you had any lab tests, please let us  know if you have not heard back within a few days. You may see your results on mychart before we have a chance to review them but we will give  you a call once they are reviewed by us .   If we ordered any referrals today, please let us  know if you have not heard from their office within the next week.   If you had any urgent prescriptions sent in today, please check with the pharmacy within an hour of our visit to make sure the prescription was transmitted appropriately.   Please try these tips to maintain a healthy lifestyle:  Eat at least 3 REAL meals and 1-2 snacks per day.  Aim for no more than 5 hours between eating.  If you eat breakfast, please do so within one hour of getting up.   Each meal should contain half fruits/vegetables, one quarter protein, and one quarter carbs (no bigger than a computer mouse)  Cut down on sweet beverages. This includes juice, soda, and sweet tea.   Drink at least 1 glass of water with each meal and aim for at least 8 glasses per day  Exercise at least 150 minutes every week.

## 2024-11-20 NOTE — Assessment & Plan Note (Signed)
 We started Wegovy  a few weeks ago.  He has taken 1 dose with this doing well so far.  He will continue to escalate the dose every 4 weeks.  He will follow-up with us  in a few weeks to let us  know how he is doing.  Discussed lifestyle modifications.

## 2024-11-20 NOTE — Assessment & Plan Note (Signed)
 Albuterol  inhaler refilled

## 2024-12-03 ENCOUNTER — Ambulatory Visit (HOSPITAL_COMMUNITY): Payer: Self-pay | Admitting: Licensed Clinical Social Worker

## 2025-02-19 ENCOUNTER — Ambulatory Visit: Admitting: Family Medicine
